# Patient Record
Sex: Female | Born: 1960 | Race: Black or African American | Hispanic: No | Marital: Single | Smoking: Current some day smoker
Health system: Southern US, Community
[De-identification: ages and names within clinical notes are randomized; demographics above are authoritative.]

## PROBLEM LIST (undated history)

## (undated) DIAGNOSIS — I10 Essential (primary) hypertension: Secondary | ICD-10-CM

## (undated) DIAGNOSIS — J40 Bronchitis, not specified as acute or chronic: Secondary | ICD-10-CM

## (undated) DIAGNOSIS — R51 Headache: Secondary | ICD-10-CM

## (undated) DIAGNOSIS — J449 Chronic obstructive pulmonary disease, unspecified: Secondary | ICD-10-CM

## (undated) DIAGNOSIS — K649 Unspecified hemorrhoids: Secondary | ICD-10-CM

## (undated) DIAGNOSIS — F329 Major depressive disorder, single episode, unspecified: Secondary | ICD-10-CM

## (undated) DIAGNOSIS — F319 Bipolar disorder, unspecified: Secondary | ICD-10-CM

## (undated) DIAGNOSIS — F1410 Cocaine abuse, uncomplicated: Secondary | ICD-10-CM

## (undated) DIAGNOSIS — B1920 Unspecified viral hepatitis C without hepatic coma: Secondary | ICD-10-CM

## (undated) DIAGNOSIS — F32A Depression, unspecified: Secondary | ICD-10-CM

## (undated) DIAGNOSIS — R519 Headache, unspecified: Secondary | ICD-10-CM

## (undated) HISTORY — PX: OTHER SURGICAL HISTORY: SHX169

## (undated) HISTORY — DX: Unspecified hemorrhoids: K64.9

---

## 2001-07-25 ENCOUNTER — Observation Stay (HOSPITAL_COMMUNITY): Admission: EM | Admit: 2001-07-25 | Discharge: 2001-07-26 | Payer: Self-pay | Admitting: Internal Medicine

## 2002-05-28 ENCOUNTER — Emergency Department (HOSPITAL_COMMUNITY): Admission: EM | Admit: 2002-05-28 | Discharge: 2002-05-28 | Payer: Self-pay | Admitting: *Deleted

## 2003-03-23 ENCOUNTER — Inpatient Hospital Stay (HOSPITAL_COMMUNITY): Admission: EM | Admit: 2003-03-23 | Discharge: 2003-03-25 | Payer: Self-pay | Admitting: Emergency Medicine

## 2003-03-24 ENCOUNTER — Encounter: Payer: Self-pay | Admitting: *Deleted

## 2003-08-17 ENCOUNTER — Emergency Department (HOSPITAL_COMMUNITY): Admission: EM | Admit: 2003-08-17 | Discharge: 2003-08-17 | Payer: Self-pay | Admitting: Emergency Medicine

## 2003-10-19 ENCOUNTER — Emergency Department (HOSPITAL_COMMUNITY): Admission: EM | Admit: 2003-10-19 | Discharge: 2003-10-19 | Payer: Self-pay | Admitting: Emergency Medicine

## 2004-03-03 ENCOUNTER — Ambulatory Visit (HOSPITAL_COMMUNITY): Admission: AD | Admit: 2004-03-03 | Discharge: 2004-03-03 | Payer: Self-pay | Admitting: Family Medicine

## 2004-03-04 ENCOUNTER — Ambulatory Visit (HOSPITAL_COMMUNITY): Admission: EM | Admit: 2004-03-04 | Discharge: 2004-03-04 | Payer: Self-pay | Admitting: Emergency Medicine

## 2004-04-04 ENCOUNTER — Ambulatory Visit (HOSPITAL_COMMUNITY): Admission: RE | Admit: 2004-04-04 | Discharge: 2004-04-04 | Payer: Self-pay | Admitting: Obstetrics and Gynecology

## 2004-07-18 ENCOUNTER — Emergency Department (HOSPITAL_COMMUNITY): Admission: EM | Admit: 2004-07-18 | Discharge: 2004-07-18 | Payer: Self-pay | Admitting: Emergency Medicine

## 2007-07-14 ENCOUNTER — Emergency Department (HOSPITAL_COMMUNITY): Admission: EM | Admit: 2007-07-14 | Discharge: 2007-07-14 | Payer: Self-pay | Admitting: Emergency Medicine

## 2008-02-05 ENCOUNTER — Emergency Department (HOSPITAL_COMMUNITY): Admission: EM | Admit: 2008-02-05 | Discharge: 2008-02-05 | Payer: Self-pay | Admitting: Emergency Medicine

## 2008-04-05 ENCOUNTER — Emergency Department (HOSPITAL_COMMUNITY): Admission: EM | Admit: 2008-04-05 | Discharge: 2008-04-05 | Payer: Self-pay | Admitting: Emergency Medicine

## 2008-04-08 ENCOUNTER — Emergency Department (HOSPITAL_COMMUNITY): Admission: EM | Admit: 2008-04-08 | Discharge: 2008-04-08 | Payer: Self-pay | Admitting: Emergency Medicine

## 2008-04-12 ENCOUNTER — Emergency Department (HOSPITAL_COMMUNITY): Admission: EM | Admit: 2008-04-12 | Discharge: 2008-04-12 | Payer: Self-pay | Admitting: Emergency Medicine

## 2008-04-15 ENCOUNTER — Emergency Department (HOSPITAL_COMMUNITY): Admission: EM | Admit: 2008-04-15 | Discharge: 2008-04-15 | Payer: Self-pay | Admitting: Emergency Medicine

## 2008-08-09 ENCOUNTER — Emergency Department (HOSPITAL_COMMUNITY): Admission: EM | Admit: 2008-08-09 | Discharge: 2008-08-09 | Payer: Self-pay | Admitting: Emergency Medicine

## 2009-03-28 ENCOUNTER — Emergency Department (HOSPITAL_COMMUNITY): Admission: EM | Admit: 2009-03-28 | Discharge: 2009-03-28 | Payer: Self-pay | Admitting: Emergency Medicine

## 2010-07-27 ENCOUNTER — Emergency Department (HOSPITAL_COMMUNITY): Admission: EM | Admit: 2010-07-27 | Discharge: 2010-07-27 | Payer: Self-pay | Admitting: Emergency Medicine

## 2011-02-03 LAB — URINE MICROSCOPIC-ADD ON

## 2011-02-03 LAB — URINE CULTURE

## 2011-02-03 LAB — URINALYSIS, ROUTINE W REFLEX MICROSCOPIC
Bilirubin Urine: NEGATIVE
Glucose, UA: NEGATIVE mg/dL
Ketones, ur: NEGATIVE mg/dL
Protein, ur: 30 mg/dL — AB

## 2011-03-01 LAB — URINALYSIS, ROUTINE W REFLEX MICROSCOPIC
Glucose, UA: NEGATIVE mg/dL
Protein, ur: NEGATIVE mg/dL
Specific Gravity, Urine: 1.025 (ref 1.005–1.030)
pH: 5.5 (ref 5.0–8.0)

## 2011-03-01 LAB — URINE MICROSCOPIC-ADD ON

## 2011-04-08 NOTE — H&P (Signed)
NAME:  Becky [name], Becky [name]                          ACCOUNT NO.:  [id number]   MEDICAL RECORD NO.:  [id number]                   PATIENT TYPE:  INP   LOCATION:  A339                                 FACILITY:  [facility]   PHYSICIAN:  Sarita Bottom, M.D.                  DATE OF BIRTH:  Jan 04, 1961   DATE OF ADMISSION:  03/22/2003  DATE OF DISCHARGE:                                HISTORY & PHYSICAL   PRIMARY CARE PHYSICIAN:  Mila Homer. Sudie Bailey, M.D.   CHIEF COMPLAINT:  I feel weak and I've been throwing up.   HISTORY OF PRESENT ILLNESS:  The patient is a 50 year old female with no  significant medical illness was well until about two days ago when she  developed an insidious onset of malaise, fevers, and chills.  She also  vomited about six times yesterday.  Vomitus was nonbloody, bilious in  nature, contained previously eaten food.  She therefore came to the  emergency room for evaluation and treatment.   REVIEW OF SYSTEMS:  She admits to a fever of 105.  Denies any weight loss.  General malaise.  RESPIRATORY:  She admits to on and off cough productive of  greenish sputum.  She has on and off shortness of breath.  CVS:  She has no  chest pain or palpitations.  GI:  Admits to nausea, vomiting.  Denies any  diarrhea.  GENITOURINARY:  She denies any dysuria.  PSYCHIATRIC:  Denies any  depression.   PAST MEDICAL HISTORY:  She has no chronic medical illness.  She has had an  HIV test that was negative four months ago when she was having antenatal  care.  PPD was also negative a few months ago.   SOCIAL HISTORY:  She is single.  She has two children.  She has a boyfriend.  She smokes about 1-2 cigarettes a day.  Denies alcohol abuse.   FAMILY HISTORY:  Significant for heart disease in her mother who died from a  heart attack at the age of 69.   PHYSICAL EXAMINATION:  GENERAL:  She is a young lady, ill looking but not in  any apparent distress.  VITAL SIGNS:  Temperature 104.  HEENT:  She  is not pale.  She is anicteric.  Oral mucosa is dry.  NECK:  Supple.  No jugular venous distention, no lymphadenopathy.  CHEST:  Air entry is good bilaterally.  Breath sounds are vesicular.  CARDIOVASCULAR:  Heart sounds 1 and 2 are normal.  Rhythm is regular.  No  murmurs were heard.  ABDOMEN:  Soft.  Bowel sounds are present.  The right renal angle is tender  to palpation.  CENTRAL NERVOUS SYSTEM:  She is alert and oriented x3.  She has no focal  deficits.  EXTREMITIES:  She has no edema.   LABORATORY DATA AND DIAGNOSTICS:  WBC is 14.3, neutrophil count is 70%,  lymphocytes are  4%, hemoglobin is 13.4, hematocrit is 38.9, MCV is 87.4,  platelet count is 165.  Her UA shows a specific gravity of 1010.  Urine is  yellow, pH is 6.5, nitrite and leukocyte esterase are both positive.  She  has bacteria and pyuria.  Sodium is 134, potassium is 3.2, chloride is 102,  CO2 is 25, BUN is 6, creatinine is 0.8, glucose is 128, calcium is 8.2.  Liver function tests show a total protein of 6.3, SGOT of 50, SGPT of 61,  total bilirubin of 1.1.   ASSESSMENT AND PLAN:  1. Urinary tract infection with possible pyelonephritis.  Blood cultures and     urine cultures will be done.  Schedule the patient for a renal     ultrasonography.  The patient will be given intravenous Levaquin 500     daily and acetaminophen 500 q.6h. p.r.n. for fever and pain.  2. Nausea and vomiting.  The patient will be given Phenergan 12.5 mg q.6h.     She will also be given intravenous hydration, normal saline at 100 mL per     hour.  3. Hypokalemia.  This will be replenished with intravenous KCl 10 mEq x4     doses.  Her BMET will be monitored after replenishing potassium.  4. Elevated transaminases--question significance.  The patient's viral     hepatitis profile will be sent at this time and transaminases will be     monitored.   The patient to be admitted under the care of primary M.D., Dr. Sudie Bailey.  Further workup and  management will depend on clinical course.                                               Sarita Bottom, M.D.    DW/MEDQ  D:  03/23/2003  T:  03/23/2003  Job:  [id number]

## 2011-04-08 NOTE — Group Therapy Note (Signed)
   NAME:  Becky [name], Becky [name]                          ACCOUNT NO.:  [id number]   MEDICAL RECORD NO.:  [id number]                   PATIENT TYPE:  INP   LOCATION:  A339                                 FACILITY:  [facility]   PHYSICIAN:  Mila Homer. Sudie Bailey, M.D.           DATE OF BIRTH:  [birth year]   DATE OF PROCEDURE:  DATE OF DISCHARGE:                                   PROGRESS NOTE   SUBJECTIVE:  This 50 year old was admitted last night with presumptive  pyelonephritis. She tells me this morning that she has been having fever and  vomiting for a week.  She is still having pain in the right back.   OBJECTIVE:  Temperature 104.7, pulse 89, respiratory rate 20, blood pressure  138/79.  She appears to be oriented and alert, in distress due to the back  pain and the nausea and vomiting. She is well-developed and well-nourished.  Lungs are clear throughout.  Heart is irregular with a rate of about 70, in  my examine.  Abdomen is soft without hepatosplenomegaly or masses. There is  some tenderness in the right upper quadrant with some right flank and CVA  pain; none in the left.  Skin turgor appears to be normal and there is no  edema of the ankles.   Review of her lab work showed last night on admission the white cell count  was 14,300 with 12 bands, and 70 neutrophils.  Today the MET-7 showed a  potassium of 3.8, up from 3.2 on admission last night, after IV potassium.  Glucose is 123.   ASSESSMENT:  1. Presumptive pyelonephritis involving the right kidney.  2. Hypokalemia, improved.  3. Mild dehydration, improved.   PLAN:  Continue the Levaquin 500 mg IV q.24h.  Add at least 1 dose of  Rocephin 1 gm IV while waiting for urine cultured (cover proteus, etc.).  Continue with Tylenol 650 q.4h. but add ibuprofen 800 mg q.8h. to get the  fever down.                                               Mila Homer. Sudie Bailey, M.D.    SDK/MEDQ  D:  03/23/2003  T:  03/23/2003  Job:  [id number]

## 2011-04-08 NOTE — Discharge Summary (Signed)
   NAME:  Becky [name], Becky [name]                          ACCOUNT NO.:  [id number]   MEDICAL RECORD NO.:  [id number]                   PATIENT TYPE:  INP   LOCATION:  A339                                 FACILITY:  [facility]   PHYSICIAN:  Mila Homer. Sudie Bailey, M.D.           DATE OF BIRTH:  31-Jul-1961   DATE OF ADMISSION:  03/23/2003  DATE OF DISCHARGE:  03/25/2003                                 DISCHARGE SUMMARY   HISTORY OF PRESENT ILLNESS:  This 50 year old woman was admitted to the  hospital with pyelonephritis.  She had a benign three day hospitalization  extending from May 2 through Mar 25, 2003.  Vital signs remained stable but  initially temperature got as high as 105 before defervescing by her second  day.   Her admission white cell count is 14,300, rechecked 8300 her third day.  Her  H&H was 13.2/38.9, rechecked 11.4/34.3 on IV fluids.  She had initial sodium  of 134, recheck 135.  Potassium 3.2, recheck 3.8, glucose 128, recheck 123  and 111.  Her AST was 50 and ALT 61 on admission and three days later SGPT  was down to 42.  Albumin was 2.1 on IV fluids down from 3.0 on admission.   Her UA showed 7-10 wbc's, 0-2 rbc's, and many bacteria.  The stick showed  urine to be positive for nitrites, trace leukocytes.  Blood cultures x2 were  negative.  Urine culture was apparently not done.   Treatment included Levaquin 500 mg IV daily, Phenergan 12.5 mg q.6h. p.r.n.  nausea, IV normal saline at 125 mL/hour with KCl 10 mEq four doses by IV.  Later her potassium switched to 40 mEq KCl.  When she stayed febrile she was  also put on Rocephin 1 g IV for a one time dose.  She is on Robitussin two  teaspoons q.4h. for cough.   She did well on this regimen her second day, much better, and by third day  she was ready for discharge home.   She is discharged home on Levaquin 500 mg daily (sample which she is going  to get from our office).  She is to have follow-up with [place] within the week.   FINAL  DISCHARGE DIAGNOSES:  Pyelonephritis.                                               Mila Homer. Sudie Bailey, M.D.    SDK/MEDQ  D:  03/25/2003  T:  03/25/2003  Job:  [id number]

## 2011-04-08 NOTE — Discharge Summary (Signed)
NAME:  Becky [name], Becky [name]                          ACCOUNT NO.:  [id number]   MEDICAL RECORD NO.:  [id number]                   PATIENT TYPE:  OIB   LOCATION:  A415                                 FACILITY:  [facility]   PHYSICIAN:  Langley Gauss, M.D.                DATE OF BIRTH:  1961-05-06   DATE OF ADMISSION:  04/04/2004  DATE OF DISCHARGE:  04/04/2004                                 DISCHARGE SUMMARY   HISTORY OF PRESENT ILLNESS:  The patient is a 50 year old, gravida 5, para 2-  0-0-2, who is a 33-3/[redacted] weeks gestation based on a 28-4/7 weeks ultrasound,  who is transferred from [facility] to [place] with findings of  preterm premature spontaneous rupture of membranes at 2000 hours on Apr 03, 2004.  The patient gives a history of getting up to use the bathroom last  night and then having fluid run down her legs.  She is absolutely certain  she did not pee on herself at that time as the fluid leakage continued  throughout the evening.  She is again at the time of evaluation here noted  to have fluid running down her leg.  The patient states that she did not  come to the hospital last night as she was afraid.  She presented today only  upon her family's insistence.  Her prenatal course is complicated by  findings of insufficient and late prenatal care, as well as cocaine use.  The patient has had prenatal care only through labor and delivery visits.  She initially presented to [facility] on March 03, 2004,  complaining of abdominal pain.  At that time, she was an OB unassigned  patient.  She became irritated in that she was not allowed to eat, so she  let against medical advice.  She apparently did test positive for cocaine  and THC on that visit.  The patient subsequently presented the very  following day on March 04, 2004, again complaining of abdominal pain.  Limited workup at that time revealed intrauterine pregnancy at 28-4/[redacted] weeks  gestation per the [facility]  Radiology Department.  In addition, laboratory  studies revealed rubella immune.  Hepatitis B and HIV are negative.  RPR was  reactive with a titer of 1:4.  O positive blood type.  The patient was  determined not to be in labor.  She had a reactive nonstress test.  She was  discharged from the hospital on March 04, 2004, and given explicit  instructions to follow up for outpatient care at my office within the next  week.  The patient failed to keep any additional appointments and presented  today only, Apr 04, 2004, to [facility].   PAST MEDICAL HISTORY:  The patient has two prior vaginal deliveries in 1997  at [facility].  She also delivered 15 months ago at [facility].  At  that time, she was in a cocaine rehabilitation program.  The  patient has a previous 18-week pregnancy loss with spontaneous rupture of  membranes on July 25, 2001.  She subsequently had evacuation of  uterine  contents vaginally of this 18-week abortus.  She was recently hospitalized  on Mar 23, 2003, for pyelonephritis.  The patient has had long-term struggle  with cocaine use, testing positive for cocaine during just about every  evaluation and admission to labor and delivery.  Frequently she will smoke a  combination of marijuana and cocaine.  She also frequently ingests crack  cocaine-containing products.   CURRENT MEDICATIONS:  None other than recreational drugs.   ALLERGIES:  She has no known drug allergies.   PAST SURGICAL HISTORY:  Negative.   PHYSICAL EXAMINATION:  VITAL SIGNS:  Temperature 98.8 degrees, pulse 78,  respiratory rate 18, blood pressure 116/73.  HEENT:  Negative with no adenopathy.  Extremely poor dentition.  NECK:  Supple.  GENERAL APPEARANCE:  She appears much greater than her stated age.  LUNGS:  Clear.  CARDIOVASCULAR:  Regular rate and rhythm.  ABDOMEN:  Soft.  Gravid uterus identified with a fundal height of 30 cm.  EXTREMITIES:  Normal.  PELVIC:  No  evidence of leaking fluid.  A sterile speculum examination is  performed, which does reveal some pooling within the posterior vaginal  fornix.  Cervix noted to be visually not dilated.  Digital examination not  performed.  Limited OB ultrasound at greater than [redacted] weeks gestation per  Langley Gauss, M.D., reveals markedly decreased amniotic fluid.  No pockets  of fluid are identified that do not contain umbilical cord.  Good fetal  movement is identified.  Fetal cardiac activity is seen.  The infant is  noted to be in a breech presentation with the buttocks apparently well  applied to the cervix.  The placenta is noted to be posterior fundal in  position.  Anatomic survey is not performed.   LABORATORY STUDIES:  Urine drug screen is currently pending.  External fetal  monitor reveals reassuring fetal heart rate with fetal heart rate baseline  at 150.  Accelerations are noted.  No fetal heart rate decelerations are  noted.  Long-term variability is noted to be normal.  There is no evidence  of any uterine activity identified.   ASSESSMENT:  1. 33-3/7 weeks intrauterine pregnancy.  2. Breech presentation.  3. Preterm premature spontaneous rupture of membranes prior to the onset of     labor.  4. Noted to be in a breech presentation.  5. History of inadequate prenatal care.  6. Has tested positive for cocaine previously on March 04, 2004.  Testing in     progress at this time, but it would not be surprising if the patient     again tested positive for cocaine.   Due to the prematurity, it would be adventitious for a level 3 nursery  facility to be available.  Thus, arrangement is made for transfer to  [facility].  The patient is to be treated with 2 g of IV ampicillin due to the  high risk of group B Streptococcus.  In addition, she is given 12 mg of IV  betamethasone in an effort to enhance fetal lung maturity.  Transfer  arrangements are made with Dr. Sibyl Parr at [facility].    ___________________________________________  Langley Gauss, M.D.   DC/MEDQ  D:  04/04/2004  T:  04/04/2004  Job:  [id number]   cc:   [facility] Labor and Delivery  Fax [phone]

## 2011-04-08 NOTE — Discharge Summary (Signed)
   NAME:  Becky [name], Becky [name]                          ACCOUNT NO.:  [id number]   MEDICAL RECORD NO.:  [id number]                   PATIENT TYPE:  INP   LOCATION:  A339                                 FACILITY:  [facility]   PHYSICIAN:  Mila Homer. Sudie Bailey, M.D.           DATE OF BIRTH:  04-23-1961   DATE OF ADMISSION:  03/22/2003  DATE OF DISCHARGE:  03/25/2003                                 DISCHARGE SUMMARY   ADDENDUM:   DIAGNOSES:  1. Pyelonephritis.  2. Electrolyte abnormalities, including hyponatremia and hypokalemia.  3. Abnormalities of liver function tests.  4. Anemia.   LABORATORY DATA:  Renal ultrasound was negative.                                               Mila Homer. Sudie Bailey, M.D.    SDK/MEDQ  D:  03/25/2003  T:  03/25/2003  Job:  [id number]

## 2011-04-08 NOTE — Group Therapy Note (Signed)
   NAME:  Becky [name], Becky [name]                          ACCOUNT NO.:  [id number]   MEDICAL RECORD NO.:  [id number]                   PATIENT TYPE:  INP   LOCATION:  A339                                 FACILITY:  [facility]   PHYSICIAN:  Mila Homer. Sudie Bailey, M.D.           DATE OF BIRTH:  Oct 01, 1961   DATE OF PROCEDURE:  DATE OF DISCHARGE:                                   PROGRESS NOTE   SUBJECTIVE:  She says she feels bad today.  She has been eating.   OBJECTIVE:  Temperature 97.5, pulse 68, respirations 20, blood pressure  133/82.  She is semirecumbent in bed.  No acute distress.  Well-developed,  well-nourished.  Mucous membranes are moist.  Heart has regular rhythm at  rate of 70.  Lungs are clear throughout.  She is moving air well.  The  abdomen is soft without hepatosplenomegaly or mass.  She does have bilateral  CV and flank tenderness and palpation, however.  There is no edema to the  ankles.  Renal ultrasound done this morning was negative.  Serum pregnancy  test was negative.  Blood cultures x2 at this time are negative.   ASSESSMENT:  1. Pyelonephritis.  2. Hypokalemia which is now resolved (Last potassium 3.8 up from 3.2.).  3. LFTs possibly secondary to ETOH.   PLAN:  Urine culture pending.  Hepatitis C by PCR pending.  Repeat CBC  tomorrow.  If afebrile, with normal white cell count and free of CV and  flank pain, may be discharged.                                               Mila Homer. Sudie Bailey, M.D.    SDK/MEDQ  D:  03/24/2003  T:  03/25/2003  Job:  [id number]

## 2011-04-08 NOTE — Discharge Summary (Signed)
NAME:  Becky [name], Becky [name]                          ACCOUNT NO.:  [id number]   MEDICAL RECORD NO.:  [id number]                   PATIENT TYPE:  OIB   LOCATION:  A428                                 FACILITY:  [facility]   PHYSICIAN:  Langley Gauss, M.D.                DATE OF BIRTH:  09/22/1961   DATE OF ADMISSION:  03/04/2004  DATE OF DISCHARGE:  03/04/2004                                 DISCHARGE SUMMARY   The patient is a 50 year old multiparous black female who presents to [facility] emergency room with no prenatal care to date complaining of bleeding  which she feels is secondary to a hemorrhoid. The patient states she has  prior history of hemorrhoid with intermittent bleeding as a result of this.  However, she denies any prior history of any thrombosed hemorrhoids. She has  never had any surgical procedure performed. The patient was aware that she  was about four or five months pregnant. She denies any vaginal bleeding, any  uterine contractions. She does experience good fetal movement. The patient  has had no prenatal care to date. She has been seen at the health department  times one. She did present to [facility] the day previously on  March 03, 2004, at which time she left against medical advice. She is known  to be gravida 4, para 2. She has two prior vaginal deliveries with living  children.   The patient is somewhat of an unreliable historian not always truthful in  providing answers. She denies any prior history of PID or sexually  transmitted diseases. She has had two vaginal deliveries. She has had two  prior spontaneous ABs. Her last menstrual period is unknown. She states she  has no known drug allergies. She states current medications are none.  Then  later she says she is taking ABC+ multivitamin.  Medical history is  otherwise negative.   SOCIAL HISTORY:  The patient is noted to be a known cocaine user, though she  did deny drug or alcohol use upon  presentation on today's visit. I did care  for patient during her last pregnancy at which time she was known to  initially test positive for cocaine, but after aggressive counseling, the  patient subsequently did remain negative for cocaine throughout the  remainder of the pregnancy. When notified the patient that she did test  positive for cocaine on this presentation, the patient again denies cocaine  usage, then provides the story that one day previously a visitor had come  with a blunt, typically a marijuana and nicotine containing cigarette, she  then shared this with the visitor's husband then supposedly was totally  unaware that it was cocaine containing. The patient does state at this point  in time that she is very embarrassed regarding the positive cocaine screen  and states she will not use cocaine during the remainder of the pregnancy.  She  does admit to frequent marijuana usage and did test positive for THC.   On physical examination she is noted to be greater than stated age of 50  years old, temperature 97.8, blood pressure 124/79, heart rate 71,  respiratory rate 20.  HEENT: Mucous membranes are moist.  NECK: Supple. Thyroid not palpable.  LUNGS: Diffuse rales which clear with coughing.  CARDIOVASCULAR: Regular rate and rhythm.  ABDOMEN: Soft and nontender. Gravid uterus identified with a fundal height  of 29 cm.  EXTREMITIES: Normal.  PELVIC EXAM: Normal external genitalia. No lesions or ulcerations  identified. Cervix is noted to be closed. No vaginal bleeding or discharge  is identified.  RECTAL EXAM: Evaluation of the rectal area reveals no active bleeding, but  there is evidence of nonthrombosed hemorrhoids.   LABORATORY STUDIES:  Ultrasound was ordered by the emergency room physician  and performed in the department of the radiology, and preliminary report was  29-week intrauterine pregnancy. Prenatal profile and HIV are also requested.  Results available at  this time reveal O positive blood type, negative  antibody screen. Urinalysis reveals only a small leukocyte esterase, a few  bacteria, hemoglobin 11.8, hematocrit 35.0, MCV 84.9, white count 8.0.  Urine drug screen is positive for cocaine and positive for THC. External  fetal monitor reveals fetal heart rate baseline in the 150s, normal long-  term variability for [redacted] weeks gestation. No fetal heart rate decelerations  are noted and a one and one-hour fetal monitor strip for uterine  contractions only are identified with no pattern of uterine activity.   ASSESSMENT:  1. 29 week intrauterine pregnancy.  2. Insufficiency prenatal care.  3. History of substance abuse, specifically cocaine and marijuana.  4. Abdominal pain secondary to preterm labor.   DISPOSITION:  The patient is advised to follow up in the office in one weeks  time for initiation of prenatal care. Laboratory studies to be forwarded to  the office as available. The patient will continue with ABC + vitamin. In  addition, she is counseled extensively regarding the risks of cocaine usage  during pregnancy.     ___________________________________________                                         Langley Gauss, M.D.   DC/MEDQ  D:  03/04/2004  T:  03/04/2004  Job:  [id number]

## 2011-05-24 ENCOUNTER — Emergency Department (HOSPITAL_COMMUNITY)
Admission: EM | Admit: 2011-05-24 | Discharge: 2011-05-24 | Disposition: A | Discharge status: Home / Self Care | Payer: Self-pay | Attending: Emergency Medicine | Admitting: Emergency Medicine

## 2011-05-24 DIAGNOSIS — R51 Headache: Secondary | ICD-10-CM | POA: Insufficient documentation

## 2011-05-24 DIAGNOSIS — F1210 Cannabis abuse, uncomplicated: Secondary | ICD-10-CM | POA: Insufficient documentation

## 2011-05-24 DIAGNOSIS — I10 Essential (primary) hypertension: Secondary | ICD-10-CM | POA: Insufficient documentation

## 2011-05-24 DIAGNOSIS — R110 Nausea: Secondary | ICD-10-CM | POA: Insufficient documentation

## 2011-05-24 DIAGNOSIS — R42 Dizziness and giddiness: Secondary | ICD-10-CM | POA: Insufficient documentation

## 2011-05-24 LAB — COMPREHENSIVE METABOLIC PANEL
ALT: 25 U/L (ref 0–35)
AST: 22 U/L (ref 0–37)
Albumin: 4.3 g/dL (ref 3.5–5.2)
CO2: 20 mEq/L (ref 19–32)
Chloride: 105 mEq/L (ref 96–112)
Creatinine, Ser: 0.86 mg/dL (ref 0.50–1.10)
Sodium: 137 mEq/L (ref 135–145)
Total Bilirubin: 0.7 mg/dL (ref 0.3–1.2)

## 2011-05-24 LAB — CBC
MCV: 85.3 fL (ref 78.0–100.0)
Platelets: 232 10*3/uL (ref 150–400)
RBC: 5.16 MIL/uL — ABNORMAL HIGH (ref 3.87–5.11)
RDW: 13.4 % (ref 11.5–15.5)
WBC: 7.4 10*3/uL (ref 4.0–10.5)

## 2011-05-24 LAB — DIFFERENTIAL
Basophils Absolute: 0.1 10*3/uL (ref 0.0–0.1)
Basophils Relative: 1 % (ref 0–1)
Eosinophils Absolute: 0.1 10*3/uL (ref 0.0–0.7)
Eosinophils Relative: 1 % (ref 0–5)
Lymphs Abs: 3.3 10*3/uL (ref 0.7–4.0)
Neutrophils Relative %: 45 % (ref 43–77)

## 2011-08-17 LAB — URINALYSIS, ROUTINE W REFLEX MICROSCOPIC
Glucose, UA: NEGATIVE
Hgb urine dipstick: NEGATIVE
Ketones, ur: NEGATIVE
pH: 5.5

## 2011-08-17 LAB — CBC
HCT: 40.5
Hemoglobin: 14
MCHC: 34.7
MCV: 84.3
RBC: 4.8

## 2011-08-17 LAB — URINE MICROSCOPIC-ADD ON

## 2011-08-17 LAB — BASIC METABOLIC PANEL
CO2: 22
Chloride: 111
GFR calc Af Amer: >60
Potassium: 4
Sodium: 137

## 2011-08-17 LAB — DIFFERENTIAL
Basophils Relative: 0
Eosinophils Absolute: 0.1
Eosinophils Relative: 1
Monocytes Absolute: 0.5
Monocytes Relative: 9
Neutro Abs: 3.5

## 2011-08-17 LAB — URINE CULTURE

## 2011-08-22 LAB — URINALYSIS, ROUTINE W REFLEX MICROSCOPIC
Glucose, UA: NEGATIVE
Protein, ur: NEGATIVE

## 2011-08-22 LAB — URINE MICROSCOPIC-ADD ON

## 2012-09-12 ENCOUNTER — Emergency Department (HOSPITAL_COMMUNITY)
Admission: EM | Admit: 2012-09-12 | Discharge: 2012-09-12 | Disposition: A | Discharge status: Home / Self Care | Payer: Self-pay | Attending: Emergency Medicine | Admitting: Emergency Medicine

## 2012-09-12 ENCOUNTER — Emergency Department (HOSPITAL_COMMUNITY): Payer: Self-pay

## 2012-09-12 ENCOUNTER — Encounter (HOSPITAL_COMMUNITY): Payer: Self-pay | Admitting: *Deleted

## 2012-09-12 DIAGNOSIS — I10 Essential (primary) hypertension: Secondary | ICD-10-CM | POA: Insufficient documentation

## 2012-09-12 DIAGNOSIS — K5900 Constipation, unspecified: Secondary | ICD-10-CM | POA: Insufficient documentation

## 2012-09-12 DIAGNOSIS — R110 Nausea: Secondary | ICD-10-CM | POA: Insufficient documentation

## 2012-09-12 DIAGNOSIS — N390 Urinary tract infection, site not specified: Secondary | ICD-10-CM | POA: Insufficient documentation

## 2012-09-12 DIAGNOSIS — R1013 Epigastric pain: Secondary | ICD-10-CM | POA: Insufficient documentation

## 2012-09-12 DIAGNOSIS — F17200 Nicotine dependence, unspecified, uncomplicated: Secondary | ICD-10-CM | POA: Insufficient documentation

## 2012-09-12 DIAGNOSIS — R109 Unspecified abdominal pain: Secondary | ICD-10-CM

## 2012-09-12 DIAGNOSIS — Z79899 Other long term (current) drug therapy: Secondary | ICD-10-CM | POA: Insufficient documentation

## 2012-09-12 HISTORY — DX: Essential (primary) hypertension: I10

## 2012-09-12 LAB — URINALYSIS, ROUTINE W REFLEX MICROSCOPIC
Ketones, ur: NEGATIVE mg/dL
Leukocytes, UA: NEGATIVE
Nitrite: POSITIVE — AB
Protein, ur: NEGATIVE mg/dL
pH: 6 (ref 5.0–8.0)

## 2012-09-12 LAB — COMPREHENSIVE METABOLIC PANEL
AST: 24 U/L (ref 0–37)
Albumin: 4 g/dL (ref 3.5–5.2)
Alkaline Phosphatase: 75 U/L (ref 39–117)
Chloride: 106 mEq/L (ref 96–112)
Potassium: 4.6 mEq/L (ref 3.5–5.1)
Sodium: 140 mEq/L (ref 135–145)
Total Bilirubin: 0.5 mg/dL (ref 0.3–1.2)

## 2012-09-12 LAB — CBC WITH DIFFERENTIAL/PLATELET
Basophils Absolute: 0 10*3/uL (ref 0.0–0.1)
Basophils Relative: 0 % (ref 0–1)
Hemoglobin: 15.1 g/dL — ABNORMAL HIGH (ref 12.0–15.0)
MCHC: 33.9 g/dL (ref 30.0–36.0)
Neutro Abs: 2.5 10*3/uL (ref 1.7–7.7)
Neutrophils Relative %: 43 % (ref 43–77)
Platelets: 213 10*3/uL (ref 150–400)
RDW: 13.5 % (ref 11.5–15.5)

## 2012-09-12 LAB — URINE MICROSCOPIC-ADD ON

## 2012-09-12 MED ORDER — HYDROCODONE-ACETAMINOPHEN 5-325 MG PO TABS: 1.0000 | ORAL_TABLET | ORAL | Status: AC | PRN

## 2012-09-12 MED ORDER — CEPHALEXIN 500 MG PO CAPS: 500.0000 mg | ORAL_CAPSULE | Freq: Four times a day (QID) | ORAL | Status: DC

## 2012-09-12 MED ORDER — ONDANSETRON HCL 4 MG/2ML IJ SOLN
4.0000 mg | Freq: Once | INTRAMUSCULAR | Status: AC
  Administered 2012-09-12: 4 mg via INTRAVENOUS
  Filled 2012-09-12: qty 2

## 2012-09-12 MED ORDER — CEPHALEXIN 500 MG PO CAPS
500.0000 mg | ORAL_CAPSULE | Freq: Once | ORAL | Status: AC
  Administered 2012-09-12: 500 mg via ORAL
  Filled 2012-09-12: qty 1

## 2012-09-12 MED ORDER — SODIUM CHLORIDE 0.9 % IV SOLN
INTRAVENOUS | Status: DC
  Administered 2012-09-12: 11:00:00 via INTRAVENOUS

## 2012-09-12 MED ORDER — PROMETHAZINE HCL 25 MG PO TABS: 25.0000 mg | ORAL_TABLET | Freq: Four times a day (QID) | ORAL | Status: DC | PRN

## 2012-09-12 MED ORDER — GI COCKTAIL ~~LOC~~
30.0000 mL | Freq: Once | ORAL | Status: AC
  Administered 2012-09-12: 30 mL via ORAL
  Filled 2012-09-12: qty 30

## 2012-09-12 MED ORDER — MORPHINE SULFATE 4 MG/ML IJ SOLN
4.0000 mg | Freq: Once | INTRAMUSCULAR | Status: AC
  Administered 2012-09-12: 4 mg via INTRAVENOUS
  Filled 2012-09-12: qty 1

## 2012-09-12 NOTE — ED Notes (Signed)
Pt c/o epigastric pain that radiates down abd area, associated with n/v, pt states that the pain comes and goes, started this am,

## 2012-09-12 NOTE — ED Provider Notes (Signed)
History     CSN: [id number]  Arrival date & time 09/12/12  1032   First MD Initiated Contact with Patient 09/12/12 1035      Chief Complaint  Patient presents with  . Abdominal Pain    (Consider location/radiation/quality/duration/timing/severity/associated sxs/prior treatment) HPI Comments: Becky [name] presents with epigastric pain which radiates into her umbilicus and started approximately 4 hours before arrival as she was sitting in the waiting area in another area of the hospital.  She has had waves of nausea in association with sharp intermittent pain which has progressed to several episodes of vomiting prior to arrival.  She denies fevers or chills and has had no diarrhea, does report occasional constipation had a normal bowel movement yesterday.  She has had no by mouth intake today, but denies anorexia, stating she generally skips breakfast.  She has found no alleviators for her pain or aggravators.  She denies chest pain and shortness of breath.  She has had no recent illnesses.  The history is provided by the patient.    Past Medical History  Diagnosis Date  . Hypertension     History reviewed. No pertinent past surgical history.  No family history on file.  History  Substance Use Topics  . Smoking status: Current Some Day Smoker  . Smokeless tobacco: Not on file  . Alcohol Use: Yes    OB History    Grav Para Term Preterm Abortions TAB SAB Ect Mult Living                  Review of Systems  Constitutional: Negative for fever and chills.  HENT: Negative for congestion, sore throat and neck pain.   Eyes: Negative.   Respiratory: Negative for chest tightness and shortness of breath.   Cardiovascular: Negative for chest pain.  Gastrointestinal: Positive for nausea, vomiting, abdominal pain and constipation. Negative for diarrhea and blood in stool.  Genitourinary: Negative.   Musculoskeletal: Negative for joint swelling and arthralgias.  Skin: Negative.   Negative for rash and wound.  Neurological: Negative for dizziness, weakness, light-headedness, numbness and headaches.  Hematological: Negative.   Psychiatric/Behavioral: Negative.     Allergies  Review of patient's allergies indicates no known allergies.  Home Medications   Current Outpatient Rx  Name Route Sig Dispense Refill  . CEPHALEXIN 500 MG PO CAPS Oral Take 1 capsule (500 mg total) by mouth 4 (four) times daily. 40 capsule 0  . HYDROCODONE-ACETAMINOPHEN 5-325 MG PO TABS Oral Take 1 tablet by mouth every 4 (four) hours as needed for pain. 10 tablet 0  . PROMETHAZINE HCL 25 MG PO TABS Oral Take 1 tablet (25 mg total) by mouth every 6 (six) hours as needed for nausea. 12 tablet 0    BP 185/109  Pulse 53  Temp 97.6 F (36.4 C) (Oral)  Resp 18  Ht 5\' 9"  (1.753 m)  Wt 175 lb (79.379 kg)  BMI 25.84 kg/m2  SpO2 100%  Physical Exam  Nursing note and vitals reviewed. Constitutional: She appears well-developed and well-nourished.  HENT:  Head: Normocephalic and atraumatic.  Eyes: Conjunctivae normal are normal.  Neck: Normal range of motion.  Cardiovascular: Normal rate, regular rhythm, normal heart sounds and intact distal pulses.   Pulmonary/Chest: Effort normal and breath sounds normal. She has no wheezes.  Abdominal: Soft. Bowel sounds are normal. She exhibits no distension. There is no hepatosplenomegaly. There is tenderness in the epigastric area. There is no rigidity, no rebound, no guarding, no CVA tenderness  and negative Murphy's sign.       Soft and easily reducible supraumbilical hernia which is not tender to palpation.  Musculoskeletal: Normal range of motion.  Neurological: She is alert.  Skin: Skin is warm and dry.  Psychiatric: She has a normal mood and affect.    ED Course  Procedures (including critical care time)  Labs Reviewed  URINALYSIS, ROUTINE W REFLEX MICROSCOPIC - Abnormal; Notable for the following:    Nitrite POSITIVE (*)     All other  components within normal limits  CBC WITH DIFFERENTIAL - Abnormal; Notable for the following:    RBC 5.18 (*)     Hemoglobin 15.1 (*)     All other components within normal limits  COMPREHENSIVE METABOLIC PANEL - Abnormal; Notable for the following:    GFR calc non Af Amer 81 (*)     All other components within normal limits  URINE MICROSCOPIC-ADD ON - Abnormal; Notable for the following:    Squamous Epithelial / LPF MANY (*)     Bacteria, UA MANY (*)     All other components within normal limits  LIPASE, BLOOD  URINE CULTURE   US Abdomen Limited Ruq  09/12/2012  *RADIOLOGY REPORT*  Clinical Data:  Epigastric pain  LIMITED ABDOMINAL ULTRASOUND - RIGHT UPPER QUADRANT  Comparison:  None.  Findings:  Gallbladder:  Normal gallbladder.  No gallstones or gallbladder wall thickening.  Common bile duct:  Normal common bile duct 2.2 mm  Liver:  Negative for mass lesion.  Echogenic liver with focal fatty sparing in the porta hepatis.  Hypoechoic structure in the midline above the umbilicus may represent a ventral hernia.  This increases with Valsalva.  IMPRESSION: Negative for gallstones.  Probable ventral hernia in the midline above the umbilicus.                    Original Report Authenticated By: Camelia Phenes, M.D.      1. Abdominal pain   2. Urinary tract infection       MDM  Labs an ultrasound reviewed prior to discharge home.  Patient denies any dysuria symptoms and urine culture has been requested.  Will start patient on Keflex 4 times a day.  She was also prescribed hydrocodone and Phenergan in small quantities, advised to use sparingly and return for any worsening or changing symptoms, including fevers or uncontrolled vomiting or worse pain.  Patient still had some mild epigastric discomfort prior arch home.  She was given a GI cocktail with plans to reassess her, however patient left immediately after receiving the GI cocktail without notifying ED staff.   Date: 09/12/2012  Rate:  52  Rhythm: sinus bradycardia  QRS Axis: normal  Intervals: QT prolonged  ST/T Wave abnormalities: normal  Conduction Disutrbances:none  Narrative Interpretation: unchanged from ekg dated 04/12/08 except t wave inversion V3 resolved.  Old EKG Reviewed: changes noted         Burgess Amor, [place] 09/12/12 1627

## 2012-09-12 NOTE — ED Notes (Signed)
Pt left w/out signing.

## 2012-09-12 NOTE — ED Notes (Signed)
MD at bedside. 

## 2012-09-14 LAB — URINE CULTURE: Colony Count: >=100000

## 2012-09-16 NOTE — ED Provider Notes (Signed)
Medical screening examination/treatment/procedure(s) were performed by non-physician practitioner and as supervising physician I was immediately available for consultation/collaboration.   Laray Anger, DO 09/16/12 [phone]

## 2012-09-25 ENCOUNTER — Other Ambulatory Visit (HOSPITAL_COMMUNITY): Payer: Self-pay | Admitting: Physician Assistant

## 2012-09-25 DIAGNOSIS — Z139 Encounter for screening, unspecified: Secondary | ICD-10-CM

## 2012-10-04 ENCOUNTER — Ambulatory Visit (HOSPITAL_COMMUNITY): Payer: Self-pay

## 2013-02-18 ENCOUNTER — Emergency Department (HOSPITAL_COMMUNITY)
Admission: EM | Admit: 2013-02-18 | Discharge: 2013-02-19 | Disposition: A | Discharge status: Home / Self Care | Payer: Self-pay | Attending: Emergency Medicine | Admitting: Emergency Medicine

## 2013-02-18 ENCOUNTER — Encounter (HOSPITAL_COMMUNITY): Payer: Self-pay | Admitting: *Deleted

## 2013-02-18 DIAGNOSIS — F17200 Nicotine dependence, unspecified, uncomplicated: Secondary | ICD-10-CM | POA: Insufficient documentation

## 2013-02-18 DIAGNOSIS — H53149 Visual discomfort, unspecified: Secondary | ICD-10-CM | POA: Insufficient documentation

## 2013-02-18 DIAGNOSIS — K089 Disorder of teeth and supporting structures, unspecified: Secondary | ICD-10-CM | POA: Insufficient documentation

## 2013-02-18 DIAGNOSIS — F411 Generalized anxiety disorder: Secondary | ICD-10-CM | POA: Insufficient documentation

## 2013-02-18 DIAGNOSIS — I10 Essential (primary) hypertension: Secondary | ICD-10-CM | POA: Insufficient documentation

## 2013-02-18 DIAGNOSIS — K0889 Other specified disorders of teeth and supporting structures: Secondary | ICD-10-CM

## 2013-02-18 DIAGNOSIS — R51 Headache: Secondary | ICD-10-CM | POA: Insufficient documentation

## 2013-02-18 MED ORDER — METOCLOPRAMIDE HCL 5 MG/ML IJ SOLN
10.0000 mg | Freq: Once | INTRAMUSCULAR | Status: AC
  Administered 2013-02-18: 10 mg via INTRAMUSCULAR
  Filled 2013-02-18: qty 2

## 2013-02-18 MED ORDER — DIPHENHYDRAMINE HCL 50 MG/ML IJ SOLN
50.0000 mg | Freq: Once | INTRAMUSCULAR | Status: AC
  Administered 2013-02-18: 50 mg via INTRAMUSCULAR
  Filled 2013-02-18: qty 1

## 2013-02-18 NOTE — ED Provider Notes (Signed)
History     CSN: [id number]  Arrival date & time 02/18/13  2148   First MD Initiated Contact with Patient 02/18/13 2300      Chief Complaint  Patient presents with  . Headache     Patient is a 52 y.o. female presenting with headaches. The history is provided by the patient.  Headache Pain location:  Generalized Quality:  Dull Radiates to:  Does not radiate Severity currently:  10/10 Onset quality:  Gradual Duration:  4 days Timing:  Constant Progression:  Worsening Chronicity:  New Context: bright light   Relieved by:  Resting in a darkened room Worsened by:  Light Associated symptoms: photophobia   Associated symptoms: no abdominal pain, no back pain, no blurred vision, no dizziness, no fever, no focal weakness, no hearing loss, no loss of balance, no near-syncope, no neck stiffness, no numbness, no syncope, no visual change, no vomiting and no weakness   pt reports she developed dental pain about 4 days ago, then she started to developed a HA that started after the dental pain.  No head injury.  No falls.  No fever.  No vomiting.  No focal weakness. HA was gradual in onset.    Past Medical History  Diagnosis Date  . Hypertension     History reviewed. No pertinent past surgical history.  History reviewed. No pertinent family history.  History  Substance Use Topics  . Smoking status: Current Some Day Smoker  . Smokeless tobacco: Not on file  . Alcohol Use: Yes    OB History   Grav Para Term Preterm Abortions TAB SAB Ect Mult Living                  Review of Systems  Constitutional: Negative for fever.  HENT: Negative for hearing loss and neck stiffness.   Eyes: Positive for photophobia. Negative for blurred vision.  Respiratory: Negative for shortness of breath.   Cardiovascular: Negative for chest pain, syncope and near-syncope.  Gastrointestinal: Negative for vomiting and abdominal pain.  Musculoskeletal: Negative for back pain.  Skin: Negative for  color change.  Neurological: Positive for headaches. Negative for dizziness, focal weakness, facial asymmetry, weakness, numbness and loss of balance.  Psychiatric/Behavioral: Negative for agitation. The patient is nervous/anxious.   All other systems reviewed and are negative.    Allergies  Review of patient's allergies indicates no known allergies.  Home Medications   Current Outpatient Rx  Name  Route  Sig  Dispense  Refill  . acetaminophen (TYLENOL) 500 MG tablet   Oral   Take 500 mg by mouth daily as needed for pain.         Marland Kitchen ibuprofen (ADVIL,MOTRIN) 200 MG tablet   Oral   Take 200 mg by mouth daily as needed for pain.           BP 142/109  Pulse 75  Temp(Src) 97.7 F (36.5 C) (Oral)  Resp 20  Ht 5\' 9"  (1.753 m)  Wt 171 lb 6.4 oz (77.747 kg)  BMI 25.3 kg/m2  SpO2 100%  Physical Exam CONSTITUTIONAL: Well developed/well nourished HEAD: Normocephalic/atraumatic EYES: EOMI/PERRL, no nystagmus ENMT: Mucous membranes moist, poor dentition, tenderness to right lower molars.  No abscess noted.  No trismus.  Left TM/right TM intact without erythema NECK: supple no meningeal signs, no bruits SPINE:entire spine nontender CV: S1/S2 noted, no murmurs/rubs/gallops noted LUNGS: Lungs are clear to auscultation bilaterally, no apparent distress ABDOMEN: soft, nontender, no rebound or guarding GU:no cva tenderness NEURO:Awake/alert,  facies symmetric, no arm or leg drift is noted Cranial nerves 3/4/5/6/05/29/09/11/12 tested and intact Gait normal without ataxia No past pointing EXTREMITIES: pulses normal, full ROM SKIN: warm, color normal PSYCH: mildly anxious   ED Course  Procedures   12:26 AM Pt walking around the ED in no distress requesting d/c home.  She reports she feels better and that her ride is leaving.  For her dental pain I will start an antibiotic.  For her HA I advised her to see her PCP this week for re-evaluation.  At this point, given that she states  the HA started after dental pain, gradual onset, I doubt SAH.  I doubt acute hypertensive emergency or acute CVA at this time.  Pt is stable for d/c home  MDM  Nursing notes including past medical history and social history reviewed and considered in documentation         Joya Gaskins, MD 02/19/13 0028

## 2013-02-18 NOTE — ED Notes (Signed)
"  Pain behind my eyes",  Nausea,  No HI,

## 2013-02-18 NOTE — ED Notes (Signed)
Patient complain of headache notified nurse

## 2013-02-19 MED ORDER — AMOXICILLIN 500 MG PO CAPS: 500.0000 mg | ORAL_CAPSULE | Freq: Three times a day (TID) | ORAL | Status: DC

## 2013-02-19 NOTE — ED Notes (Signed)
Patient came to nursing station and requesting to go home. Advised MD.

## 2013-03-21 ENCOUNTER — Encounter (HOSPITAL_COMMUNITY): Payer: Self-pay | Admitting: *Deleted

## 2013-03-21 ENCOUNTER — Emergency Department (HOSPITAL_COMMUNITY)
Admission: EM | Admit: 2013-03-21 | Discharge: 2013-03-21 | Disposition: A | Discharge status: Home / Self Care | Payer: Self-pay | Attending: Emergency Medicine | Admitting: Emergency Medicine

## 2013-03-21 DIAGNOSIS — F411 Generalized anxiety disorder: Secondary | ICD-10-CM | POA: Insufficient documentation

## 2013-03-21 DIAGNOSIS — R6883 Chills (without fever): Secondary | ICD-10-CM | POA: Insufficient documentation

## 2013-03-21 DIAGNOSIS — R51 Headache: Secondary | ICD-10-CM | POA: Insufficient documentation

## 2013-03-21 DIAGNOSIS — M79621 Pain in right upper arm: Secondary | ICD-10-CM

## 2013-03-21 DIAGNOSIS — F17200 Nicotine dependence, unspecified, uncomplicated: Secondary | ICD-10-CM | POA: Insufficient documentation

## 2013-03-21 DIAGNOSIS — M79609 Pain in unspecified limb: Secondary | ICD-10-CM | POA: Insufficient documentation

## 2013-03-21 DIAGNOSIS — M7989 Other specified soft tissue disorders: Secondary | ICD-10-CM | POA: Insufficient documentation

## 2013-03-21 DIAGNOSIS — I10 Essential (primary) hypertension: Secondary | ICD-10-CM | POA: Insufficient documentation

## 2013-03-21 MED ORDER — ONDANSETRON HCL 4 MG PO TABS
4.0000 mg | ORAL_TABLET | Freq: Once | ORAL | Status: AC
  Administered 2013-03-21: 4 mg via ORAL
  Filled 2013-03-21: qty 1

## 2013-03-21 MED ORDER — IBUPROFEN 800 MG PO TABS
800.0000 mg | ORAL_TABLET | Freq: Once | ORAL | Status: AC
  Administered 2013-03-21: 800 mg via ORAL
  Filled 2013-03-21: qty 1

## 2013-03-21 MED ORDER — IBUPROFEN 800 MG PO TABS: 800.0000 mg | ORAL_TABLET | Freq: Three times a day (TID) | ORAL | Status: DC

## 2013-03-21 MED ORDER — PENICILLIN G BENZATHINE 1200000 UNIT/2ML IM SUSP
1.2000 10*6.[IU] | Freq: Once | INTRAMUSCULAR | Status: AC
  Administered 2013-03-21: 1.2 10*6.[IU] via INTRAMUSCULAR
  Filled 2013-03-21: qty 2

## 2013-03-21 MED ORDER — HYDROCODONE-ACETAMINOPHEN 5-325 MG PO TABS
2.0000 | ORAL_TABLET | Freq: Once | ORAL | Status: AC
  Administered 2013-03-21: 2 via ORAL
  Filled 2013-03-21: qty 2

## 2013-03-21 MED ORDER — HYDROCODONE-ACETAMINOPHEN 5-325 MG PO TABS: 1.0000 | ORAL_TABLET | ORAL | Status: DC | PRN

## 2013-03-21 NOTE — ED Notes (Signed)
Pain , swelling rt axilla, and says she removed several ticks from trunk area.

## 2013-03-21 NOTE — ED Provider Notes (Signed)
History     CSN: [id number]  Arrival date & time 03/21/13  1108   None     Chief Complaint  Patient presents with  . Arm Pain    (Consider location/radiation/quality/duration/timing/severity/associated sxs/prior treatment) HPI Comments: Patient is a 52 year old [place] American female who was seen by Dr. Sudie Bailey. The patient presents to the emergency department today because of pain and swelling involving the right axilla. The patient states that several years ago she had several cyst an glans removed from the right and left axilla area. She states that from time to time she gets pain and swelling in this area. She presents at this time for assistance with her pain. She has not had fever that has been recorded, patient does state that she has had some chills. There's been no drainage noted from the axilla areas. The patient continues to shave under these areas. She has taken Tylenol and ibuprofen but states these are not helping.  The history is provided by the patient.    Past Medical History  Diagnosis Date  . Hypertension     History reviewed. No pertinent past surgical history.  History reviewed. No pertinent family history.  History  Substance Use Topics  . Smoking status: Current Some Day Smoker  . Smokeless tobacco: Not on file  . Alcohol Use: Yes    OB History   Grav Para Term Preterm Abortions TAB SAB Ect Mult Living                  Review of Systems  Constitutional: Negative for activity change.       All ROS Neg except as noted in HPI  HENT: Negative for nosebleeds and neck pain.   Eyes: Negative for photophobia and discharge.  Respiratory: Negative for cough, shortness of breath and wheezing.   Cardiovascular: Negative for chest pain and palpitations.  Gastrointestinal: Negative for abdominal pain and blood in stool.  Genitourinary: Negative for dysuria, frequency and hematuria.  Musculoskeletal: Negative for back pain and arthralgias.  Skin: Negative.    Neurological: Positive for headaches. Negative for dizziness, seizures and speech difficulty.  Psychiatric/Behavioral: Negative for hallucinations and confusion. The patient is nervous/anxious.     Allergies  Review of patient's allergies indicates no known allergies.  Home Medications   Current Outpatient Rx  Name  Route  Sig  Dispense  Refill  . acetaminophen (TYLENOL) 500 MG tablet   Oral   Take 500 mg by mouth daily as needed for pain.         Marland Kitchen ibuprofen (ADVIL,MOTRIN) 200 MG tablet   Oral   Take 200 mg by mouth daily as needed for pain.           BP 127/101  Pulse 60  Temp(Src) 98.4 F (36.9 C) (Oral)  Resp 20  Ht 5\' 9"  (1.753 m)  Wt 164 lb (74.39 kg)  BMI 24.21 kg/m2  SpO2 98%  Physical Exam  Nursing note and vitals reviewed. Constitutional: [name], place, and time. She appears well-developed and well-nourished.  Non-toxic appearance.  HENT:  Head: Normocephalic.  Right Ear: Tympanic membrane and external ear normal.  Left Ear: Tympanic membrane and external ear normal.  Eyes: EOM and lids are normal. Pupils are equal, round, and reactive to light.  Neck: Normal range of motion. Neck supple. Carotid bruit is not present.  Cardiovascular: Normal rate, regular rhythm, normal heart sounds, intact distal pulses and normal pulses.   Pulmonary/Chest: Breath sounds normal. No  respiratory distress.  Abdominal: Soft. Bowel sounds are normal. There is no tenderness. There is no guarding.  Musculoskeletal: Normal range of motion.  There are well-healed surgical scars of the right and left axilla. There is some increased puffiness noted of the right axilla. The patient would not cooperate for thoroughly evaluation including palpation and range of motion. The right upper extremity is not hot. There is no temperature change between the right and left extremity. The radial pulses are 2+ and symmetrical. Capillary refill is about bilaterally less than 3  seconds.  Lymphadenopathy:       Head (right side): No submandibular adenopathy present.       Head (left side): No submandibular adenopathy present.    She has no cervical adenopathy.  Neurological: She is alert and oriented to person, place, and time. She has normal strength. No cranial nerve deficit or sensory deficit.  Skin: Skin is warm and dry.  Psychiatric: She has a normal mood and affect. Her speech is normal.    ED Course  Procedures (including critical care time)  Labs Reviewed - No data to display No results found.  Pulse oximetry is 98% on room. Within normal limits by my interpretation. No diagnosis found.    MDM  I have reviewed nursing notes, vital signs, and all appropriate lab and imaging results for this patient. Patient presents to the emergency department with pain and swelling of the right axilla. She has had glanular surgery of the right and left axilla. Vital signs are stable with exception of the blood pressure being elevated at 127/101. The patient would not cooperate for adequate palpation and examination of the axilla because of her pain. Patient states that in the past she has gotten a" shot of penicillin" which helped a lot". The patient is given an injection of Bicillin L-A (patient request). Prescription for ibuprofen 800 mg and Norco one every 4 hours as needed. Patient is to see her primary physician for additional evaluation and management.      Kathie Dike, PA-C 03/21/13 1259

## 2013-03-21 NOTE — ED Provider Notes (Signed)
Medical screening examination/treatment/procedure(s) were performed by non-physician practitioner and as supervising physician I was immediately available for consultation/collaboration.  Juliet Rude. Rubin Payor, MD 03/21/13 [phone]

## 2013-03-24 ENCOUNTER — Emergency Department (HOSPITAL_COMMUNITY)
Admission: EM | Admit: 2013-03-24 | Discharge: 2013-03-24 | Disposition: A | Discharge status: Home / Self Care | Payer: Self-pay | Attending: Emergency Medicine | Admitting: Emergency Medicine

## 2013-03-24 ENCOUNTER — Encounter (HOSPITAL_COMMUNITY): Payer: Self-pay

## 2013-03-24 DIAGNOSIS — IMO0002 Reserved for concepts with insufficient information to code with codable children: Secondary | ICD-10-CM | POA: Insufficient documentation

## 2013-03-24 DIAGNOSIS — L0291 Cutaneous abscess, unspecified: Secondary | ICD-10-CM

## 2013-03-24 DIAGNOSIS — F17200 Nicotine dependence, unspecified, uncomplicated: Secondary | ICD-10-CM | POA: Insufficient documentation

## 2013-03-24 DIAGNOSIS — I10 Essential (primary) hypertension: Secondary | ICD-10-CM | POA: Insufficient documentation

## 2013-03-24 MED ORDER — LIDOCAINE HCL (PF) 1 % IJ SOLN
5.0000 mL | Freq: Once | INTRAMUSCULAR | Status: AC
Start: 2013-03-24 — End: 2013-03-24
  Administered 2013-03-24: 5 mL via INTRADERMAL
  Filled 2013-03-24: qty 5

## 2013-03-24 MED ORDER — OXYCODONE-ACETAMINOPHEN 5-325 MG PO TABS
2.0000 | ORAL_TABLET | Freq: Once | ORAL | Status: AC
  Administered 2013-03-24: 2 via ORAL
  Filled 2013-03-24: qty 2

## 2013-03-24 MED ORDER — OXYCODONE-ACETAMINOPHEN 5-325 MG PO TABS: 1.0000 | ORAL_TABLET | ORAL | Status: DC | PRN

## 2013-03-24 MED ORDER — SULFAMETHOXAZOLE-TMP DS 800-160 MG PO TABS
1.0000 | ORAL_TABLET | Freq: Once | ORAL | Status: AC
  Administered 2013-03-24: 1 via ORAL
  Filled 2013-03-24: qty 1

## 2013-03-24 MED ORDER — SULFAMETHOXAZOLE-TRIMETHOPRIM 800-160 MG PO TABS: 1.0000 | ORAL_TABLET | Freq: Two times a day (BID) | ORAL | Status: DC

## 2013-03-24 MED ORDER — LIDOCAINE-EPINEPHRINE-TETRACAINE (LET) SOLUTION
3.0000 mL | Freq: Once | NASAL | Status: AC
  Administered 2013-03-24: 3 mL via TOPICAL
  Filled 2013-03-24: qty 3

## 2013-03-24 NOTE — ED Provider Notes (Signed)
History     CSN: [id number]  Arrival date & time 03/24/13  1723   First MD Initiated Contact with Patient 03/24/13 1841      Chief Complaint  Patient presents with  . Wound Check    (Consider location/radiation/quality/duration/timing/severity/associated sxs/prior treatment) HPI Comments: Patient seen here several days ago for pain to the right axilla.  [name].  States that she noticed the area began draining pus just PTA.  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  She denies fever, chills, vomiting or red streaks  Patient is a 52 y.o. female presenting with abscess. The history is provided by the patient.  Abscess Location:  Shoulder/arm Shoulder/arm abscess location:  R axilla Abscess quality: draining, fluctuance, induration, painful, redness and warmth   Red streaking: no   Duration:  1 week Progression:  Worsening Pain details:    Quality:  Throbbing and sharp   Severity:  Moderate   Timing:  Constant   Progression:  Worsening Chronicity:  Recurrent Context: not diabetes and not injected drug use   Relieved by:  Nothing Worsened by:  Warm compresses Ineffective treatments:  Warm compresses Associated symptoms: no anorexia, no fatigue, no fever, no headaches, no nausea and no vomiting   Risk factors: prior abscess     Past Medical History  Diagnosis Date  . Hypertension     History reviewed. No pertinent past surgical history.  No family history on file.  History  Substance Use Topics  . Smoking status: Current Some Day Smoker  . Smokeless tobacco: Not on file  . Alcohol Use: Yes    OB History   Grav Para Term Preterm Abortions TAB SAB Ect Mult Living                  Review of Systems  Constitutional: Negative for fever, chills and fatigue.  Gastrointestinal: Negative for nausea, vomiting and anorexia.  Musculoskeletal: Negative for joint swelling and arthralgias.  Skin:  Positive for color change.       Abscess   Neurological: Negative for headaches.  Hematological: Negative for adenopathy.  All other systems reviewed and are negative.    Allergies  Review of patient's allergies indicates no known allergies.  Home Medications   Current Outpatient Rx  Name  Route  Sig  Dispense  Refill  . acetaminophen (TYLENOL) 500 MG tablet   Oral   Take 500 mg by mouth daily as needed for pain.         Marland Kitchen HYDROcodone-acetaminophen (NORCO/VICODIN) 5-325 MG per tablet   Oral   Take 1 tablet by mouth every 4 (four) hours as needed for pain.   15 tablet   0   . ibuprofen (ADVIL,MOTRIN) 200 MG tablet   Oral   Take 200 mg by mouth daily as needed for pain.         Marland Kitchen ibuprofen (ADVIL,MOTRIN) 800 MG tablet   Oral   Take 1 tablet (800 mg total) by mouth 3 (three) times daily.   21 tablet   0     BP 152/107  Pulse 80  Temp(Src) 98.2 F (36.8 C) (Oral)  Resp 20  Ht 5\' 9"  (1.753 m)  Wt 164 lb (74.39 kg)  BMI 24.21 kg/m2  SpO2 100%  Physical Exam  Nursing note and vitals reviewed. Constitutional: [name], place, and time. She appears well-developed and well-nourished. No distress.  HENT:  Head: Normocephalic and atraumatic.  Cardiovascular: Normal rate, regular rhythm, normal heart sounds and intact distal pulses.   No murmur heard. Pulmonary/Chest: Effort normal and breath sounds normal. No respiratory distress.  Musculoskeletal: Normal range of motion. She exhibits no edema.  Neurological: She is alert and oriented to person, place, and time. She exhibits normal muscle tone. Coordination normal.  Skin: Skin is warm. There is erythema.  Abscess to the  Right axilla with moderate induration and fluctuance present.  Exam is limited due to patient's level of pain and lack of cooperation.  Mild purulent drainage is present    ED Course  Procedures (including critical care time)  Labs Reviewed - No data to display No results  found.   LET applied to the right axilla     MDM   Previous ed chart reviewed.  BP slighlty elevated, so present on previous ed visit.  Dneies headache, CP, shortness of breath or sx's other than pain to the axilla.    Patient with a draining abscess to the right axilla.  Copious amts of purulent drainage after removal of the LET.  Pain improved.  Patient requested NOT to have I&D performed after presence of drainage.  I have explained to her that the infection may continue to spread despite the drainage, she verbalized understanding and states that she will return here for I&D in 1-2 days if the symptoms are not continuing to improve.    I will prescribe Bactrim and percocet, she agrees to frequent warm compresses.  The patient appears reasonably screened and/or stabilized for discharge and I doubt any other medical condition or other [facility] requiring further screening, evaluation, or treatment in the ED at this time prior to discharge.   Becky Hanahan L. Trisha Mangle, PA-C 03/27/13 1617

## 2013-03-24 NOTE — ED Notes (Signed)
Discharge instructions given and reviewed with patient.  Prescriptions given for Bactrim and Percocet; effects and use explained.  Patient verbalized understanding to complete all Bactrim and sedating effects of Percocet.  Percocet pre-pack give per order.  Patient ambulatory; discharged home in good condition.  Significant other accompanied discharge to drive home.

## 2013-03-24 NOTE — ED Notes (Signed)
Pt reports ?abcess to right axilla x 1 week, was seen in the ed and given antibiotic and pain meds, area more swollen and painful. Denies any drainage or fever.

## 2013-03-31 NOTE — ED Provider Notes (Signed)
Medical screening examination/treatment/procedure(s) were performed by non-physician practitioner and as supervising physician I was immediately available for consultation/collaboration.  Donnetta Hutching, MD 03/31/13 1550

## 2014-02-28 ENCOUNTER — Encounter (HOSPITAL_COMMUNITY): Payer: Self-pay | Admitting: Emergency Medicine

## 2014-02-28 ENCOUNTER — Emergency Department (HOSPITAL_COMMUNITY): Payer: Self-pay

## 2014-02-28 ENCOUNTER — Emergency Department (HOSPITAL_COMMUNITY)
Admission: EM | Admit: 2014-02-28 | Discharge: 2014-02-28 | Disposition: A | Discharge status: Home / Self Care | Payer: Self-pay | Attending: Emergency Medicine | Admitting: Emergency Medicine

## 2014-02-28 DIAGNOSIS — I10 Essential (primary) hypertension: Secondary | ICD-10-CM | POA: Insufficient documentation

## 2014-02-28 DIAGNOSIS — F17200 Nicotine dependence, unspecified, uncomplicated: Secondary | ICD-10-CM | POA: Insufficient documentation

## 2014-02-28 DIAGNOSIS — J40 Bronchitis, not specified as acute or chronic: Secondary | ICD-10-CM | POA: Insufficient documentation

## 2014-02-28 HISTORY — DX: Bronchitis, not specified as acute or chronic: J40

## 2014-02-28 MED ORDER — ALBUTEROL SULFATE HFA 108 (90 BASE) MCG/ACT IN AERS
2.0000 | INHALATION_SPRAY | RESPIRATORY_TRACT | Status: DC | PRN
  Administered 2014-02-28: 2 via RESPIRATORY_TRACT
  Filled 2014-02-28: qty 6.7

## 2014-02-28 MED ORDER — HYDROCOD POLST-CHLORPHEN POLST 10-8 MG/5ML PO LQCR: 5.0000 mL | Freq: Two times a day (BID) | ORAL | Status: DC | PRN

## 2014-02-28 NOTE — Care Management Note (Signed)
ED/CM noted patient did not have health insurance and/or PCP listed in the computer.  Patient was given the [facility] with information on the clinics, food pantries, and the handout for new health insurance sign-up.  Patient expressed appreciation for information received. Pt also was given a Rx discount card. She states she had no money to buy her Rx today, and would appreciate any assistance CM could give. Explained the [facility] program and that the Rx would each cost $3. She states that she can probably get that much, since it is only 2-3 Rx. Assisted with the [facility] program for her medications.Pt very appreciative.

## 2014-02-28 NOTE — ED Notes (Signed)
Pt alert & oriented x4, stable gait. Patient given discharge instructions, paperwork & prescription(s). Patient  instructed to stop at the registration desk to finish any additional paperwork. Patient verbalized understanding. Pt left department w/ no further questions. 

## 2014-02-28 NOTE — ED Notes (Signed)
Pt c/o cough x 3 days

## 2014-02-28 NOTE — ED Provider Notes (Signed)
CSN: [id number]     Arrival date & time 02/28/14  8921 History  This chart was scribed for Becky Diego, MD by Ludger Nutting, ED Scribe. This patient was seen in room APA05/APA05 and the patient's care was started 7:24 AM.    Chief Complaint  Patient presents with  . Cough      Patient is a 53 y.o. female presenting with cough. The history is provided by the patient. No language interpreter was used.  Cough Cough characteristics:  Productive Sputum characteristics:  Green Severity:  Moderate Onset quality:  Gradual Duration:  3 days Timing:  Intermittent Progression:  Worsening Smoker: no   Relieved by:  Nothing Worsened by:  Deep breathing Ineffective treatments:  None tried Associated symptoms: no chest pain, no eye discharge, no headaches and no rash     HPI Comments: Becky [name] is a 53 y.o. female who presents to the Emergency Department complaining of 3 days of gradual onset, gradually worsening, intermittent cough that is productive of green sputum. She reports having a small amount of blood streaks in the sputum as well. She states the coughing is worse with deep breathing. She denies rhinorrhea, sore throat. She is a non-smoker.   Past Medical History  Diagnosis Date  . Hypertension   . Bronchitis    History reviewed. No pertinent past surgical history. History reviewed. No pertinent family history. History  Substance Use Topics  . Smoking status: Current Some Day Smoker  . Smokeless tobacco: Not on file  . Alcohol Use: Yes   OB History   Grav Para Term Preterm Abortions TAB SAB Ect Mult Living                 Review of Systems  Constitutional: Negative for appetite change and fatigue.  HENT: Negative for congestion, ear discharge and sinus pressure.   Eyes: Negative for discharge.  Respiratory: Positive for cough.   Cardiovascular: Negative for chest pain.  Gastrointestinal: Negative for abdominal pain and diarrhea.  Genitourinary: Negative for  frequency and hematuria.  Musculoskeletal: Negative for back pain.  Skin: Negative for rash.  Neurological: Negative for seizures and headaches.  Psychiatric/Behavioral: Negative for hallucinations.      Allergies  Review of patient's allergies indicates no known allergies.  Home Medications   Current Outpatient Rx  Name  Route  Sig  Dispense  Refill  . acetaminophen (TYLENOL) 500 MG tablet   Oral   Take 500 mg by mouth daily as needed for pain.         Marland Kitchen HYDROcodone-acetaminophen (NORCO/VICODIN) 5-325 MG per tablet   Oral   Take 1 tablet by mouth every 4 (four) hours as needed for pain.   15 tablet   0   . ibuprofen (ADVIL,MOTRIN) 200 MG tablet   Oral   Take 200 mg by mouth daily as needed for pain.         Marland Kitchen ibuprofen (ADVIL,MOTRIN) 800 MG tablet   Oral   Take 1 tablet (800 mg total) by mouth 3 (three) times daily.   21 tablet   0   . oxyCODONE-acetaminophen (PERCOCET/ROXICET) 5-325 MG per tablet   Oral   Take 1 tablet by mouth every 4 (four) hours as needed for pain.   6 tablet   0   . oxyCODONE-acetaminophen (PERCOCET/ROXICET) 5-325 MG per tablet   Oral   Take 1 tablet by mouth every 4 (four) hours as needed for pain.   10 tablet   0   .  sulfamethoxazole-trimethoprim (SEPTRA DS) 800-160 MG per tablet   Oral   Take 1 tablet by mouth 2 (two) times daily. For 14 days   28 tablet   0    BP 117/68  Pulse 92  Temp(Src) 99 F (37.2 C) (Oral)  Resp 20  Ht 5\' 9"  (1.753 m)  Wt 168 lb (76.204 kg)  BMI 24.80 kg/m2  SpO2 98% Physical Exam  Nursing note and vitals reviewed. Constitutional: [name], place, and time. She appears well-developed.  HENT:  Head: Normocephalic.  Eyes: Conjunctivae and EOM are normal. No scleral icterus.  Neck: Neck supple. No thyromegaly present.  Cardiovascular: Normal rate, regular rhythm and normal heart sounds.  Exam reveals no gallop and no friction rub.   No murmur heard. Pulmonary/Chest: Effort  normal and breath sounds normal. No stridor. No respiratory distress. She has no wheezes. She has no rales. She exhibits no tenderness.  Abdominal: She exhibits no distension. There is no tenderness. There is no rebound.  Musculoskeletal: Normal range of motion. She exhibits no edema.  Lymphadenopathy:    She has no cervical adenopathy.  Neurological: [name], place, and time. She exhibits normal muscle tone. Coordination normal.  Skin: No rash noted. No erythema.  Psychiatric: She has a normal mood and affect. Her behavior is normal.    ED Course  Procedures (including critical care time) DIAGNOSTIC STUDIES: Oxygen Saturation is 98% on RA, normal by my interpretation.    COORDINATION OF CARE: 7:24 AM Discussed treatment plan with pt at bedside and pt agreed to plan.   Labs Review Labs Reviewed - No data to display Imaging Review Dg Chest 2 View  02/28/2014   CLINICAL DATA:  Cough and chest pain.  Hypertension.  EXAM: CHEST  2 VIEW  COMPARISON:  CT chest 07/18/2004  FINDINGS: The heart size is normal. Mild emphysematous changes are present. No focal airspace disease is evident. The visualized soft tissues and bony thorax are unremarkable.  IMPRESSION: 1. Mild emphysema. 2. No acute cardiopulmonary disease.   Electronically Signed   By: Lawrence Santiago M.D.   On: 02/28/2014 07:47     EKG Interpretation None      MDM   Final diagnoses:  None   The chart was scribed for me under my direct supervision.  I personally performed the history, physical, and medical decision making and all procedures in the evaluation of this patient.Becky Diego, MD 02/28/14 0900

## 2014-02-28 NOTE — Discharge Instructions (Signed)
Follow up next week if not improving. °

## 2014-06-11 ENCOUNTER — Emergency Department (HOSPITAL_COMMUNITY)
Admission: EM | Admit: 2014-06-11 | Discharge: 2014-06-11 | Disposition: A | Discharge status: Home / Self Care | Payer: Self-pay

## 2014-09-06 ENCOUNTER — Emergency Department (HOSPITAL_COMMUNITY): Payer: Self-pay

## 2014-09-06 ENCOUNTER — Emergency Department (HOSPITAL_COMMUNITY)
Admission: EM | Admit: 2014-09-06 | Discharge: 2014-09-06 | Disposition: A | Discharge status: Home / Self Care | Payer: Self-pay | Attending: Emergency Medicine | Admitting: Emergency Medicine

## 2014-09-06 ENCOUNTER — Encounter (HOSPITAL_COMMUNITY): Payer: Self-pay | Admitting: Emergency Medicine

## 2014-09-06 DIAGNOSIS — J40 Bronchitis, not specified as acute or chronic: Secondary | ICD-10-CM | POA: Insufficient documentation

## 2014-09-06 DIAGNOSIS — Z720 Tobacco use: Secondary | ICD-10-CM | POA: Insufficient documentation

## 2014-09-06 DIAGNOSIS — F419 Anxiety disorder, unspecified: Secondary | ICD-10-CM | POA: Insufficient documentation

## 2014-09-06 DIAGNOSIS — I10 Essential (primary) hypertension: Secondary | ICD-10-CM | POA: Insufficient documentation

## 2014-09-06 DIAGNOSIS — Z791 Long term (current) use of non-steroidal anti-inflammatories (NSAID): Secondary | ICD-10-CM | POA: Insufficient documentation

## 2014-09-06 MED ORDER — PREDNISONE 50 MG PO TABS
60.0000 mg | ORAL_TABLET | Freq: Once | ORAL | Status: AC
  Administered 2014-09-06: 60 mg via ORAL
  Filled 2014-09-06 (×2): qty 1

## 2014-09-06 MED ORDER — IPRATROPIUM-ALBUTEROL 0.5-2.5 (3) MG/3ML IN SOLN
3.0000 mL | Freq: Once | RESPIRATORY_TRACT | Status: AC
  Administered 2014-09-06: 3 mL via RESPIRATORY_TRACT
  Filled 2014-09-06: qty 3

## 2014-09-06 MED ORDER — IPRATROPIUM BROMIDE 0.02 % IN SOLN: 0.5000 mg | Freq: Once | RESPIRATORY_TRACT | Status: DC

## 2014-09-06 MED ORDER — ALBUTEROL SULFATE (2.5 MG/3ML) 0.083% IN NEBU: 2.5000 mg | INHALATION_SOLUTION | Freq: Once | RESPIRATORY_TRACT | Status: DC

## 2014-09-06 MED ORDER — ALBUTEROL SULFATE HFA 108 (90 BASE) MCG/ACT IN AERS
2.0000 | INHALATION_SPRAY | Freq: Once | RESPIRATORY_TRACT | Status: AC
  Administered 2014-09-06: 2 via RESPIRATORY_TRACT
  Filled 2014-09-06: qty 6.7

## 2014-09-06 MED ORDER — DEXAMETHASONE 4 MG PO TABS: ORAL_TABLET | ORAL | Status: DC

## 2014-09-06 NOTE — ED Notes (Signed)
RT paged for albuterol/spacer teaching.   Charge nurse notified that pt needs assistance with medication; case management will be paged.

## 2014-09-06 NOTE — ED Notes (Signed)
Case management at bedside.

## 2014-09-06 NOTE — Discharge Instructions (Signed)
Managing Your High Blood Pressure Blood pressure is a measurement of how forceful your blood is pressing against the walls of the arteries. Arteries are muscular tubes within the circulatory system. Blood pressure does not stay the same. Blood pressure rises when you are active, excited, or nervous; and it lowers during sleep and relaxation. If the numbers measuring your blood pressure stay above normal most of the time, you are at risk for health problems. High blood pressure (hypertension) is a long-term (chronic) condition in which blood pressure is elevated. A blood pressure reading is recorded as two numbers, such as 120 over 80 (or 120/80). The first, higher number is called the systolic pressure. It is a measure of the pressure in your arteries as the heart beats. The second, lower number is called the diastolic pressure. It is a measure of the pressure in your arteries as the heart relaxes between beats.  Keeping your blood pressure in a normal range is important to your overall health and prevention of health problems, such as heart disease and stroke. When your blood pressure is uncontrolled, your heart has to work harder than normal. High blood pressure is a very common condition in adults because blood pressure tends to rise with age. Men and women are equally likely to have hypertension but at different times in life. Before age 75, men are more likely to have hypertension. After 52 years of age, women are more likely to have it. Hypertension is especially common in African Americans. This condition often has no signs or symptoms. The cause of the condition is usually not known. Your caregiver can help you come up with a plan to keep your blood pressure in a normal, healthy range. BLOOD PRESSURE STAGES Blood pressure is classified into four stages: normal, prehypertension, stage 1, and stage 2. Your blood pressure reading will be used to determine what type of treatment, if any, is necessary.  Appropriate treatment options are tied to these four stages:  Normal  Systolic pressure (mm Hg): below 120.  Diastolic pressure (mm Hg): below 80. Prehypertension  Systolic pressure (mm Hg): 120 to 139.  Diastolic pressure (mm Hg): 80 to 89. Stage1  Systolic pressure (mm Hg): 140 to 159.  Diastolic pressure (mm Hg): 90 to 99. Stage2  Systolic pressure (mm Hg): 160 or above.  Diastolic pressure (mm Hg): 100 or above. RISKS RELATED TO HIGH BLOOD PRESSURE Managing your blood pressure is an important responsibility. Uncontrolled high blood pressure can lead to:  A heart attack.  A stroke.  A weakened blood vessel (aneurysm).  Heart failure.  Kidney damage.  Eye damage.  Metabolic syndrome.  Memory and concentration problems. HOW TO MANAGE YOUR BLOOD PRESSURE Blood pressure can be managed effectively with lifestyle changes and medicines (if needed). Your caregiver will help you come up with a plan to bring your blood pressure within a normal range. Your plan should include the following: Education  Read all information provided by your caregivers about how to control blood pressure.  Educate yourself on the latest guidelines and treatment recommendations. New research is always being done to further define the risks and treatments for high blood pressure. Lifestylechanges  Control your weight.  Avoid smoking.  Stay physically active.  Reduce the amount of salt in your diet.  Reduce stress.  Control any chronic conditions, such as high cholesterol or diabetes.  Reduce your alcohol intake. Medicines  Several medicines (antihypertensive medicines) are available, if needed, to bring blood pressure within a normal range.  Communication  Review all the medicines you take with your caregiver because there may be side effects or interactions.  Talk with your caregiver about your diet, exercise habits, and other lifestyle factors that may be contributing to  high blood pressure.  See your caregiver regularly. Your caregiver can help you create and adjust your plan for managing high blood pressure. RECOMMENDATIONS FOR TREATMENT AND FOLLOW-UP  The following recommendations are based on current guidelines for managing high blood pressure in nonpregnant adults. Use these recommendations to identify the proper follow-up period or treatment option based on your blood pressure reading. You can discuss these options with your caregiver.  Systolic pressure of 222 to 979 or diastolic pressure of 80 to 89: Follow up with your caregiver as directed.  Systolic pressure of 892 to 119 or diastolic pressure of 90 to 100: Follow up with your caregiver within 2 months.  Systolic pressure above 417 or diastolic pressure above 408: Follow up with your caregiver within 1 month.  Systolic pressure above 144 or diastolic pressure above 818: Consider antihypertensive therapy; follow up with your caregiver within 1 week.  Systolic pressure above 563 or diastolic pressure above 149: Begin antihypertensive therapy; follow up with your caregiver within 1 week. Document Released: 08/01/2012 Document Reviewed: 08/01/2012 [facility] Patient Information 2015 [place]. This information is not intended to replace advice given to you by your health care provider. Make sure you discuss any questions you have with your health care provider.  Metered Dose Inhaler with Spacer Inhaled medicines are the basis of treatment of asthma and other breathing problems. Inhaled medicine can only be effective if used properly. Good technique assures that the medicine reaches the lungs. Your health care provider has asked you to use a spacer with your inhaler to help you take the medicine more effectively. A spacer is a plastic tube with a mouthpiece on one end and an opening that connects to the inhaler on the other end. Metered dose inhalers (MDIs) are used to deliver a variety of inhaled  medicines. These include quick relief or rescue medicines (such as bronchodilators) and controller medicines (such as corticosteroids). The medicine is delivered by pushing down on a metal canister to release a set amount of spray. If you are using different kinds of inhalers, use your quick relief medicine to open the airways 10-15 minutes before using a steroid if instructed to do so by your health care provider. If you are unsure which inhalers to use and the order of using them, ask your health care provider, nurse, or respiratory therapist. HOW TO USE THE INHALER WITH A SPACER 1. Remove cap from inhaler. 2. If you are using the inhaler for the first time, you will need to prime it. Shake the inhaler for 5 seconds and release four puffs into the air, away from your face. Ask your health care provider or pharmacist if you have questions about priming your inhaler. 3. Shake inhaler for 5 seconds before each breath in (inhalation). 4. Place the open end of the spacer onto the mouthpiece of the inhaler. 5. Position the inhaler so that the top of the canister faces up and the spacer mouthpiece faces you. 6. Put your index finger on the top of the medicine canister. Your thumb supports the bottom of the inhaler and the spacer. 7. Breathe out (exhale) normally and as completely as possible. 8. Immediately after exhaling, place the spacer between your teeth and into your mouth. Close your mouth tightly around the  spacer. 9. Press the canister down with the index finger to release the medicine. 10. At the same time as the canister is pressed, inhale deeply and slowly until the lungs are completely filled. This should take 4-6 seconds. Keep your tongue down and out of the way. 11. Hold the medicine in your lungs for 5-10 seconds (10 seconds is best). This helps the medicine get into the small airways of your lungs. Exhale. 12. Repeat inhaling deeply through the spacer mouthpiece. Again hold that breath for  up to 10 seconds (10 seconds is best). Exhale slowly. If it is difficult to take this second deep breath through the spacer, breathe normally several times through the spacer. Remove the spacer from your mouth. 13. Wait at least 15-30 seconds between puffs. Continue with the above steps until you have taken the number of puffs your health care provider has ordered. Do not use the inhaler more than your health care provider directs you to. 14. Remove spacer from the inhaler and place cap on inhaler. 15. Follow the directions from your health care provider or the inhaler insert for cleaning the inhaler and spacer. If you are using a steroid inhaler, rinse your mouth with water after your last puff, gargle, and spit out the water. Do not swallow the water. AVOID:  Inhaling before or after starting the spray of medicine. It takes practice to coordinate your breathing with triggering the spray.  Inhaling through the nose (rather than the mouth) when triggering the spray. HOW TO DETERMINE IF YOUR INHALER IS FULL OR NEARLY EMPTY You cannot know when an inhaler is empty by shaking it. A few inhalers are now being made with dose counters. Ask your health care provider for a prescription that has a dose counter if you feel you need that extra help. If your inhaler does not have a counter, ask your health care provider to help you determine the date you need to refill your inhaler. Write the refill date on a calendar or your inhaler canister. Refill your inhaler 7-10 days before it runs out. Be sure to keep an adequate supply of medicine. This includes making sure it is not expired, and you have a spare inhaler.  SEEK MEDICAL CARE IF:   Symptoms are only partially relieved with your inhaler.  You are having trouble using your inhaler.  You experience some increase in phlegm. SEEK IMMEDIATE MEDICAL CARE IF:   You feel little or no relief with your inhalers. You are still wheezing and are feeling shortness  of breath or tightness in your chest or both.  You have dizziness, headaches, or fast heart rate.  You have chills, fever, or night sweats.  There is a noticeable increase in phlegm production, or there is blood in the phlegm. Document Released: 11/07/2005 Document Revised: 03/24/2014 Document Reviewed: 04/25/2013 [facility] Patient Information 2015 [place], [place]. This information is not intended to replace advice given to you by your health care provider. Make sure you discuss any questions you have with your health care provider.

## 2014-09-06 NOTE — ED Provider Notes (Signed)
  Medical screening examination/treatment/procedure(s) were performed by non-physician practitioner and as supervising physician I was immediately available for consultation/collaboration.   EKG Interpretation None         Carmin Muskrat, MD 09/06/14 1424

## 2014-09-06 NOTE — Progress Notes (Addendum)
ED/CM noted patient did not have health insurance and/or PCP listed in the computer.  Patient was given the [facility] resources handout with information on the clinics, food pantries, and the handout for new health insurance sign-up. Also provided drug discount card.  Patient needs assistant with Decadron medication. Patient received assistance with the [facility] program April 2015. Patient stated she did not get the prescription filled once she received the form for assistance.  Explained once a year assistance for medication with program. Patient was able to understand.  Informed to follow up with the [facility] department as well as the [facility].  Patient expressed appreciation for information received also stated may not get the prescription filled.

## 2014-09-06 NOTE — ED Notes (Signed)
Pt states she has had a cough with congestion x 1 week, states she gets bronchitis this time every year.

## 2014-09-06 NOTE — ED Provider Notes (Signed)
CSN: [id number]     Arrival date & time 09/06/14  9150 History   First MD Initiated Contact with Patient 09/06/14 [phone]     Chief Complaint  Patient presents with  . Cough     (Consider location/radiation/quality/duration/timing/severity/associated sxs/prior Treatment) Patient is a 53 y.o. female presenting with cough. The history is provided by the patient.  Cough Cough characteristics:  Non-productive and hacking Severity:  Moderate Onset quality:  Gradual Duration:  1 week Timing:  Intermittent Progression:  Worsening Chronicity:  Recurrent Smoker: yes   Context: sick contacts and weather changes   Relieved by:  Nothing Ineffective treatments:  None tried Associated symptoms: shortness of breath and wheezing   Associated symptoms: no chest pain, no chills, no eye discharge and no fever   Risk factors: no chemical exposure, no recent infection and no recent travel     Past Medical History  Diagnosis Date  . Hypertension   . Bronchitis    History reviewed. No pertinent past surgical history. History reviewed. No pertinent family history. History  Substance Use Topics  . Smoking status: Current Some Day Smoker  . Smokeless tobacco: Not on file  . Alcohol Use: Yes   OB History   Grav Para Term Preterm Abortions TAB SAB Ect Mult Living                 Review of Systems  Constitutional: Negative for fever, chills, activity change and fatigue.       All ROS Neg except as noted in HPI  Eyes: Negative for photophobia and discharge.  Respiratory: Positive for cough, shortness of breath and wheezing.   Cardiovascular: Negative for chest pain and palpitations.  Gastrointestinal: Negative for abdominal pain and blood in stool.  Genitourinary: Negative for dysuria, frequency and hematuria.  Musculoskeletal: Negative for arthralgias, back pain and neck pain.  Skin: Negative.   Neurological: Negative for dizziness, seizures and speech difficulty.  Psychiatric/Behavioral:  Negative for hallucinations and confusion.      Allergies  Review of patient's allergies indicates no known allergies.  Home Medications   Prior to Admission medications   Medication Sig Start Date End Date Taking? Authorizing Provider  chlorpheniramine-HYDROcodone (TUSSIONEX PENNKINETIC ER) 10-8 MG/5ML LQCR Take 5 mLs by mouth every 12 (twelve) hours as needed for cough. 02/28/14   Maudry Diego, MD  naproxen sodium (ANAPROX) 220 MG tablet Take 220 mg by mouth 2 (two) times daily as needed (headache).    Historical Provider, MD   BP 165/116  Pulse 54  Temp(Src) 97.8 F (36.6 C) (Oral)  Resp 17  Ht 5\' 9"  (1.753 m)  Wt 165 lb (74.844 kg)  BMI 24.36 kg/m2  SpO2 99% Physical Exam  Nursing note and vitals reviewed. Constitutional: [name], place, and time. She appears well-developed and well-nourished.  Non-toxic appearance.  HENT:  Head: Normocephalic.  Right Ear: Tympanic membrane and external ear normal.  Left Ear: Tympanic membrane and external ear normal.  Eyes: EOM and lids are normal. Pupils are equal, round, and reactive to light.  Neck: Normal range of motion. Neck supple. Carotid bruit is not present.  Cardiovascular: Normal rate, regular rhythm, normal heart sounds, intact distal pulses and normal pulses.   Pulmonary/Chest: Effort normal. No respiratory distress. She has wheezes.  Abdominal: Soft. Bowel sounds are normal. There is no tenderness. There is no guarding.  Musculoskeletal: Normal range of motion.  Lymphadenopathy:       Head (right side): No submandibular adenopathy present.  Head (left side): No submandibular adenopathy present.    She has no cervical adenopathy.  Neurological: She is alert and oriented to person, place, and time. She has normal strength. No cranial nerve deficit or sensory deficit.  Skin: Skin is warm and dry.  Psychiatric: Her speech is normal. Her mood appears anxious.    ED Course Pt ask to see a Research officer, political party for assistance with her decadron Rx.  Social worker in the room with patient.  Procedures (including critical care time) Labs Review Labs Reviewed - No data to display  Imaging Review No results found.   EKG Interpretation None      MDM Pt breathing easier after neb tx . Pt speaking in complete sentences.  Xray negative for pneumonia or acute findings. Plan -  Albuterol inhaler given. Rx for decadron given to the patient. [facility]   Final diagnoses:  None    *I have reviewed nursing notes, vital signs, and all appropriate lab and imaging results for this patient.Lenox Ahr, PA-C 09/06/14 1059

## 2014-09-22 ENCOUNTER — Encounter (HOSPITAL_COMMUNITY): Payer: Self-pay | Admitting: Emergency Medicine

## 2014-09-22 ENCOUNTER — Emergency Department (HOSPITAL_COMMUNITY)
Admission: EM | Admit: 2014-09-22 | Discharge: 2014-09-22 | Disposition: A | Discharge status: Home / Self Care | Payer: Medicaid Other | Attending: Emergency Medicine | Admitting: Emergency Medicine

## 2014-09-22 DIAGNOSIS — J029 Acute pharyngitis, unspecified: Secondary | ICD-10-CM | POA: Insufficient documentation

## 2014-09-22 DIAGNOSIS — Z720 Tobacco use: Secondary | ICD-10-CM | POA: Insufficient documentation

## 2014-09-22 DIAGNOSIS — Z8709 Personal history of other diseases of the respiratory system: Secondary | ICD-10-CM | POA: Insufficient documentation

## 2014-09-22 DIAGNOSIS — I10 Essential (primary) hypertension: Secondary | ICD-10-CM | POA: Insufficient documentation

## 2014-09-22 DIAGNOSIS — Z7952 Long term (current) use of systemic steroids: Secondary | ICD-10-CM | POA: Insufficient documentation

## 2014-09-22 MED ORDER — ONDANSETRON HCL 4 MG/2ML IJ SOLN
4.0000 mg | Freq: Once | INTRAMUSCULAR | Status: AC
  Administered 2014-09-22: 4 mg via INTRAMUSCULAR
  Filled 2014-09-22: qty 2

## 2014-09-22 MED ORDER — MORPHINE SULFATE 4 MG/ML IJ SOLN
4.0000 mg | Freq: Once | INTRAMUSCULAR | Status: AC
Start: 2014-09-22 — End: 2014-09-22
  Administered 2014-09-22: 4 mg via INTRAVENOUS
  Filled 2014-09-22: qty 1

## 2014-09-22 MED ORDER — SODIUM CHLORIDE 0.9 % IV BOLUS (SEPSIS)
1000.0000 mL | Freq: Once | INTRAVENOUS | Status: AC
  Administered 2014-09-22: 1000 mL via INTRAVENOUS

## 2014-09-22 MED ORDER — PREDNISONE 50 MG PO TABS: ORAL_TABLET | ORAL | Status: DC

## 2014-09-22 MED ORDER — METHYLPREDNISOLONE SODIUM SUCC 125 MG IJ SOLR
125.0000 mg | Freq: Once | INTRAMUSCULAR | Status: AC
  Administered 2014-09-22: 125 mg via INTRAVENOUS
  Filled 2014-09-22: qty 2

## 2014-09-22 MED ORDER — KETOROLAC TROMETHAMINE 30 MG/ML IJ SOLN
30.0000 mg | Freq: Once | INTRAMUSCULAR | Status: AC
  Administered 2014-09-22: 30 mg via INTRAVENOUS
  Filled 2014-09-22: qty 1

## 2014-09-22 MED ORDER — OXYCODONE-ACETAMINOPHEN 5-325 MG PO TABS: 2.0000 | ORAL_TABLET | ORAL | Status: DC | PRN

## 2014-09-22 NOTE — ED Notes (Signed)
Pt c/o sore throat x 1 week

## 2014-09-22 NOTE — ED Provider Notes (Signed)
CSN: [id number]     Arrival date & time 09/22/14  1028 History  This chart was scribed for Becky Christen, MD by Becky [name], ED Scribe. This patient was seen in room APA09/APA09 and the patient's care was started at 10:51 AM.    Chief Complaint  Patient presents with  . Sore Throat   The history is provided by the patient. No language interpreter was used.   HPI Comments: Becky [name] is a 53 y.o. female who presents to the Emergency Department complaining of sore throat with onset 1 week ago. She states she has not been able to eat in 3 days and reports difficulty drinking due to throat pain. She reports history of bronchitis but denies other significant chronic health problems. She notes drinking alcohol 2 nights ago with subsequent vomiting and reports pain from her emesis.no stiff neck.  Past Medical History  Diagnosis Date  . Hypertension   . Bronchitis    History reviewed. No pertinent past surgical history. No family history on file. History  Substance Use Topics  . Smoking status: Current Some Day Smoker  . Smokeless tobacco: Not on file  . Alcohol Use: Yes   OB History    No data available     Review of Systems A complete 10 system review of systems was obtained and all systems are negative except as noted in the HPI and PMH.   Allergies  Review of patient's allergies indicates no known allergies.  Home Medications   Prior to Admission medications   Medication Sig Start Date End Date Taking? Authorizing Provider  dexamethasone (DECADRON) 4 MG tablet 1 po bid with food 09/06/14  Yes Lenox Ahr, PA-C  oxyCODONE-acetaminophen (PERCOCET) 5-325 MG per tablet Take 2 tablets by mouth every 4 (four) hours as needed. 09/22/14   Becky Christen, MD  oxyCODONE-acetaminophen (PERCOCET) 5-325 MG per tablet Take 2 tablets by mouth every 4 (four) hours as needed. 09/22/14   Becky Christen, MD  predniSONE (DELTASONE) 50 MG tablet One half tablets for 3 days, one tablet for 3 days, half a  tablet for 3 days. 09/22/14   Becky Christen, MD   BP 154/114 mmHg  Pulse 65  Temp(Src) 98.7 F (37.1 C)  Resp 18  Ht 5\' 9"  (1.753 m)  Wt 165 lb (74.844 kg)  BMI 24.36 kg/m2  SpO2 94% Physical Exam  Constitutional: [name], place, and time. She appears well-developed and well-nourished.  HENT:  Head: Normocephalic and atraumatic.  Upper palate bilateral ulcerations. Throat appears erythematous but no swelling, blockage, or edema.  Eyes: Conjunctivae and EOM are normal. Pupils are equal, round, and reactive to light.  Neck: Normal range of motion. Neck supple.  Cardiovascular: Normal rate, regular rhythm and normal heart sounds.   Pulmonary/Chest: Effort normal and breath sounds normal.  Abdominal: Soft. Bowel sounds are normal.  Musculoskeletal: Normal range of motion.  Neurological: She is alert and oriented to person, place, and time.  Skin: Skin is warm and dry.  Psychiatric: She has a normal mood and affect. Her behavior is normal.  Nursing note and vitals reviewed.   ED Course  Procedures (including critical care time)  DIAGNOSTIC STUDIES: Oxygen Saturation is 97% on room air, normal by my interpretation.    COORDINATION OF CARE: 10:56 AM Discussed treatment plan with patient at beside, including IV fluids. The patient agrees with the plan and has no further questions at this time.   Labs Review Labs Reviewed - No  data to display  Imaging Review No results found.   EKG Interpretation None      MDM   Final diagnoses:  Pharyngitis    Physical exam shows ulcerations on her hard palate most consistent with a viral infection. She feels better after IV fluids and pain management. Vital signs are stable.Discharge medications prednisone and Percocet.Encouraged to hydrate.   Becky Christen, MD 09/22/14 [phone]

## 2014-09-22 NOTE — ED Notes (Signed)
Percocet pre-pack not given during normal pharmacy operating hours. Prescription for 6 percocet 5/325mg  po placed in shred box. Pt informed. Prednisone and percocet 5/325mg  15 count prescription given.

## 2014-09-22 NOTE — Discharge Instructions (Signed)
Gargle with salt water, increase fluids. Medication for pain and prednisone.

## 2014-11-07 ENCOUNTER — Emergency Department (HOSPITAL_COMMUNITY): Payer: Medicaid Other

## 2014-11-07 ENCOUNTER — Emergency Department (HOSPITAL_COMMUNITY)
Admission: EM | Admit: 2014-11-07 | Discharge: 2014-11-07 | Disposition: A | Discharge status: Home / Self Care | Payer: Medicaid Other | Attending: Emergency Medicine | Admitting: Emergency Medicine

## 2014-11-07 ENCOUNTER — Encounter (HOSPITAL_COMMUNITY): Payer: Self-pay | Admitting: Cardiology

## 2014-11-07 DIAGNOSIS — J40 Bronchitis, not specified as acute or chronic: Secondary | ICD-10-CM | POA: Insufficient documentation

## 2014-11-07 DIAGNOSIS — M549 Dorsalgia, unspecified: Secondary | ICD-10-CM

## 2014-11-07 DIAGNOSIS — N390 Urinary tract infection, site not specified: Secondary | ICD-10-CM | POA: Insufficient documentation

## 2014-11-07 DIAGNOSIS — R079 Chest pain, unspecified: Secondary | ICD-10-CM | POA: Insufficient documentation

## 2014-11-07 DIAGNOSIS — I10 Essential (primary) hypertension: Secondary | ICD-10-CM | POA: Diagnosis not present

## 2014-11-07 DIAGNOSIS — M545 Low back pain: Secondary | ICD-10-CM | POA: Insufficient documentation

## 2014-11-07 DIAGNOSIS — Z792 Long term (current) use of antibiotics: Secondary | ICD-10-CM | POA: Diagnosis not present

## 2014-11-07 DIAGNOSIS — Z7952 Long term (current) use of systemic steroids: Secondary | ICD-10-CM | POA: Diagnosis not present

## 2014-11-07 DIAGNOSIS — R319 Hematuria, unspecified: Secondary | ICD-10-CM

## 2014-11-07 DIAGNOSIS — M459 Ankylosing spondylitis of unspecified sites in spine: Secondary | ICD-10-CM | POA: Diagnosis present

## 2014-11-07 DIAGNOSIS — R0789 Other chest pain: Secondary | ICD-10-CM

## 2014-11-07 DIAGNOSIS — R05 Cough: Secondary | ICD-10-CM

## 2014-11-07 DIAGNOSIS — R059 Cough, unspecified: Secondary | ICD-10-CM

## 2014-11-07 LAB — CBC WITH DIFFERENTIAL/PLATELET
BASOS ABS: 0 10*3/uL (ref 0.0–0.1)
Basophils Relative: 1 % (ref 0–1)
EOS PCT: 1 % (ref 0–5)
Eosinophils Absolute: 0.1 10*3/uL (ref 0.0–0.7)
HEMATOCRIT: 45.8 % (ref 36.0–46.0)
Hemoglobin: 15 g/dL (ref 12.0–15.0)
Lymphocytes Relative: 48 % — ABNORMAL HIGH (ref 12–46)
Lymphs Abs: 2.4 10*3/uL (ref 0.7–4.0)
MCH: 28.7 pg (ref 26.0–34.0)
MCHC: 32.8 g/dL (ref 30.0–36.0)
MCV: 87.6 fL (ref 78.0–100.0)
Monocytes Absolute: 0.5 10*3/uL (ref 0.1–1.0)
Monocytes Relative: 10 % (ref 3–12)
Neutro Abs: 2 10*3/uL (ref 1.7–7.7)
Neutrophils Relative %: 40 % — ABNORMAL LOW (ref 43–77)
PLATELETS: 164 10*3/uL (ref 150–400)
RBC: 5.23 MIL/uL — ABNORMAL HIGH (ref 3.87–5.11)
RDW: 13.7 % (ref 11.5–15.5)
WBC: 5 10*3/uL (ref 4.0–10.5)

## 2014-11-07 LAB — URINALYSIS, ROUTINE W REFLEX MICROSCOPIC
Bilirubin Urine: NEGATIVE
GLUCOSE, UA: NEGATIVE mg/dL
KETONES UR: NEGATIVE mg/dL
Leukocytes, UA: NEGATIVE
Nitrite: POSITIVE — AB
PROTEIN: NEGATIVE mg/dL
Specific Gravity, Urine: >1.03 — ABNORMAL HIGH (ref 1.005–1.030)
UROBILINOGEN UA: 0.2 mg/dL (ref 0.0–1.0)
pH: 5.5 (ref 5.0–8.0)

## 2014-11-07 LAB — TROPONIN I
Troponin I: <0.3 ng/mL (ref <0.30)
Troponin I: <0.3 ng/mL (ref <0.30)

## 2014-11-07 LAB — D-DIMER, QUANTITATIVE: D-Dimer, Quant: 0.72 ug/mL-FEU — ABNORMAL HIGH (ref 0.00–0.48)

## 2014-11-07 LAB — BASIC METABOLIC PANEL
ANION GAP: 14 (ref 5–15)
BUN: 10 mg/dL (ref 6–23)
CO2: 20 meq/L (ref 19–32)
CREATININE: 0.96 mg/dL (ref 0.50–1.10)
Calcium: 9.4 mg/dL (ref 8.4–10.5)
Chloride: 105 mEq/L (ref 96–112)
GFR calc non Af Amer: 67 mL/min — ABNORMAL LOW (ref >90)
GFR, EST AFRICAN AMERICAN: 77 mL/min — AB (ref >90)
Glucose, Bld: 114 mg/dL — ABNORMAL HIGH (ref 70–99)
Potassium: 4.1 mEq/L (ref 3.7–5.3)
Sodium: 139 mEq/L (ref 137–147)

## 2014-11-07 LAB — URINE MICROSCOPIC-ADD ON

## 2014-11-07 LAB — PRO B NATRIURETIC PEPTIDE: Pro B Natriuretic peptide (BNP): 26.6 pg/mL (ref 0–125)

## 2014-11-07 MED ORDER — IPRATROPIUM-ALBUTEROL 0.5-2.5 (3) MG/3ML IN SOLN
3.0000 mL | Freq: Once | RESPIRATORY_TRACT | Status: AC
  Administered 2014-11-07: 3 mL via RESPIRATORY_TRACT

## 2014-11-07 MED ORDER — DEXTROSE 5 % IV SOLN
1.0000 g | Freq: Once | INTRAVENOUS | Status: AC
  Administered 2014-11-07: 1 g via INTRAVENOUS
  Filled 2014-11-07: qty 10

## 2014-11-07 MED ORDER — ALBUTEROL SULFATE (2.5 MG/3ML) 0.083% IN NEBU
2.5000 mg | INHALATION_SOLUTION | Freq: Once | RESPIRATORY_TRACT | Status: AC
  Administered 2014-11-07: 2.5 mg via RESPIRATORY_TRACT
  Filled 2014-11-07: qty 3

## 2014-11-07 MED ORDER — LEVOFLOXACIN 500 MG PO TABS: 500.0000 mg | ORAL_TABLET | Freq: Every day | ORAL | Status: DC

## 2014-11-07 MED ORDER — IPRATROPIUM-ALBUTEROL 0.5-2.5 (3) MG/3ML IN SOLN
3.0000 mL | Freq: Once | RESPIRATORY_TRACT | Status: AC
  Filled 2014-11-07: qty 3

## 2014-11-07 MED ORDER — IBUPROFEN 800 MG PO TABS
800.0000 mg | ORAL_TABLET | Freq: Once | ORAL | Status: AC
  Administered 2014-11-07: 800 mg via ORAL
  Filled 2014-11-07: qty 1

## 2014-11-07 MED ORDER — IOHEXOL 350 MG/ML SOLN
100.0000 mL | Freq: Once | INTRAVENOUS | Status: AC | PRN
  Administered 2014-11-07: 100 mL via INTRAVENOUS

## 2014-11-07 MED ORDER — HYDROCODONE-ACETAMINOPHEN 5-325 MG PO TABS
1.0000 | ORAL_TABLET | Freq: Once | ORAL | Status: AC
  Administered 2014-11-07: 1 via ORAL
  Filled 2014-11-07: qty 1

## 2014-11-07 MED ORDER — HYDROCODONE-ACETAMINOPHEN 5-325 MG PO TABS: 2.0000 | ORAL_TABLET | ORAL | Status: DC | PRN

## 2014-11-07 NOTE — ED Notes (Signed)
C/o sob since last night.  Lower back pain times 2 days.  Denies any injury.

## 2014-11-07 NOTE — ED Notes (Signed)
Patient with no complaints at this time. Respirations even and unlabored. Skin warm/dry. Discharge instructions reviewed with patient at this time. Patient given opportunity to voice concerns/ask questions. Patient discharged at this time and left Emergency Department with steady gait.   

## 2014-11-07 NOTE — Discharge Instructions (Signed)
Acute Bronchitis Take the antibiotics as prescribed.  You need a repeat CT of your lungs in 3 months. Establish care with a primary doctor. Return to the ED if you develop new or worsening symptoms. Bronchitis is inflammation of the airways that extend from the windpipe into the lungs (bronchi). The inflammation often causes mucus to develop. This leads to a cough, which is the most common symptom of bronchitis.  In acute bronchitis, the condition usually develops suddenly and goes away over time, usually in a couple weeks. Smoking, allergies, and asthma can make bronchitis worse. Repeated episodes of bronchitis may cause further lung problems.  CAUSES Acute bronchitis is most often caused by the same virus that causes a cold. The virus can spread from person to person (contagious) through coughing, sneezing, and touching contaminated objects. SIGNS AND SYMPTOMS   Cough.   Fever.   Coughing up mucus.   Body aches.   Chest congestion.   Chills.   Shortness of breath.   Sore throat.  DIAGNOSIS  Acute bronchitis is usually diagnosed through a physical exam. Your health care provider will also ask you questions about your medical history. Tests, such as chest X-rays, are sometimes done to rule out other conditions.  TREATMENT  Acute bronchitis usually goes away in a couple weeks. Oftentimes, no medical treatment is necessary. Medicines are sometimes given for relief of fever or cough. Antibiotic medicines are usually not needed but may be prescribed in certain situations. In some cases, an inhaler may be recommended to help reduce shortness of breath and control the cough. A cool mist vaporizer may also be used to help thin bronchial secretions and make it easier to clear the chest.  HOME CARE INSTRUCTIONS  Get plenty of rest.   Drink enough fluids to keep your urine clear or pale yellow (unless you have a medical condition that requires fluid restriction). Increasing fluids may  help thin your respiratory secretions (sputum) and reduce chest congestion, and it will prevent dehydration.   Take medicines only as directed by your health care provider.  If you were prescribed an antibiotic medicine, finish it all even if you start to feel better.  Avoid smoking and secondhand smoke. Exposure to cigarette smoke or irritating chemicals will make bronchitis worse. If you are a smoker, consider using nicotine gum or skin patches to help control withdrawal symptoms. Quitting smoking will help your lungs heal faster.   Reduce the chances of another bout of acute bronchitis by washing your hands frequently, avoiding people with cold symptoms, and trying not to touch your hands to your mouth, nose, or eyes.   Keep all follow-up visits as directed by your health care provider.  SEEK MEDICAL CARE IF: Your symptoms do not improve after 1 week of treatment.  SEEK IMMEDIATE MEDICAL CARE IF:  You develop an increased fever or chills.   You have chest pain.   You have severe shortness of breath.  You have bloody sputum.   You develop dehydration.  You faint or repeatedly feel like you are going to pass out.  You develop repeated vomiting.  You develop a severe headache. MAKE SURE YOU:   Understand these instructions.  Will watch your condition.  Will get help right away if you are not doing well or get worse. Document Released: 12/15/2004 Document Revised: 03/24/2014 Document Reviewed: 04/30/2013 [facility] Patient Information 2015 [place], [place]. This information is not intended to replace advice given to you by your health care provider. Make sure you  discuss any questions you have with your health care provider.   Emergency Department Resource Guide 1) Find a Doctor and Pay Out of Pocket Although you won't have to find out who is covered by your insurance plan, it is a good idea to ask around and get recommendations. You will then need to call the office and  see if the doctor you have chosen will accept you as a new patient and what types of options they offer for patients who are self-pay. Some doctors offer discounts or will set up payment plans for their patients who do not have insurance, but you will need to ask so you aren't surprised when you get to your appointment.  2) Contact Your Local Health Department Not all health departments have doctors that can see patients for sick visits, but many do, so it is worth a call to see if yours does. If you don't know where your local health department is, you can check in your phone book. The CDC also has a tool to help you locate your state's health department, and many state websites also have listings of all of their local health departments.  3) Find a [facility] If your illness is not likely to be very severe or complicated, you may want to try a walk in clinic. These are popping up all over the country in pharmacies, drugstores, and shopping centers. They're usually staffed by nurse practitioners or physician assistants that have been trained to treat common illnesses and complaints. They're usually fairly quick and inexpensive. However, if you have serious medical issues or chronic medical problems, these are probably not your best option.  No Primary Care Doctor: - Call Health Connect at  [phone] - they can help you locate a primary care doctor that  accepts your insurance, provides certain services, etc. - Physician Referral Service- [phone]  Chronic Pain Problems: Organization         Address  Phone   Notes  [facility]  [phone] Patients need to be referred by their primary care doctor.   Medication Assistance: Organization         Address  Phone   Notes  [facility] Medication [facility] [address] [place], [place] [address] [phone] --Must be a resident of [facility] -- Must have NO insurance coverage whatsoever  (no Medicaid/ Medicare, etc.) -- The pt. MUST have a primary care doctor that directs their care regularly and follows them in the community   MedAssist  [phone]   [employer]  [phone]    Agencies that provide inexpensive medical care: Organization         Address  Phone   Notes  [place]  [phone]   [facility]    [phone]   [facility] [place], [place] [address] [phone]   [place] [address] [place] [phone]   Planned Parenthood    [phone]   [facility]    [phone]   [place] and [place] [address], [place] Phone:  [phone], Fax:  [phone] Hours of Operation:  9 am - 6 pm, M-F.  Also accepts Medicaid/Medicare and self-pay.  [facility] for [place], [address], [place] Phone: [phone], Fax: [phone]. Hours of Operation:  8:30 am - 5:30  pm, M-F.  Also accepts Medicaid and self-pay.  [facility] [place] [address] [place] Phone: [phone]   [place], [place], [place] [phone], Ext. [phone] Mondays & Thursdays: 7-9 AM.  First 15 patients are seen on a first come, first serve basis.    [place] Providers:  Organization         Address  Phone   Notes  [facility] [address], [place] [phone] Also accepts self-pay patients.  [facility] [address], [place]  [phone]   [place], [address], [place] [phone]   [facility] [address], [place] [phone]   Lucianne Lei [address], [place]   [phone] Only accepts [place] Access [place] patients after they have their name applied to their  card.   Self-Pay (no insurance) in [facility]:  Organization         Address  Phone   Notes  Sickle Cell Patients, [facility] [place] [phone]   [facility] [place] [phone]   [facility] [place]  [place], [place], [place] [phone]   [facility]/Dr. Osei-Bonsu  [address], [place] or [address], [place] [phone] Phone number for both [place] and [place] locations is the same.  [facility] [address] [place] [phone]   [facility] [address] [place] or [address] [phone]   [facility] [address] [place] [phone], phone; [phone], fax Sees patients 1st and 3rd Saturday of every month.  Must not qualify for public or private insurance (i.e. Medicaid, Medicare, [place] Health Choice, Veterans' Benefits)  Household income should be no more than 200% of the poverty level The clinic cannot treat you if you are pregnant or think you are pregnant  Sexually transmitted diseases are not treated at the clinic.    Dental Care: Organization         Address  Phone  Notes  [facility] Department of [facility] [place] [phone] Accepts children up to age 46 who are enrolled in [place] or [place]; pregnant women with a Medicaid card; and children who have applied for Medicaid or [place] Health Choice, but were declined, whose parents can pay a reduced fee at time of service.  [facility] Department of [facility]  [address], [place] [phone] Accepts children up to age 74 who are enrolled in [place] or [place]; pregnant women with a Medicaid card; and children who have applied for Medicaid or [place] Health  Choice, but were declined, whose parents can pay a reduced fee at time of service.  [facility] Adult Dental Access PROGRAM  [place] [phone] Patients are seen by appointment only. Walk-ins are not accepted. [place] will see patients 79 years of age and older. Monday - Tuesday (8am-5pm) Most Wednesdays (8:30-5pm) $30 per visit, cash only  [facility] Adult Dental Access PROGRAM  [address], [facility] [phone] Patients are seen by appointment only. Walk-ins are not  accepted. [place] will see patients 38 years of age and older. One Wednesday Evening (Monthly: Volunteer Based).  $30 per visit, cash only  [place]  [phone] for adults; Children under age 39, call Graduate Pediatric Dentistry at [phone]. Children aged 23-14, please call [phone] to request a pediatric application.  Dental services are provided in all areas of dental care including fillings, crowns and bridges, complete and partial dentures, implants, gum treatment, root canals, and extractions. Preventive care is also provided. Treatment is provided to both adults and children. Patients are selected via a lottery and there is often a waiting list.   [facility] [place] [address] [place]  [phone] [email or web address]   [facility] [address] [place], [place] [phone], Ext. [phone] Second and Fourth Thursday of each month, opens at 6:30 AM; Clinic ends at 9 AM.  Patients are seen on a first-come first-served basis, and a limited number are seen during each clinic.   [facility]  [address] Hillard Danker [place], [place] [phone]   Eligibility Requirements You must have lived in [place], [place], or [place] counties for at least the last three months.   You cannot be eligible for state or federal sponsored [employer], including [employer], [place], or [employer].   You  generally cannot be eligible for healthcare insurance through your employer.    How to apply: Eligibility screenings are held every Tuesday and Wednesday afternoon from 1:00 pm until 4:00 pm. You do not need an appointment for the interview!  [facility] [address] [place], [place]   [place]  [place] Department  [place]  [phone]    Behavioral Health Resources in the Community: Intensive Outpatient Programs Organization         Address  Phone  Notes  [place]. [address] [place], [place] [phone]   [facility] Outpatient [address] [place], [place]   ADS: Alcohol & Drug Svcs [address], [place], [place]   [place] [address]  [place], [place] or [phone]   Substance Abuse Resources Organization         Address  Phone  Notes  Alcohol and Drug Services  [phone]   [place]  [phone]   [facility] [place]   Chinita Pester  [phone]   Residential & Outpatient Substance Abuse Program  [phone]   Psychological Services Organization         Address  Phone  Notes  [facility] [place]  [place]  [phone]   [place] [address] [place] or [phone]    Mobile Crisis Teams Organization         Address  Phone  Notes  [facility], Mobile Crisis Care Unit  [phone]   [facility] Psychotherapeutic [facility]  [address] [place], [place]   [place] [address] [place] [phone]    Self-Help/Support Groups Organization         Address  Phone             Notes  [place] of [place] - variety of support groups  [place] Call for  more information  Narcotics Anonymous (NA), Caring  Services [address] [employer] [place]  2 meetings at this location   Residential Facilities manager         Address  Phone  Notes  [facility] [place],    [place]  [phone]   [facility]  [address] [place] [phone], [place], [place]   [place] [address], [place] [phone] Admissions: 8am-3pm M-F  Incentives Substance [place] [address],    [place], [place] [phone]   [facility] [address] [place], [place], [place]   [facility] [address],  [place], [place]   Insight Programs - Intensive Outpatient [address] Kristeen [address], [place], [place]   [facility] ([place] [address]  [place], [place] [phone] or [phone]   [facility]idential Treatment Services (RTS) [address], [place], [place] Accepts Medicaid  [facility] [address],  [place] [phone] Substance Abuse/Addiction Treatment   [facility] Organization         Address  Phone  Notes  [facility]  [phone]   Domenic Schwab, PhD [address] [place], [place]   [phone] or [phone]   [place], [place] [phone]   [facility] [address] [place], [place] [phone] Insurance/Medicaid/sponsorship through [facility] and Families [address], [address] [place]                                    [place], [place] [phone] [place] [address], [place] [phone]    Dr. Adele Schilder  [phone]   [facility] Dept. 1) [address], [place] 2) [place] 3)  [place] [address], [place] [phone]  [phone]   [place] [phone] or [phone] (After Hours)

## 2014-11-07 NOTE — ED Provider Notes (Signed)
CSN: [id number]     Arrival date & time 11/07/14  0174 History  This chart was scribed for Becky Essex, MD by Einar Pheasant, ED Scribe. This patient was seen in room APA18/[facility]18 and the patient's care was started at 7:52 AM.    Chief Complaint  Patient presents with  . Shortness of Breath  . Back Pain    The history is provided by the patient. No language interpreter was used.   HPI Comments: Becky [name] is a 53 y.o. female with a PMhx of HTN and bronchitis presents to the Emergency Department complaining of sudden onset SOB that started last night. Pt also endorses associated productive cough and a burning sensation below her left breast when deep breathing. She states that she has been seen here at the ED for bronchitis several times over the course of this year. She is also complaining of lower back pain with onset 2 days ago. Pt states that she walks a lot but she does not recall injuring it or having any falls. She states that the back pain occasionally wraps around to her abdomen. Pt endorses leak of urine after an episode of forceful coughing, but no incontinence. Denies any fecal incontinence, bladder incontinence, medicinal allergies, taking birth control, numbness, paraesthesia, using tobacco, hx of heart problems, abdominal pain, hx of back problems, getting a flu vaccine this season, dysuria, or hematuria. Pt does not have a PCP.   Past Medical History  Diagnosis Date  . Hypertension   . Bronchitis    History reviewed. No pertinent past surgical history. History reviewed. No pertinent family history. History  Substance Use Topics  . Smoking status: Never Smoker   . Smokeless tobacco: Not on file  . Alcohol Use: Yes   OB History    No data available     Review of Systems A complete 10 system review of systems was obtained and all systems are negative except as noted in the HPI and PMH.     Allergies  Review of patient's allergies indicates no known  allergies.  Home Medications   Prior to Admission medications   Medication Sig Start Date End Date Taking? Authorizing Provider  dexamethasone (DECADRON) 4 MG tablet 1 po bid with food Patient not taking: Reported on 11/07/2014 09/06/14   Lenox Ahr, PA-C  HYDROcodone-acetaminophen (NORCO/VICODIN) 5-325 MG per tablet Take 2 tablets by mouth every 4 (four) hours as needed. 11/07/14   Becky Essex, MD  levofloxacin (LEVAQUIN) 500 MG tablet Take 1 tablet (500 mg total) by mouth daily. 11/07/14   Becky Essex, MD  oxyCODONE-acetaminophen (PERCOCET) 5-325 MG per tablet Take 2 tablets by mouth every 4 (four) hours as needed. Patient not taking: Reported on 11/07/2014 09/22/14   [name], MD  oxyCODONE-acetaminophen (PERCOCET) 5-325 MG per tablet Take 2 tablets by mouth every 4 (four) hours as needed. Patient not taking: Reported on 11/07/2014 09/22/14   [name], MD  predniSONE (DELTASONE) 50 MG tablet One half tablets for 3 days, one tablet for 3 days, half a tablet for 3 days. Patient not taking: Reported on 11/07/2014 09/22/14   [name], MD   BP 132/100 mmHg  Pulse 73  Temp(Src) 97.7 F (36.5 C) (Oral)  Resp 20  Ht 5\' 9"  (1.753 m)  Wt 165 lb (74.844 kg)  BMI 24.36 kg/m2  SpO2 94%   Physical Exam  Constitutional: [name], place, and time. She appears well-developed and well-nourished. No distress.  HENT:  Head:  Normocephalic and atraumatic.  Mouth/Throat: Oropharynx is clear and moist. No oropharyngeal exudate.  Eyes: Conjunctivae and EOM are normal. Pupils are equal, round, and reactive to light.  Neck: Normal range of motion. Neck supple.  No meningismus.  Cardiovascular: Normal rate, regular rhythm, normal heart sounds and intact distal pulses.   No murmur heard. Pulmonary/Chest: Effort normal and breath sounds normal. No respiratory distress.  Frequent dry cough. Scattered expiratory wheezes.   Abdominal: Soft. There is no tenderness. There is  no rebound and no guarding.  Musculoskeletal: Normal range of motion. She exhibits no edema or tenderness.  Lower lumbar tenderness. 5/5 strength in bilateral lower extremities. Ankle plantar and dorsiflexion intact. Great toe extension intact bilaterally. +2 DP and PT pulses. +2 patellar reflexes bilaterally. Normal gait.   Neurological: She is alert and oriented to person, place, and time. No cranial nerve deficit. She exhibits normal muscle tone. Coordination normal.  No ataxia on finger to nose bilaterally. No pronator drift. 5/5 strength throughout. CN 2-12 intact. Negative Romberg. Equal grip strength. Sensation intact. Gait is normal.   Skin: Skin is warm.  Psychiatric: She has a normal mood and affect. Her behavior is normal.  Nursing note and vitals reviewed.   ED Course  Procedures (including critical care time)  DIAGNOSTIC STUDIES: Oxygen Saturation is 94% on RA, low by my interpretation.    COORDINATION OF CARE: 8:03 AM- Will order a CXR and give pt a breathing treatment. Pt advised of plan for treatment and pt agrees.  Medications  ipratropium-albuterol (DUONEB) 0.5-2.5 (3) MG/3ML nebulizer solution 3 mL (0 mLs Nebulization Duplicate 07/05/47 1856)  albuterol (PROVENTIL) (2.5 MG/3ML) 0.083% nebulizer solution 2.5 mg (2.5 mg Nebulization Given 11/07/14 0804)  ipratropium-albuterol (DUONEB) 0.5-2.5 (3) MG/3ML nebulizer solution 3 mL (3 mLs Nebulization Given 11/07/14 0805)  ibuprofen (ADVIL,MOTRIN) tablet 800 mg (800 mg Oral Given 11/07/14 0806)  HYDROcodone-acetaminophen (NORCO/VICODIN) 5-325 MG per tablet 1 tablet (1 tablet Oral Given 11/07/14 0806)  cefTRIAXone (ROCEPHIN) 1 g in dextrose 5 % 50 mL IVPB (0 g Intravenous Stopped 11/07/14 0957)  iohexol (OMNIPAQUE) 350 MG/ML injection 100 mL (100 mLs Intravenous Contrast Given 11/07/14 1142)    Labs Review Labs Reviewed  URINALYSIS, ROUTINE W REFLEX MICROSCOPIC - Abnormal; Notable for the following:    Specific Gravity,  Urine >1.030 (*)    Hgb urine dipstick TRACE (*)    Nitrite POSITIVE (*)    All other components within normal limits  CBC WITH DIFFERENTIAL - Abnormal; Notable for the following:    RBC 5.23 (*)    Neutrophils Relative % 40 (*)    Lymphocytes Relative 48 (*)    All other components within normal limits  BASIC METABOLIC PANEL - Abnormal; Notable for the following:    Glucose, Bld 114 (*)    GFR calc non Af Amer 67 (*)    GFR calc Af Amer 77 (*)    All other components within normal limits  URINE MICROSCOPIC-ADD ON - Abnormal; Notable for the following:    Bacteria, UA MANY (*)    All other components within normal limits  D-DIMER, QUANTITATIVE - Abnormal; Notable for the following:    D-Dimer, Quant 0.72 (*)    All other components within normal limits  TROPONIN I  PRO B NATRIURETIC PEPTIDE  TROPONIN I    Imaging Review Dg Chest 2 View  11/07/2014   CLINICAL DATA:  Productive cough  EXAM: CHEST  2 VIEW  COMPARISON:  09/06/2014  FINDINGS: Cardiomediastinal silhouette is  stable. Mild hyperinflation. No acute infiltrate or pulmonary edema. Mild perihilar bronchitic changes. Mild degenerative changes mid thoracic spine.  IMPRESSION: No acute infiltrate or pulmonary edema. Mild perihilar bronchitic changes.   Electronically Signed   By: Lahoma Crocker M.D.   On: 11/07/2014 09:00   Dg Lumbar Spine Complete  11/07/2014   CLINICAL DATA:  Back pain for 2 days  EXAM: LUMBAR SPINE - COMPLETE 4+ VIEW  COMPARISON:  None.  FINDINGS: Five views of lumbar spine submitted. No acute fracture or subluxation. Mild anterior spurring lower endplate of L2 and L3 vertebral body. Alignment and vertebral body heights are preserved. There is mild disc space flattening at L5-S1 level.  IMPRESSION: No acute fracture or subluxation. Mild disc space flattening at L5-S1 level.   Electronically Signed   By: Lahoma Crocker M.D.   On: 11/07/2014 09:42   Ct Angio Chest Pe W/cm &/or Wo Cm  11/07/2014   CLINICAL DATA:   History of hypertension and bronchitis. Sudden onset of shortness of breath.  EXAM: CT ANGIOGRAPHY CHEST WITH CONTRAST  TECHNIQUE: Multidetector CT imaging of the chest was performed using the standard protocol during bolus administration of intravenous contrast. Multiplanar CT image reconstructions and MIPs were obtained to evaluate the vascular anatomy.  CONTRAST:  169mL OMNIPAQUE IOHEXOL 350 MG/ML SOLN  COMPARISON:  07/18/2004  FINDINGS: Negative for pulmonary embolism. No evidence for chest lymphadenopathy. No significant pericardial or pleural fluid. Images of the upper abdomen are unremarkable.  The trachea and mainstem bronchi are patent. There is a 1 cm rounded opacity at the posterior right lung base on sequence 5, image 82, this is new from the previous examination. Linear density near the right minor fissure on sequence 5, image 50. This linear density appears unchanged from 2005 and likely related to scar. Mild thickening along the left major fissure on sequence 5, image 49. No acute bone abnormality.  Review of the MIP images confirms the above findings.  IMPRESSION: Negative for pulmonary embolism.  There is a 1 cm opacity in the posterior right lower lobe. Findings could be related to focal atelectasis or even a small area of infection. This is new from the previous examination. Due to the rounded nature of this area, recommend a 3 month follow up CT to ensure resolution.   Electronically Signed   By: Markus Daft M.D.   On: 11/07/2014 12:05     EKG Interpretation   Date/Time:  Friday November 07 2014 12:24:48 EST Ventricular Rate:  63 PR Interval:  128 QRS Duration: 94 QT Interval:  424 QTC Calculation: 434 R Axis:   94 Text Interpretation:  Sinus rhythm Borderline right axis deviation No  significant change was found Confirmed by Wyvonnia Dusky  MD, Yusra Ravert [phone]) on  11/07/2014 12:43:17 PM      MDM   Final diagnoses:  Back pain  Chest pain  Bronchitis  Urinary tract infection with  hematuria, site unspecified   Patient complains of low back pain 2 days without injury. Also has shortness of breath and cough since last night and feels that she has bronchitis. Endorses burning pain in chest with coughing.   EKG with nonspecific T-wave changes inferior laterally, similar to 2009. Chest x-ray negative for infiltrate. Troponin negative.  Neurologically intact. No evidence of cord compression or cauda equina. Patient admits to some bladder leakage with coughing only. No incontinence.  Treat UTI. No evidence of cord compression or cauda equina. Antibiotic should cover bronchitis as well.  Troponin negative 2.  CT angiogram negative for PE. Does show area of increased opacity atelectasis versus infection. Patient aware of recommendation for repeat scan in 3 months.  Her chest pain is atypical for ACS. Her EKG is similar to previous. We'll discharge with treatment for bronchitis and UTI. Follow-up with PCP. Return precautions discussed.   I personally performed the services described in this documentation, which was scribed in my presence. The recorded information has been reviewed and is accurate.    Becky Essex, MD 11/07/14 [phone]

## 2014-11-09 ENCOUNTER — Emergency Department (HOSPITAL_COMMUNITY): Payer: Medicaid Other

## 2014-11-09 ENCOUNTER — Encounter (HOSPITAL_COMMUNITY): Payer: Self-pay | Admitting: Emergency Medicine

## 2014-11-09 ENCOUNTER — Emergency Department (HOSPITAL_COMMUNITY)
Admission: EM | Admit: 2014-11-09 | Discharge: 2014-11-09 | Disposition: A | Discharge status: Home / Self Care | Payer: Medicaid Other | Attending: Emergency Medicine | Admitting: Emergency Medicine

## 2014-11-09 DIAGNOSIS — R05 Cough: Secondary | ICD-10-CM

## 2014-11-09 DIAGNOSIS — R42 Dizziness and giddiness: Secondary | ICD-10-CM | POA: Insufficient documentation

## 2014-11-09 DIAGNOSIS — R062 Wheezing: Secondary | ICD-10-CM

## 2014-11-09 DIAGNOSIS — Z87891 Personal history of nicotine dependence: Secondary | ICD-10-CM | POA: Insufficient documentation

## 2014-11-09 DIAGNOSIS — I10 Essential (primary) hypertension: Secondary | ICD-10-CM | POA: Diagnosis not present

## 2014-11-09 DIAGNOSIS — J40 Bronchitis, not specified as acute or chronic: Secondary | ICD-10-CM | POA: Diagnosis not present

## 2014-11-09 DIAGNOSIS — J209 Acute bronchitis, unspecified: Secondary | ICD-10-CM

## 2014-11-09 DIAGNOSIS — R059 Cough, unspecified: Secondary | ICD-10-CM

## 2014-11-09 DIAGNOSIS — J42 Unspecified chronic bronchitis: Secondary | ICD-10-CM

## 2014-11-09 MED ORDER — IPRATROPIUM-ALBUTEROL 0.5-2.5 (3) MG/3ML IN SOLN
3.0000 mL | Freq: Once | RESPIRATORY_TRACT | Status: AC
  Administered 2014-11-09: 3 mL via RESPIRATORY_TRACT
  Filled 2014-11-09: qty 3

## 2014-11-09 MED ORDER — ALBUTEROL SULFATE HFA 108 (90 BASE) MCG/ACT IN AERS
2.0000 | INHALATION_SPRAY | Freq: Once | RESPIRATORY_TRACT | Status: AC
  Administered 2014-11-09: 2 via RESPIRATORY_TRACT
  Filled 2014-11-09: qty 6.7

## 2014-11-09 MED ORDER — PREDNISONE 10 MG PO TABS
ORAL_TABLET | ORAL | Status: DC
Start: 2014-11-09 — End: 2015-02-15

## 2014-11-09 MED ORDER — KETOROLAC TROMETHAMINE 30 MG/ML IJ SOLN
30.0000 mg | Freq: Once | INTRAMUSCULAR | Status: AC
  Administered 2014-11-09: 30 mg via INTRAVENOUS
  Filled 2014-11-09: qty 1

## 2014-11-09 MED ORDER — OXYMETAZOLINE HCL 0.05 % NA SOLN
1.0000 | Freq: Once | NASAL | Status: AC
  Administered 2014-11-09: 1 via NASAL
  Filled 2014-11-09: qty 15

## 2014-11-09 MED ORDER — PROCHLORPERAZINE EDISYLATE 5 MG/ML IJ SOLN
10.0000 mg | Freq: Once | INTRAMUSCULAR | Status: AC
  Administered 2014-11-09: 10 mg via INTRAVENOUS
  Filled 2014-11-09: qty 2

## 2014-11-09 MED ORDER — METHYLPREDNISOLONE SODIUM SUCC 125 MG IJ SOLR
125.0000 mg | Freq: Once | INTRAMUSCULAR | Status: AC
  Administered 2014-11-09: 125 mg via INTRAVENOUS
  Filled 2014-11-09: qty 2

## 2014-11-09 NOTE — Discharge Instructions (Signed)
Your chest xray is negative for acute changes or problem. Your exam suggest bronchitis. Please continue your levoquin and norco as ordered. Use tylenol or ibuprofen for less severe pain.Use AFRIN 2 sprays  Every 8 hours for 5 days ONLY.  Use prednisone daily with food. Acute Bronchitis Bronchitis is when the airways that extend from the windpipe into the lungs get red, puffy, and painful (inflamed). Bronchitis often causes thick spit (mucus) to develop. This leads to a cough. A cough is the most common symptom of bronchitis. In acute bronchitis, the condition usually begins suddenly and goes away over time (usually in 2 weeks). Smoking, allergies, and asthma can make bronchitis worse. Repeated episodes of bronchitis may cause more lung problems. HOME CARE  Rest.  Drink enough fluids to keep your pee (urine) clear or pale yellow (unless you need to limit fluids as told by your doctor).  Only take over-the-counter or prescription medicines as told by your doctor.  Avoid smoking and secondhand smoke. These can make bronchitis worse. If you are a smoker, think about using nicotine gum or skin patches. Quitting smoking will help your lungs heal faster.  Reduce the chance of getting bronchitis again by:  Washing your hands often.  Avoiding people with cold symptoms.  Trying not to touch your hands to your mouth, nose, or eyes.  Follow up with your doctor as told. GET HELP IF: Your symptoms do not improve after 1 week of treatment. Symptoms include:  Cough.  Fever.  Coughing up thick spit.  Body aches.  Chest congestion.  Chills.  Shortness of breath.  Sore throat. GET HELP RIGHT AWAY IF:   You have an increased fever.  You have chills.  You have severe shortness of breath.  You have bloody thick spit (sputum).  You throw up (vomit) often.  You lose too much body fluid (dehydration).  You have a severe headache.  You faint. MAKE SURE YOU:   Understand these  instructions.  Will watch your condition.  Will get help right away if you are not doing well or get worse. Document Released: 04/25/2008 Document Revised: 07/10/2013 Document Reviewed: 04/30/2013 [facility] Patient Information 2015 [place], [place]. This information is not intended to replace advice given to you by your health care provider. Make sure you discuss any questions you have with your health care provider.

## 2014-11-09 NOTE — ED Notes (Signed)
Patient reports being seen here in ER on 11/07/14. Patient diagnosed with UTI and bronchitis. Patient given Levaquin and Hydrocodone. Per patient cough worse, now vomiting, and has headache. Per patient vomited x2. Patient also reports feeling dizzy since starting medication.

## 2014-11-09 NOTE — ED Provider Notes (Signed)
CSN: [id number]     Arrival date & time 11/09/14  0745 History   First MD Initiated Contact with Patient 11/09/14 0802     Chief Complaint  Patient presents with  . Cough     (Consider location/radiation/quality/duration/timing/severity/associated sxs/prior Treatment) Patient is a 53 y.o. female presenting with cough. The history is provided by the patient.  Cough Cough characteristics:  Non-productive Severity:  Moderate Onset quality:  Gradual Duration:  4 days Timing:  Intermittent Progression:  Worsening Chronicity:  Recurrent Smoker: no   Context: sick contacts, upper respiratory infection and weather changes   Relieved by:  Nothing Worsened by:  Nothing tried Ineffective treatments:  Rest (antibiotics) Associated symptoms: chest pain, fever, headaches, shortness of breath, sinus congestion and wheezing   Associated symptoms: no eye discharge   Risk factors: no recent travel     Past Medical History  Diagnosis Date  . Hypertension   . Bronchitis    History reviewed. No pertinent past surgical history. History reviewed. No pertinent family history. History  Substance Use Topics  . Smoking status: Former Smoker -- 0.50 packs/day for 30 years    Types: Cigarettes  . Smokeless tobacco: Never Used  . Alcohol Use: Yes     Comment: occasional   OB History    No data available     Review of Systems  Constitutional: Positive for fever. Negative for activity change.       All ROS Neg except as noted in HPI  HENT: Positive for congestion and postnasal drip. Negative for nosebleeds.   Eyes: Negative for photophobia and discharge.  Respiratory: Positive for cough, shortness of breath and wheezing.   Cardiovascular: Positive for chest pain. Negative for palpitations.  Gastrointestinal: Negative for abdominal pain and blood in stool.  Genitourinary: Negative for dysuria, frequency and hematuria.  Musculoskeletal: Negative for back pain, arthralgias and neck pain.   Skin: Negative.   Neurological: Positive for light-headedness and headaches. Negative for dizziness, seizures and speech difficulty.  Psychiatric/Behavioral: Negative for hallucinations and confusion.      Allergies  Review of patient's allergies indicates no known allergies.  Home Medications   Prior to Admission medications   Medication Sig Start Date End Date Taking? Authorizing Provider  dexamethasone (DECADRON) 4 MG tablet 1 po bid with food Patient not taking: Reported on 11/07/2014 09/06/14   Lenox Ahr, PA-C  HYDROcodone-acetaminophen (NORCO/VICODIN) 5-325 MG per tablet Take 2 tablets by mouth every 4 (four) hours as needed. 11/07/14   Ezequiel Essex, MD  levofloxacin (LEVAQUIN) 500 MG tablet Take 1 tablet (500 mg total) by mouth daily. 11/07/14   Ezequiel Essex, MD  oxyCODONE-acetaminophen (PERCOCET) 5-325 MG per tablet Take 2 tablets by mouth every 4 (four) hours as needed. Patient not taking: Reported on 11/07/2014 09/22/14   [name], MD  oxyCODONE-acetaminophen (PERCOCET) 5-325 MG per tablet Take 2 tablets by mouth every 4 (four) hours as needed. Patient not taking: Reported on 11/07/2014 09/22/14   [name], MD  predniSONE (DELTASONE) 50 MG tablet One half tablets for 3 days, one tablet for 3 days, half a tablet for 3 days. Patient not taking: Reported on 11/07/2014 09/22/14   [name], MD   BP 137/98 mmHg  Pulse 87  Temp(Src) 99.3 F (37.4 C) (Oral)  Resp 20  Ht 5\' 9"  (1.753 m)  Wt 165 lb (74.844 kg)  BMI 24.36 kg/m2  SpO2 96% Physical Exam  Constitutional: [name], place, and time. She appears well-developed and  well-nourished.  Non-toxic appearance.  HENT:  Head: Normocephalic.  Right Ear: Tympanic membrane and external ear normal.  Left Ear: Tympanic membrane and external ear normal.  Nasal congestion present  Increase redness of the right posterior pharynx. Uvula midline. Airway patent.    Eyes: EOM and lids are normal.  Pupils are equal, round, and reactive to light.  Neck: Normal range of motion. Neck supple. Carotid bruit is not present.  Cardiovascular: Normal rate, regular rhythm, normal heart sounds, intact distal pulses and normal pulses.   Pulmonary/Chest: No accessory muscle usage. No respiratory distress. She has wheezes. She has rhonchi.  Symmetrical rise and fall of the chest.  Abdominal: Soft. Bowel sounds are normal. There is no tenderness. There is no guarding.  Musculoskeletal: Normal range of motion.  No edema present.  Lymphadenopathy:       Head (right side): No submandibular adenopathy present.       Head (left side): No submandibular adenopathy present.    She has no cervical adenopathy.  Neurological: She is alert and oriented to person, place, and time. She has normal strength. No cranial nerve deficit or sensory deficit.  Skin: Skin is warm and dry.  Psychiatric: She has a normal mood and affect. Her speech is normal.  Nursing note and vitals reviewed.   ED Course  Procedures (including critical care time) Labs Review Labs Reviewed - No data to display  Imaging Review Dg Chest 2 View  11/07/2014   CLINICAL DATA:  Productive cough  EXAM: CHEST  2 VIEW  COMPARISON:  09/06/2014  FINDINGS: Cardiomediastinal silhouette is stable. Mild hyperinflation. No acute infiltrate or pulmonary edema. Mild perihilar bronchitic changes. Mild degenerative changes mid thoracic spine.  IMPRESSION: No acute infiltrate or pulmonary edema. Mild perihilar bronchitic changes.   Electronically Signed   By: Lahoma Crocker M.D.   On: 11/07/2014 09:00   Dg Lumbar Spine Complete  11/07/2014   CLINICAL DATA:  Back pain for 2 days  EXAM: LUMBAR SPINE - COMPLETE 4+ VIEW  COMPARISON:  None.  FINDINGS: Five views of lumbar spine submitted. No acute fracture or subluxation. Mild anterior spurring lower endplate of L2 and L3 vertebral body. Alignment and vertebral body heights are preserved. There is mild disc space  flattening at L5-S1 level.  IMPRESSION: No acute fracture or subluxation. Mild disc space flattening at L5-S1 level.   Electronically Signed   By: Lahoma Crocker M.D.   On: 11/07/2014 09:42   Ct Angio Chest Pe W/cm &/or Wo Cm  11/07/2014   CLINICAL DATA:  History of hypertension and bronchitis. Sudden onset of shortness of breath.  EXAM: CT ANGIOGRAPHY CHEST WITH CONTRAST  TECHNIQUE: Multidetector CT imaging of the chest was performed using the standard protocol during bolus administration of intravenous contrast. Multiplanar CT image reconstructions and MIPs were obtained to evaluate the vascular anatomy.  CONTRAST:  125mL OMNIPAQUE IOHEXOL 350 MG/ML SOLN  COMPARISON:  07/18/2004  FINDINGS: Negative for pulmonary embolism. No evidence for chest lymphadenopathy. No significant pericardial or pleural fluid. Images of the upper abdomen are unremarkable.  The trachea and mainstem bronchi are patent. There is a 1 cm rounded opacity at the posterior right lung base on sequence 5, image 82, this is new from the previous examination. Linear density near the right minor fissure on sequence 5, image 50. This linear density appears unchanged from 2005 and likely related to scar. Mild thickening along the left major fissure on sequence 5, image 49. No acute bone abnormality.  Review of the MIP images confirms the above findings.  IMPRESSION: Negative for pulmonary embolism.  There is a 1 cm opacity in the posterior right lower lobe. Findings could be related to focal atelectasis or even a small area of infection. This is new from the previous examination. Due to the rounded nature of this area, recommend a 3 month follow up CT to ensure resolution.   Electronically Signed   By: Markus Daft M.D.   On: 11/07/2014 12:05     EKG Interpretation None      MDM  Previous ED visit reviewed. Previous chest xray and CT angio reviewed by me.  Exam suggest bronchitis. Repeat chest xray is negative for acute problem.  Pt much  improved after DuoNeb and steroids. No n/v after compazine. Suspect bronchitis exacerbation  Rx for albuterol, and prednisone given to the patient. Pt to finish the Levaquin and norco. Pt to return if any changes or problem.   Final diagnoses:  Cough  Wheezes    **I have reviewed nursing notes, vital signs, and all appropriate lab and imaging results for this patient.Lenox Ahr, PA-C 11/09/14 [place], MD 11/12/14 [phone]

## 2014-12-01 ENCOUNTER — Encounter (HOSPITAL_COMMUNITY): Payer: Self-pay | Admitting: Emergency Medicine

## 2014-12-01 ENCOUNTER — Emergency Department (HOSPITAL_COMMUNITY)
Admission: EM | Admit: 2014-12-01 | Discharge: 2014-12-01 | Disposition: A | Discharge status: Home / Self Care | Payer: Medicaid Other | Attending: Emergency Medicine | Admitting: Emergency Medicine

## 2014-12-01 DIAGNOSIS — Z8709 Personal history of other diseases of the respiratory system: Secondary | ICD-10-CM | POA: Diagnosis not present

## 2014-12-01 DIAGNOSIS — Z87891 Personal history of nicotine dependence: Secondary | ICD-10-CM | POA: Diagnosis not present

## 2014-12-01 DIAGNOSIS — I10 Essential (primary) hypertension: Secondary | ICD-10-CM | POA: Diagnosis present

## 2014-12-01 DIAGNOSIS — K047 Periapical abscess without sinus: Secondary | ICD-10-CM | POA: Diagnosis not present

## 2014-12-01 MED ORDER — HYDROCODONE-ACETAMINOPHEN 5-325 MG PO TABS
1.0000 | ORAL_TABLET | Freq: Once | ORAL | Status: AC
  Administered 2014-12-01: 1 via ORAL
  Filled 2014-12-01: qty 1

## 2014-12-01 MED ORDER — HYDROCODONE-ACETAMINOPHEN 5-325 MG PO TABS: 1.0000 | ORAL_TABLET | Freq: Four times a day (QID) | ORAL | Status: DC | PRN

## 2014-12-01 MED ORDER — PENICILLIN V POTASSIUM 500 MG PO TABS: 500.0000 mg | ORAL_TABLET | Freq: Four times a day (QID) | ORAL | Status: DC

## 2014-12-01 NOTE — ED Provider Notes (Signed)
CSN: [id number]     Arrival date & time 12/01/14  1004 History  This chart was scribed for [name], MD by Zola Button, ED Scribe. This patient was seen in room APA07/APA07 and the patient's care was started at 12:39 PM.     Chief Complaint  Patient presents with  . Hypertension   Patient is a 54 y.o. female presenting with hypertension. The history is provided by the patient. No language interpreter was used.  Hypertension This is a chronic problem. The current episode started more than 1 week ago. The problem occurs constantly. The problem has not changed since onset.Associated symptoms include headaches. Pertinent negatives include no abdominal pain and no shortness of breath. Nothing aggravates the symptoms. Nothing relieves the symptoms. She has tried nothing for the symptoms.   HPI Comments: Becky [name] is a 54 y.o. female with a hx of HTN who presents to the Emergency Department complaining of blood pressure elevation. Patient reports have severe right upper dental pain. She has a dental appointment tomorrow, but her dentist will not pull teeth unless her BP is controlled. The pain is described as a 10/10 in severity. She also reports having mild HA and lightheadedness. Patient has not been on taking HTN medications recently and has not been prescribed any recently. She denies allergies to penicillin.  No PCP per patient  Past Medical History  Diagnosis Date  . Hypertension   . Bronchitis    History reviewed. No pertinent past surgical history. No family history on file. History  Substance Use Topics  . Smoking status: Former Smoker -- 0.00 packs/day for 30 years    Types: Cigarettes  . Smokeless tobacco: Never Used  . Alcohol Use: Yes     Comment: occasional   OB History    No data available     Review of Systems  Constitutional: Negative for fever and chills.  HENT: Positive for dental problem.   Eyes: Negative for visual disturbance.  Respiratory: Positive  for cough. Negative for shortness of breath.   Cardiovascular: Negative for leg swelling.  Gastrointestinal: Negative for nausea, vomiting, abdominal pain and diarrhea.  Genitourinary: Negative for dysuria.  Musculoskeletal: Negative for back pain.  Skin: Negative for rash.  Neurological: Positive for light-headedness and headaches.  Hematological: Does not bruise/bleed easily.  Psychiatric/Behavioral: Negative for confusion.      Allergies  Review of patient's allergies indicates no known allergies.  Home Medications   Prior to Admission medications   Medication Sig Start Date End Date Taking? Authorizing Provider  ibuprofen (ADVIL,MOTRIN) 200 MG tablet Take 400 mg by mouth every 6 (six) hours as needed for moderate pain.   Yes Historical Provider, MD  dexamethasone (DECADRON) 4 MG tablet 1 po bid with food Patient not taking: Reported on 11/07/2014 09/06/14   Lenox Ahr, PA-C  HYDROcodone-acetaminophen (NORCO/VICODIN) 5-325 MG per tablet Take 2 tablets by mouth every 4 (four) hours as needed. Patient not taking: Reported on 12/01/2014 11/07/14   Ezequiel Essex, MD  HYDROcodone-acetaminophen (NORCO/VICODIN) 5-325 MG per tablet Take 1-2 tablets by mouth every 6 (six) hours as needed. 12/01/14   [name], MD  levofloxacin (LEVAQUIN) 500 MG tablet Take 1 tablet (500 mg total) by mouth daily. Patient not taking: Reported on 12/01/2014 11/07/14   Ezequiel Essex, MD  oxyCODONE-acetaminophen (PERCOCET) 5-325 MG per tablet Take 2 tablets by mouth every 4 (four) hours as needed. Patient not taking: Reported on 11/07/2014 09/22/14   [name], MD  oxyCODONE-acetaminophen [facility]) [id number]  MG per tablet Take 2 tablets by mouth every 4 (four) hours as needed. Patient not taking: Reported on 11/07/2014 09/22/14   [name], MD  penicillin v potassium (VEETID) 500 MG tablet Take 1 tablet (500 mg total) by mouth 4 (four) times daily. 12/01/14   [name], MD  predniSONE (DELTASONE)  10 MG tablet 5,4,3,2,1 - take with food Patient not taking: Reported on 12/01/2014 11/09/14   Lenox Ahr, PA-C   BP 168/96 mmHg  Pulse 50  Temp(Src) 98.6 F (37 C) (Oral)  Resp 14  Ht 5\' 9"  (1.753 m)  Wt 171 lb (77.565 kg)  BMI 25.24 kg/m2  SpO2 100% Physical Exam  Constitutional: [name], place, and time. She appears well-developed and well-nourished. No distress.  HENT:  Head: Normocephalic and atraumatic.  Mouth/Throat: Oropharynx is clear and moist. No oropharyngeal exudate.  Mucal swelling on right side. Redness of gums.  Eyes: Pupils are equal, round, and reactive to light.  Neck: Neck supple.  Cardiovascular: Normal rate, regular rhythm and normal heart sounds.   No murmur heard. Pulmonary/Chest: Effort normal and breath sounds normal. No respiratory distress. She has no wheezes. She has no rales.  Abdominal: Soft. Bowel sounds are normal. There is no tenderness.  Musculoskeletal: She exhibits no edema.  No ankle swelling.  Neurological: She is alert and oriented to person, place, and time. No cranial nerve deficit.  Skin: Skin is warm and dry. No rash noted.  Psychiatric: She has a normal mood and affect. Her behavior is normal.  Nursing note and vitals reviewed.   ED Course  Procedures  DIAGNOSTIC STUDIES: Oxygen Saturation is 100% on room air, normal by my interpretation.    COORDINATION OF CARE: 12:48 PM-Discussed treatment plan which includes medications with pt at bedside and pt agreed to plan.    Labs Review Labs Reviewed - No data to display  Imaging Review No results found.   EKG Interpretation None      MDM   Final diagnoses:  Tooth abscess  Essential hypertension    Patient with right lower and upper tooth pain. Predominantly upper.. The abscess molar in that area. That will be treated with penicillin and pain medication. In addition patient was told to take Motrin. Patient may have hypertensin requiring treatment.  However that is difficult diagnosis to make on one blood pressure reading from today. Recommend patient get serial blood pressure meds and follow-up with regular doctor resource guide provided. Determine whether she needs to be a long arm antihypertensive meds. Patient stated many many many years ago she was on blood pressure medicines but has not been on any any since.  Patient without any significant oral abscess no swelling to the floor the mouth. Patient is nontoxic no acute distress.  I personally performed the services described in this documentation, which was scribed in my presence. The recorded information has been reviewed and is accurate.     [name], MD 12/01/14 1343

## 2014-12-01 NOTE — ED Notes (Signed)
EDP at bedside  

## 2014-12-01 NOTE — Discharge Instructions (Signed)
Abscessed Tooth An abscessed tooth is an infection around your tooth. It may be caused by holes or damage to the tooth (cavity) or a dental disease. An abscessed tooth causes mild to very bad pain in and around the tooth. See your dentist right away if you have tooth or gum pain. HOME CARE  Take your medicine as told. Finish it even if you start to feel better.  Do not drive after taking pain medicine.  Rinse your mouth (gargle) often with salt water ( teaspoon salt in 8 ounces of warm water).  Do not apply heat to the outside of your face. GET HELP RIGHT AWAY IF:   You have a temperature by mouth above 102 F (38.9 C), not controlled by medicine.  You have chills and a very bad headache.  You have problems breathing or swallowing.  Your mouth will not open.  You develop puffiness (swelling) on the neck or around the eye.  Your pain is not helped by medicine.  Your pain is getting worse instead of better. MAKE SURE YOU:   Understand these instructions.  Will watch your condition.  Will get help right away if you are not doing well or get worse. Document Released: 04/25/2008 Document Revised: 01/30/2012 Document Reviewed: 02/15/2011 [facility] Patient Information 2015 [place], [place]. This information is not intended to replace advice given to you by your health care provider. Make sure you discuss any questions you have with your health care provider.  Take the pain medicine as needed. Take the antibiotic as directed. Follow [facility] up to the [facility] for treatment of the teeth. He will require follow-up blood pressures determine whether it is appropriate to start you on hypertensive meds. Today's blood pressures are elevated. Resource guide provided below to help you find a follow-up doctor.    Emergency Department Resource Guide 1) Find a Doctor and Pay Out of Pocket Although you won't have to find out who is covered by your insurance plan, it is a good idea to ask around and  get recommendations. You will then need to call the office and see if the doctor you have chosen will accept you as a new patient and what types of options they offer for patients who are self-pay. Some doctors offer discounts or will set up payment plans for their patients who do not have insurance, but you will need to ask so you aren't surprised when you get to your appointment.  2) Contact Your Local Health Department Not all health departments have doctors that can see patients for sick visits, but many do, so it is worth a call to see if yours does. If you don't know where your local health department is, you can check in your phone book. The CDC also has a tool to help you locate your state's health department, and many state websites also have listings of all of their local health departments.  3) Find a [facility] If your illness is not likely to be very severe or complicated, you may want to try a walk in clinic. These are popping up all over the country in pharmacies, drugstores, and shopping centers. They're usually staffed by nurse practitioners or physician assistants that have been trained to treat common illnesses and complaints. They're usually fairly quick and inexpensive. However, if you have serious medical issues or chronic medical problems, these are probably not your best option.  No Primary Care Doctor: - Call Health Connect at  [phone] - they can help you locate  a primary care doctor that  accepts your insurance, provides certain services, etc. - Physician Referral Service- [phone]  Chronic Pain Problems: Organization         Address  Phone   Notes  [facility]  [phone] Patients need to be referred by their primary care doctor.   Medication Assistance: Organization         Address  Phone   Notes  [facility] Medication [facility] [place], [place] [address] [phone] --Must be a resident of  [facility] -- Must have NO insurance coverage whatsoever (no Medicaid/ Medicare, etc.) -- The pt. MUST have a primary care doctor that directs their care regularly and follows them in the community   Med[facility]ist  [phone]   [employer]  [phone]    Agencies that provide inexpensive medical care: Organization         Address  Phone   Notes  [place]  [phone]   [facility]    [phone]   [facility] [place], [place] [address] [phone]   [place] [address] [place] [phone]   Planned Parenthood    [phone]   [facility]    [phone]   [place] and [place] [address], [place] Phone:  [phone], Fax:  [phone] Hours of Operation:  9 am - 6 pm, M-F.  Also accepts Medicaid/Medicare and self-pay.  [facility] for [place], [address], [place] Phone: [phone], Fax: [phone]. Hours of Operation:  8:30 am - 5:30 pm, M-F.  Also accepts Medicaid and self-pay.  [facility] [place] [address] [place] Phone: [phone]   [place], [place], [place] [phone], Ext. [phone] Mondays & Thursdays: 7-9 AM.  First 15 patients are seen on a first come, first serve basis.    [place] Providers:  Organization         Address  Phone   Notes  [facility] [address],  [phone] Also accepts self-pay patients.  [facility] [address], [place]  [phone]   [place], [address], [place] [phone]   [facility] [address] [place] [phone]   Lucianne Lei [address], [place]   [phone] Only accepts [place] Access  [place] patients after they have their name applied to their card.   Self-Pay (no insurance) in [facility]:  Organization         Address  Phone   Notes  Sickle Cell Patients, [facility] Internal Medicine [place] [phone]   [facility] [place] [phone]   [facility] [place]  [place], [place], [place] [phone]   [facility]/Dr. Osei-Bonsu  [address], [place] or [address], [place] [phone] Phone number for both [place] and [place] locations is the same.  [facility] [address] [place] [phone]   [facility] [address], [place] or [address] [phone] (  [phone]   [place] [phone], phone; [phone], fax Sees patients 1st and 3rd Saturday of every month.  Must not qualify for public or private insurance (i.e. Medicaid, Medicare, [place] Health Choice, Veterans' Benefits)  Household income should be no more than 200% of the poverty level The clinic cannot treat you if you are pregnant or think you are pregnant  Sexually transmitted diseases are not treated at the clinic.    Dental Care: Organization         Address  Phone  Notes  [facility] Department of [facility] [place] [phone] Accepts children up to age 61 who are enrolled in [place] or [place]; pregnant women with a Medicaid card; and children who have applied for Medicaid or [place] Health Choice, but were declined, whose parents can pay a reduced fee at time of service.  [facility] Department of [facility]  [address], [place] [phone] Accepts children up to age 69 who are enrolled in [place] or [place]; pregnant women with a Medicaid  card; and children who have applied for Medicaid or [place] Health Choice, but were declined, whose parents can pay a reduced fee at time of service.  [facility] Access PROGRAM  [place] [phone] Patients are seen by appointment only. Walk-ins are not accepted. [place] will see patients 56 years of age and older. Monday - Tuesday (8am-5pm) Most Wednesdays (8:30-5pm) $30 per visit, cash only  [facility] Adult Dental Access PROGRAM  [address], [facility] [phone] Patients are seen by appointment only. Walk-ins are not accepted. [place] will see patients 46 years of age and older. One Wednesday Evening (Monthly: Volunteer Based).  $30 per visit, cash only  [place]  [phone] for adults; Children under age 59, call Graduate Pediatric Dentistry at [phone]. Children aged 59-14, please call [phone] to request a pediatric application.  Dental services are provided in all areas of dental care including fillings, crowns and bridges, complete and partial dentures, implants, gum treatment, root canals, and extractions. Preventive care is also provided. Treatment is provided to both adults and children. Patients are selected via a lottery and there is often a waiting list.   [facility] [address], [place]  [phone] [email or web address]   [facility] [address] [place], [place] [phone], Ext. [phone] Second and Fourth Thursday of each month, opens at 6:30 AM; Clinic ends at 9 AM.  Patients are seen on a first-come first-served basis, and a limited number are seen during each clinic.   [facility]  [address] Hillard Danker [place], [place] [phone]   Eligibility Requirements You must have lived in [place], [place], or [place] counties for at least the last three months.   You cannot be eligible for state or federal sponsored [employer],  including [employer], [place], or [employer].   You generally cannot be eligible for healthcare insurance through your employer.    How to apply: Eligibility screenings are held every Tuesday and Wednesday afternoon from 1:00 pm until 4:00 pm. You do not need an appointment for the interview!  [facility] [address], [place], [place]   [place]  [phone]   [place] Department  [phone]  [place] in the Community: Intensive Outpatient Programs Organization         Address  Phone  Notes  [place]. [address] [place], [place] [phone]   [facility] Outpatient [address] [place], [place]   ADS: Alcohol & Drug Svcs [address], [place], [place]   [place] [address],  [place], [place] or [phone]   Substance Abuse Resources Organization         Address  Phone  Notes  Alcohol and Drug Services  [phone]   [place]  [phone]   [facility] [place]   Chinita Pester  [phone]   Residential & Outpatient Substance Abuse Program  [phone]   Psychological Services Organization         Address  Phone  Notes  [facility] [place]  [place]  [phone]   [place] [address], [place] or [phone]    Mobile Crisis Teams Organization         Address  Phone  Notes  [facility], Mobile Crisis Care Unit  [phone]   [facility] Psych[facility] Services  [address] [place], [place]   [place] Levels [address] [place] [phone]    Self-Help/Support Groups Organization         Address  Phone             Notes  [place].  of [place] - variety of support groups  [place] Call for more information  Narcotics Anonymous (NA), [facility] [address], [employer] [place]  2 meetings at this location   Special educational needs teacher         Address  Phone  Notes  ASAP Residential Treatment [place],    [place]  [phone]   [facility]  [address] [place] [phone], [place], [place]   Carlos [place], [place] [phone] Admissions: 8am-3pm M-F  Incentives Substance [place] [address],    [place], [place] [phone]   [facility] [address] [place], [place], [place]   [facility] [address],  [place], [place]   [facility]ight Programs - Intensive Outpatient [address], [place], [place]   [facility] ([place].,  [place], [place] [phone] or [phone]   [facility] Services ([facility]) [address], [place], [place] Accepts Medicaid  [facility] [place] [address]  [place] [phone] Substance Abuse/Addiction Treatment   [facility] Organization         Address  Phone  Notes  [facility]  [phone]   Domenic Schwab, PhD [address] [place], [place]   [phone] or [phone]   [place], [place] [phone]   [place] [address] [place], [place] [phone] Insurance/Medicaid/sponsorship through [employer] and Families [address], [place]                                    [place], [place] [phone] Therapy/tele-psych/case  [facility] [place], [place] [phone]    Dr. Adele Schilder  [phone]   [facility] Dept. 1) [address] [place] 2)  [place] 3)  [place] [address], [place] [phone]  [phone]   [place] [phone] or [phone] (After Hours)

## 2014-12-01 NOTE — ED Notes (Signed)
Pt have dental pain, head spinning and lightheaded,  has Dental appointment in the morning, pt state her BP is elevated and dentist will not pull teeth until BP is controlled.

## 2015-02-12 ENCOUNTER — Emergency Department (HOSPITAL_COMMUNITY): Payer: MEDICAID

## 2015-02-12 ENCOUNTER — Encounter (HOSPITAL_COMMUNITY): Payer: Self-pay | Admitting: Registered Nurse

## 2015-02-12 ENCOUNTER — Inpatient Hospital Stay (HOSPITAL_COMMUNITY)
Admission: AD | Admit: 2015-02-12 | Discharge: 2015-02-15 | DRG: 885 | Disposition: A | Discharge status: Home / Self Care | Payer: MEDICAID | Source: Intra-hospital | Attending: Psychiatry | Admitting: Psychiatry

## 2015-02-12 ENCOUNTER — Encounter (HOSPITAL_COMMUNITY): Payer: Self-pay

## 2015-02-12 ENCOUNTER — Emergency Department (HOSPITAL_COMMUNITY)
Admission: EM | Admit: 2015-02-12 | Discharge: 2015-02-12 | Disposition: A | Discharge status: Other Acute Inpatient Hospital | Payer: MEDICAID | Source: Home / Self Care | Attending: Emergency Medicine | Admitting: Emergency Medicine

## 2015-02-12 DIAGNOSIS — Z8249 Family history of ischemic heart disease and other diseases of the circulatory system: Secondary | ICD-10-CM | POA: Diagnosis not present

## 2015-02-12 DIAGNOSIS — F332 Major depressive disorder, recurrent severe without psychotic features: Secondary | ICD-10-CM | POA: Diagnosis not present

## 2015-02-12 DIAGNOSIS — I10 Essential (primary) hypertension: Secondary | ICD-10-CM | POA: Diagnosis present

## 2015-02-12 DIAGNOSIS — F32A Depression, unspecified: Secondary | ICD-10-CM | POA: Diagnosis present

## 2015-02-12 DIAGNOSIS — Z87891 Personal history of nicotine dependence: Secondary | ICD-10-CM | POA: Diagnosis not present

## 2015-02-12 DIAGNOSIS — R45851 Suicidal ideations: Secondary | ICD-10-CM | POA: Diagnosis present

## 2015-02-12 DIAGNOSIS — F329 Major depressive disorder, single episode, unspecified: Secondary | ICD-10-CM | POA: Diagnosis present

## 2015-02-12 DIAGNOSIS — F1410 Cocaine abuse, uncomplicated: Secondary | ICD-10-CM | POA: Diagnosis present

## 2015-02-12 LAB — CBC WITH DIFFERENTIAL/PLATELET
BASOS PCT: 0 % (ref 0–1)
Basophils Absolute: 0 10*3/uL (ref 0.0–0.1)
EOS ABS: 0.1 10*3/uL (ref 0.0–0.7)
Eosinophils Relative: 2 % (ref 0–5)
HCT: 45.3 % (ref 36.0–46.0)
Hemoglobin: 15 g/dL (ref 12.0–15.0)
Lymphocytes Relative: 44 % (ref 12–46)
Lymphs Abs: 2 10*3/uL (ref 0.7–4.0)
MCH: 28.7 pg (ref 26.0–34.0)
MCHC: 33.1 g/dL (ref 30.0–36.0)
MCV: 86.6 fL (ref 78.0–100.0)
MONO ABS: 0.3 10*3/uL (ref 0.1–1.0)
Monocytes Relative: 7 % (ref 3–12)
NEUTROS PCT: 47 % (ref 43–77)
Neutro Abs: 2.1 10*3/uL (ref 1.7–7.7)
Platelets: 185 10*3/uL (ref 150–400)
RBC: 5.23 MIL/uL — AB (ref 3.87–5.11)
RDW: 13.8 % (ref 11.5–15.5)
WBC: 4.5 10*3/uL (ref 4.0–10.5)

## 2015-02-12 LAB — URINALYSIS, ROUTINE W REFLEX MICROSCOPIC
Bilirubin Urine: NEGATIVE
Glucose, UA: NEGATIVE mg/dL
Hgb urine dipstick: NEGATIVE
Ketones, ur: NEGATIVE mg/dL
Leukocytes, UA: NEGATIVE
NITRITE: NEGATIVE
Protein, ur: NEGATIVE mg/dL
Specific Gravity, Urine: 1.015 (ref 1.005–1.030)
UROBILINOGEN UA: 0.2 mg/dL (ref 0.0–1.0)
pH: 6.5 (ref 5.0–8.0)

## 2015-02-12 LAB — ETHANOL

## 2015-02-12 LAB — COMPREHENSIVE METABOLIC PANEL
ALK PHOS: 58 U/L (ref 39–117)
ALT: 27 U/L (ref 0–35)
AST: 27 U/L (ref 0–37)
Albumin: 4 g/dL (ref 3.5–5.2)
Anion gap: 9 (ref 5–15)
BUN: 16 mg/dL (ref 6–23)
CALCIUM: 9.3 mg/dL (ref 8.4–10.5)
CO2: 22 mmol/L (ref 19–32)
Chloride: 108 mmol/L (ref 96–112)
Creatinine, Ser: 0.88 mg/dL (ref 0.50–1.10)
GFR, EST AFRICAN AMERICAN: 86 mL/min — AB (ref >90)
GFR, EST NON AFRICAN AMERICAN: 74 mL/min — AB (ref >90)
Glucose, Bld: 101 mg/dL — ABNORMAL HIGH (ref 70–99)
POTASSIUM: 3.9 mmol/L (ref 3.5–5.1)
SODIUM: 139 mmol/L (ref 135–145)
TOTAL PROTEIN: 7.1 g/dL (ref 6.0–8.3)
Total Bilirubin: 0.7 mg/dL (ref 0.3–1.2)

## 2015-02-12 LAB — RAPID URINE DRUG SCREEN, HOSP PERFORMED
Amphetamines: NOT DETECTED
BENZODIAZEPINES: NOT DETECTED
Barbiturates: NOT DETECTED
COCAINE: POSITIVE — AB
Opiates: NOT DETECTED
Tetrahydrocannabinol: NOT DETECTED

## 2015-02-12 LAB — SALICYLATE LEVEL: Salicylate Lvl: <4 mg/dL (ref 2.8–20.0)

## 2015-02-12 LAB — ACETAMINOPHEN LEVEL: Acetaminophen (Tylenol), Serum: <10 ug/mL — ABNORMAL LOW (ref 10–30)

## 2015-02-12 LAB — TROPONIN I

## 2015-02-12 MED ORDER — ACETAMINOPHEN 325 MG PO TABS: 650.0000 mg | ORAL_TABLET | Freq: Four times a day (QID) | ORAL | Status: DC | PRN

## 2015-02-12 MED ORDER — HYDROXYZINE HCL 50 MG PO TABS
50.0000 mg | ORAL_TABLET | Freq: Four times a day (QID) | ORAL | Status: DC | PRN
  Administered 2015-02-12: 50 mg via ORAL
  Filled 2015-02-12: qty 1

## 2015-02-12 MED ORDER — ALUM & MAG HYDROXIDE-SIMETH 200-200-20 MG/5ML PO SUSP: 30.0000 mL | ORAL | Status: DC | PRN

## 2015-02-12 MED ORDER — NICOTINE 21 MG/24HR TD PT24
21.0000 mg | MEDICATED_PATCH | Freq: Every day | TRANSDERMAL | Status: DC
  Filled 2015-02-12 (×5): qty 1

## 2015-02-12 MED ORDER — LORAZEPAM 2 MG/ML IJ SOLN
1.0000 mg | Freq: Once | INTRAMUSCULAR | Status: DC
  Filled 2015-02-12: qty 1

## 2015-02-12 MED ORDER — MAGNESIUM HYDROXIDE 400 MG/5ML PO SUSP: 30.0000 mL | Freq: Every day | ORAL | Status: DC | PRN

## 2015-02-12 MED ORDER — LORAZEPAM 2 MG/ML IJ SOLN
1.0000 mg | Freq: Once | INTRAMUSCULAR | Status: AC
  Administered 2015-02-12: 1 mg via INTRAVENOUS

## 2015-02-12 MED ORDER — ACETAMINOPHEN 325 MG PO TABS
ORAL_TABLET | ORAL | Status: AC
  Administered 2015-02-12: 650 mg
  Filled 2015-02-12: qty 2

## 2015-02-12 MED ORDER — TRAZODONE HCL 50 MG PO TABS
50.0000 mg | ORAL_TABLET | Freq: Every evening | ORAL | Status: DC | PRN
  Administered 2015-02-12: 50 mg via ORAL
  Filled 2015-02-12: qty 14
  Filled 2015-02-12: qty 1

## 2015-02-12 NOTE — ED Notes (Signed)
Patient given lunch tray.   Behavioral Health Notified of urine results.

## 2015-02-12 NOTE — ED Notes (Signed)
Patient left with [facility] [employer] at this time. No distress.

## 2015-02-12 NOTE — ED Notes (Signed)
Sprite and crackers given per request.

## 2015-02-12 NOTE — ED Notes (Signed)
Resting with eyes closed. Respirations even and unlabored. VSS. Sitter at bedside.

## 2015-02-12 NOTE — ED Notes (Signed)
Patient arrives via EMS from [employer] with c/o SI. Patient went to [employer], found by Police to be hyperventilating, crying and stating she was going to kill herself. Upon arrival to ED, patient hyperventilating, crying, semi-conscious. After several minutes, pt's breathing slowed and patient became more responsive. Patient stated to nursing staff to "Kill me, or I am going to kill myself." Nursing staff unable to get any other information from patient as to why she went to [employer] station or what prompted her to have SI or surrounding circumstances of today's events. Patient calmer at present, respirations even and unlabored. Remains tearful and will become upset during assessments.

## 2015-02-12 NOTE — ED Notes (Signed)
Urine specimen obtained and sent to lab

## 2015-02-12 NOTE — Progress Notes (Signed)
Patient ID: Becky [name], female   DOB: [birth year], 54 y.o.   MRN: [id number] D: Client reports reason for admission "I was having problems with my BF" "I had a bad attack of depression, went further than it should have and I said some things." "I was overwhelmed, but I'm all right now, I'm ready go home" "my cousin came today, she kind of fussy" Client denies suicidal ideations. A: Writer introduced self to client provided emotional support, encouraged client to verbal concerns, reviewed medications and administered as ordered.Staff will monitor q53min for safety. R: client is safe on the unit, attended karaoke.

## 2015-02-12 NOTE — ED Notes (Signed)
Telepsych machine at bedside per request.

## 2015-02-12 NOTE — ED Notes (Signed)
Patient is resting comfortably. 

## 2015-02-12 NOTE — Progress Notes (Signed)
Admission Note  D: Patient admitted to [facility] from [facility]. She presents with sad, depressed affect and mood, as well as very tearful. Patient reported that she got into a verbal altercation with her boyfriend of 11 years and became suicidal after he voiced that he was taking a 50-B restraining order out on her. She stated "I rather die than live in this world, the only thing that keeps me here are my children". Patient voices that she's been depressed for the past two weeks and have been drinking daily anywhere from four to five 40 oz. beers. Her UDS was positive for cocaine.  A: Oriented patient to the unit and informed her of the hospital's rules/policies. Initiated Q15 minute checks for safety.  R: Patient remains safe on the unit.

## 2015-02-12 NOTE — ED Notes (Signed)
[employer] reports pt walked into the police dept and was SOB.  Reports she verbalized that she was suicidal.  Pt hyperventilating upon arrival to ER.  Pt unable to answer questions.  Pt had a cell phone with her and staff was able to contact her daughter to get information.  Daughter on the way here.  Pt tearful.

## 2015-02-12 NOTE — ED Notes (Signed)
Report given to Vira Browns, RN at [facility] at this time.
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
## 2015-02-12 NOTE — ED Notes (Signed)
Daughter at bedside. She states patient has a long term relationship with a man that is ending and that patient has been dealing with depression over this for "a while".

## 2015-02-12 NOTE — Tx Team (Signed)
Initial Interdisciplinary Treatment Plan   PATIENT STRESSORS: Financial difficulties Marital or family conflict Occupational concerns Substance abuse   PATIENT STRENGTHS: Ability for insight Capable of independent living General fund of knowledge Motivation for treatment/growth   PROBLEM LIST: Problem List/Patient Goals Date to be addressed Date deferred Reason deferred Estimated date of resolution  "Depression" 02/12/15     "Alcohol Abuse" 02/12/15     "Suicidal Ideation" 02/12/15     "Homicidal Ideation" 02/12/15                                    DISCHARGE CRITERIA:  Ability to meet basic life and health needs Improved stabilization in mood, thinking, and/or behavior Motivation to continue treatment in a less acute level of care  PRELIMINARY DISCHARGE PLAN: Attend PHP/IOP Outpatient therapy Return to previous living arrangement  PATIENT/FAMIILY INVOLVEMENT: This treatment plan has been presented to and reviewed with the patient, Becky [name].  The patient have been given the opportunity to ask questions and make suggestions.  Eduard Roux E 02/12/2015, 4:55 PM

## 2015-02-12 NOTE — ED Notes (Signed)
Telepsych complete

## 2015-02-12 NOTE — BH Assessment (Signed)
Spoke with Lily Kocher, PA-C, about pt. Per Marshell Levan, Pt indorsing SI due to pending brake up with boyfriend of 11 years.   Ruben Reason, MA, LPCA, [facility]

## 2015-02-12 NOTE — ED Provider Notes (Signed)
CSN: [id number]     Arrival date & time 02/12/15  0755 History   First MD Initiated Contact with Patient 02/12/15 [phone]     Chief Complaint  Patient presents with  . V70.1     (Consider location/radiation/quality/duration/timing/severity/associated sxs/prior Treatment) HPI Comments: Patient is a 54 year old female who presents to the emergency department with suicidal on thoughts.  The patient arrived to the emergency room by EMS. The patient walked into the [employer] and said that she was suicidal. The patient states that she has had a relationship with a man for 11 years, and now they are fighting and considering a breakup. The patient states there are other things that are "just not going right" in her life. She says she does not want to live anymore, she has not thought up a plan is to how she would harm herself. She states she has had some suicidal ideas in the past, but not to this level that she would actually carry them out. Patient is tearful, particularly when she looks at the picture of the person she is having the 11 year relationship with. She says that she went to the police department to get some help because she wanted assistance before hurting herself, and her family.  The history is provided by the patient and the police.    Past Medical History  Diagnosis Date  . Hypertension   . Bronchitis    History reviewed. No pertinent past surgical history. No family history on file. History  Substance Use Topics  . Smoking status: Former Smoker -- 0.00 packs/day for 30 years    Types: Cigarettes  . Smokeless tobacco: Never Used  . Alcohol Use: Yes     Comment: occasional   OB History    No data available     Review of Systems  Respiratory: Positive for chest tightness and shortness of breath.   Neurological: Positive for headaches.  Psychiatric/Behavioral: Positive for suicidal ideas and sleep disturbance. Negative for hallucinations and confusion.  The patient is nervous/anxious.       Allergies  Review of patient's allergies indicates no known allergies.  Home Medications   Prior to Admission medications   Medication Sig Start Date End Date Taking? Authorizing Provider  dexamethasone (DECADRON) 4 MG tablet 1 po bid with food Patient not taking: Reported on 11/07/2014 09/06/14   Lily Kocher, PA-C  HYDROcodone-acetaminophen (NORCO/VICODIN) 5-325 MG per tablet Take 2 tablets by mouth every 4 (four) hours as needed. Patient not taking: Reported on 12/01/2014 11/07/14   Ezequiel Essex, MD  HYDROcodone-acetaminophen (NORCO/VICODIN) 5-325 MG per tablet Take 1-2 tablets by mouth every 6 (six) hours as needed. 12/01/14   [name], MD  ibuprofen (ADVIL,MOTRIN) 200 MG tablet Take 400 mg by mouth every 6 (six) hours as needed for moderate pain.    Historical Provider, MD  levofloxacin (LEVAQUIN) 500 MG tablet Take 1 tablet (500 mg total) by mouth daily. Patient not taking: Reported on 12/01/2014 11/07/14   Ezequiel Essex, MD  oxyCODONE-acetaminophen (PERCOCET) 5-325 MG per tablet Take 2 tablets by mouth every 4 (four) hours as needed. Patient not taking: Reported on 11/07/2014 09/22/14   [name], MD  oxyCODONE-acetaminophen (PERCOCET) 5-325 MG per tablet Take 2 tablets by mouth every 4 (four) hours as needed. Patient not taking: Reported on 11/07/2014 09/22/14   [name], MD  penicillin v potassium (VEETID) 500 MG tablet Take 1 tablet (500 mg total) by mouth 4 (four) times daily. 12/01/14   [name],  MD  predniSONE (DELTASONE) 10 MG tablet 5,4,3,2,1 - take with food Patient not taking: Reported on 12/01/2014 11/09/14   Lily Kocher, PA-C   BP 141/92 mmHg  Pulse 71  Temp(Src) 97.6 F (36.4 C) (Oral)  Resp 16  Ht 5\' 9"  (1.753 m)  Wt 170 lb (77.111 kg)  BMI 25.09 kg/m2  SpO2 100% Physical Exam  Constitutional: [name], place, and time. She appears well-developed and well-nourished.  Non-toxic  appearance.  HENT:  Head: Normocephalic.  Right Ear: Tympanic membrane and external ear normal.  Left Ear: Tympanic membrane and external ear normal.  Patient complains of headache from temporal area 24 head area to temporal area. no crepitus of the TMJ area. No evidence of trauma.  There is increased redness of the cheeks, nose, and around the mouth. There is no hematoma appreciated. There is no evidence of trauma to the teeth or tongue. The airway is patent.  Eyes: EOM and lids are normal. Pupils are equal, round, and reactive to light.  Neck: Normal range of motion. Neck supple. Carotid bruit is not present.  Cardiovascular: Normal rate, regular rhythm, normal heart sounds, intact distal pulses and normal pulses.  Exam reveals no gallop and no friction rub.   No murmur heard. Pulmonary/Chest: Breath sounds normal. No respiratory distress. She has no wheezes. She has no rales.  Abdominal: Soft. Bowel sounds are normal. She exhibits no distension. There is no tenderness. There is no guarding.  Musculoskeletal: Normal range of motion. She exhibits no edema or tenderness.  Lymphadenopathy:       Head (right side): No submandibular adenopathy present.       Head (left side): No submandibular adenopathy present.    She has no cervical adenopathy.  Neurological: She is alert and oriented to person, place, and time. She has normal strength. No cranial nerve deficit or sensory deficit. She exhibits normal muscle tone. Coordination normal.  Skin: Skin is warm and dry.  Psychiatric: She has a normal mood and affect. Her speech is normal.  Nursing note and vitals reviewed.   ED Course  9:36 - pt resting comfortably after IV ativan.  Procedures (including critical care time) Labs Review Labs Reviewed  CBC WITH DIFFERENTIAL/PLATELET  TROPONIN I  COMPREHENSIVE METABOLIC PANEL  ACETAMINOPHEN LEVEL  SALICYLATE LEVEL  ETHANOL  URINE RAPID DRUG SCREEN (HOSP PERFORMED)  URINALYSIS, ROUTINE W  REFLEX MICROSCOPIC    Imaging Review No results found.   EKG Interpretation None      MDM     Chest xray negative for acute problem. EKG neg for acute event. Reviewed by me. Urine drug screen pos for cocaine. Troponin negative for acute event. Remaining labs ok for medical screening. TTS called and Telepsych completed. Pt to be transferred to [place].   Final diagnoses:  None    *I have reviewed nursing notes, vital signs, and all appropriate lab and imaging results for this patient.**    Lily Kocher, PA-C 02/13/15 2009  Tanna Furry, MD 02/27/15 [phone]

## 2015-02-13 DIAGNOSIS — F332 Major depressive disorder, recurrent severe without psychotic features: Principal | ICD-10-CM

## 2015-02-13 DIAGNOSIS — F1410 Cocaine abuse, uncomplicated: Secondary | ICD-10-CM

## 2015-02-13 MED ORDER — SERTRALINE HCL 50 MG PO TABS
50.0000 mg | ORAL_TABLET | Freq: Every day | ORAL | Status: DC
  Administered 2015-02-13 – 2015-02-14 (×2): 50 mg via ORAL
  Filled 2015-02-13 (×4): qty 1
  Filled 2015-02-13: qty 14
  Filled 2015-02-13: qty 1
  Filled 2015-02-13: qty 14

## 2015-02-13 MED ORDER — SALINE SPRAY 0.65 % NA SOLN
2.0000 | NASAL | Status: DC | PRN
  Administered 2015-02-13 (×3): 2 via NASAL
  Filled 2015-02-13: qty 44

## 2015-02-13 MED ORDER — LORATADINE 10 MG PO TABS
10.0000 mg | ORAL_TABLET | Freq: Every day | ORAL | Status: DC
  Administered 2015-02-14: 10 mg via ORAL
  Filled 2015-02-13 (×4): qty 1

## 2015-02-13 MED ORDER — PSEUDOEPHEDRINE HCL 30 MG PO TABS
30.0000 mg | ORAL_TABLET | Freq: Three times a day (TID) | ORAL | Status: DC | PRN
  Administered 2015-02-13 (×2): 30 mg via ORAL
  Filled 2015-02-13 (×2): qty 1
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
## 2015-02-13 NOTE — H&P (Signed)
Psychiatric Admission Assessment Adult  Patient Identification: Becky [name] MRN:  [id number] Date of Evaluation:  02/13/2015 Chief Complaint:   " I got really overwhelmed " Principal Diagnosis: <principal problem not specified> Diagnosis:   Patient Active Problem List   Diagnosis Date Noted  . Depression [F32.9] 02/12/2015   History of Present Illness:: Patient is a 54 year old female. States she was feeling progressively depressed, related to severe psychosocial stressors, particularly relating to relationship issues. States that he had obtained a 50 B a week or two ago. She also states she received a series of unexpected bills, causing increased financial difficulties. Also, her father had a lower extremity amputation last week. States she had been doing relatively well and was " not that depressed" until recently, due to above stressors. States " on that day is like the whole world came down on me " " I had a terrible panic attack",  " I felt worse than I had ever felt before". She walked into a police station stating she was suicidal and making statements she wanted to be killed or would kill herself. She was also noted to be very anxious, panicky at the time. She was then brought to hospital. She had been experiencing some neuro-vegetative symptoms as below, but states that by now she is feeling better and denies any current SI.  Of note, patient's UDS is positive for Cocaine. States she does not abuse or use cocaine often, but recently had used impulsively due to her stressors as above. Elements:  Worsening depression and anxiety in the context of severe stressors, with recent emergence of suicidal ideations  Associated Signs/Symptoms: Depression Symptoms:  depressed mood, anhedonia, suicidal thoughts without plan, loss of energy/fatigue, decreased appetite, (Hypo) Manic Symptoms:   Denies  Anxiety Symptoms:  Recent panic attack, as above  Psychotic Symptoms:  Denies  PTSD  Symptoms: Denies  Total Time spent with patient: 45 minutes  Past Psychiatric History- no prior psychiatric admission. Has been followed at [facility] for depression, but not recently. No prior history of Panic or Agoraphobia, denies history of psychosis, mania, or PTSD.  Denies history of violence .  Remembers having been on Trazodone and some antidepressant - does not remember name,  But had not been taking any psychiatric medications in more than a year.  Past Medical History:  Does not smoke, NKDA.   Past Medical History  Diagnosis Date  . Hypertension   . Bronchitis    History reviewed. No pertinent past surgical history. Family History: Mother is deceased, died when patient was 30 from MI. Father alive, had leg  amputation last week . Has three sisters, one of whom is deceased from HIV.  (+) history of depression ( cousin) , (+) history of alcohol dependence in several members of extended family, no suicides in family. Social History:  Has three children, one in college, and two daughters 49, 25, who are staying with sister at this time. She is on Fish farm manager. Unemployed, but currently involved with [employer], denies legal issues, as noted, boyfriend obtained a 39 B recently, which she states was " because we were arguing". At this time denies any domestic violence.  History  Alcohol Use  . 3.0 oz/week  . 5 Cans of beer per week    Comment: occasional     History  Drug Use  . Yes  . Special: Cocaine    History   Social History  . Marital Status: Single    Spouse Name:  N/A  . Number of Children: N/A  . Years of Education: N/A   Social History Main Topics  . Smoking status: Former Smoker -- 0.00 packs/day for 30 years  . Smokeless tobacco: Never Used  . Alcohol Use: 3.0 oz/week    5 Cans of beer per week     Comment: occasional  . Drug Use: Yes    Special: Cocaine  . Sexual Activity: Not on file   Other Topics Concern  . None   Social History  Narrative   Additional Social History:  Musculoskeletal: Strength & Muscle Tone: within normal limits Gait & Station: normal Patient leans: N/A  Psychiatric Specialty Exam: Physical Exam  Review of Systems  Constitutional: Negative for fever, chills and weight loss.  HENT: Negative.        Nasal congestion  Respiratory: Negative for cough and shortness of breath.   Cardiovascular: Negative for chest pain and palpitations.  Gastrointestinal: Negative for vomiting and abdominal pain.  Genitourinary: Negative for dysuria, urgency and frequency.  Skin: Negative for rash.  Neurological: Negative for seizures.  Psychiatric/Behavioral: Positive for depression. The patient is nervous/anxious.     Blood pressure 113/89, pulse 91, temperature 98.1 F (36.7 C), temperature source Oral, resp. rate 18, height _0  (1.727 m), weight 170 lb (77.111 kg).Body mass index is 25.85 kg/(m^2).  General Appearance: Fairly Groomed  Engineer, water::  Fair  Speech:  Normal Rate  Volume:  Normal  Mood:  minimizes depression at this time, but presents sad, with a somewhat constricted affect  Affect:  Appropriate, mildly constricted   Thought Process:  Goal Directed and Linear  Orientation:  Other:  fully alert and attentive   Thought Content:  no hallucinations, no delusions, not internally preoccupied   Suicidal Thoughts:  No- at this time no thoughts of hurting self or anyone else   Homicidal Thoughts:  No  Memory:  recent and remote grossly intact  Judgement:  Fair  Insight:  Fair  Psychomotor Activity:  Normal  Concentration:  Good  Recall:  Good  Fund of Knowledge:Good  Language: Good  Akathisia:  Negative  Handed:  Left  AIMS (if indicated):     Assets:  Communication Skills Desire for Improvement Housing Resilience  ADL's:  Fair   Cognition: WNL  Sleep:  Number of Hours: 6.75   Risk to Self: Is patient at risk for suicide?: Yes Risk to Others:   Prior Inpatient Therapy:   Prior  Outpatient Therapy:    Alcohol Screening: 1. How often do you have a drink containing alcohol?: 4 or more times a week 2. How many drinks containing alcohol do you have on a typical day when you are drinking?: 5 or 6 3. How often do you have six or more drinks on one occasion?: Never Preliminary Score: 2 4. How often during the last year have you found that you were not able to stop drinking once you had started?: Weekly 5. How often during the last year have you failed to do what was normally expected from you becasue of drinking?: Weekly 6. How often during the last year have you needed a first drink in the morning to get yourself going after a heavy drinking session?: Never 7. How often during the last year have you had a feeling of guilt of remorse after drinking?: Weekly 8. How often during the last year have you been unable to remember what happened the night before because you had been drinking?: Weekly 9. Have you  or someone else been injured as a result of your drinking?: No 10. Has a relative or friend or a doctor or another health worker been concerned about your drinking or suggested you cut down?: No Alcohol Use Disorder Identification Test Final Score (AUDIT): 18 Brief Intervention: Patient declined brief intervention  Allergies:   NKDA  Lab Results:  Results for orders placed or performed during the hospital encounter of 02/12/15 (from the past 48 hour(s))  CBC with Differential     Status: Abnormal   Collection Time: 02/12/15  9:05 AM  Result Value Ref Range   WBC 4.5 4.0 - 10.5 K/uL   RBC 5.23 (H) 3.87 - 5.11 MIL/uL   Hemoglobin 15.0 12.0 - 15.0 g/dL   HCT 45.3 36.0 - 46.0 %   MCV 86.6 78.0 - 100.0 fL   MCH 28.7 26.0 - 34.0 pg   MCHC 33.1 30.0 - 36.0 g/dL   RDW 13.8 11.5 - 15.5 %   Platelets 185 150 - 400 K/uL   Neutrophils Relative % 47 43 - 77 %   Neutro Abs 2.1 1.7 - 7.7 K/uL   Lymphocytes Relative 44 12 - 46 %   Lymphs Abs 2.0 0.7 - 4.0 K/uL   Monocytes  Relative 7 3 - 12 %   Monocytes Absolute 0.3 0.1 - 1.0 K/uL   Eosinophils Relative 2 0 - 5 %   Eosinophils Absolute 0.1 0.0 - 0.7 K/uL   Basophils Relative 0 0 - 1 %   Basophils Absolute 0.0 0.0 - 0.1 K/uL  Troponin I     Status: None   Collection Time: 02/12/15  9:05 AM  Result Value Ref Range   Troponin I <0.03 <0.031 ng/mL    Comment:        NO INDICATION OF MYOCARDIAL INJURY.   Comprehensive metabolic panel     Status: Abnormal   Collection Time: 02/12/15  9:05 AM  Result Value Ref Range   Sodium 139 135 - 145 mmol/L   Potassium 3.9 3.5 - 5.1 mmol/L   Chloride 108 96 - 112 mmol/L   CO2 22 19 - 32 mmol/L   Glucose, Bld 101 (H) 70 - 99 mg/dL   BUN 16 6 - 23 mg/dL   Creatinine, Ser 0.88 0.50 - 1.10 mg/dL   Calcium 9.3 8.4 - 10.5 mg/dL   Total Protein 7.1 6.0 - 8.3 g/dL   Albumin 4.0 3.5 - 5.2 g/dL   AST 27 0 - 37 U/L   ALT 27 0 - 35 U/L   Alkaline Phosphatase 58 39 - 117 U/L   Total Bilirubin 0.7 0.3 - 1.2 mg/dL   GFR calc non Af Amer 74 (L) >90 mL/min   GFR calc Af Amer 86 (L) >90 mL/min    Comment: (NOTE) The eGFR has been calculated using the CKD EPI equation. This calculation has not been validated in all clinical situations. eGFR's persistently <90 mL/min signify possible Chronic Kidney Disease.    Anion gap 9 5 - 15  Acetaminophen level     Status: Abnormal   Collection Time: 02/12/15  9:05 AM  Result Value Ref Range   Acetaminophen (Tylenol), Serum <10.0 (L) 10 - 30 ug/mL    Comment:        THERAPEUTIC CONCENTRATIONS VARY SIGNIFICANTLY. A RANGE OF 10-30 ug/mL MAY BE AN EFFECTIVE CONCENTRATION FOR MANY PATIENTS. HOWEVER, SOME ARE BEST TREATED AT CONCENTRATIONS OUTSIDE THIS RANGE. ACETAMINOPHEN CONCENTRATIONS >150 ug/mL AT 4 HOURS AFTER INGESTION AND >50 ug/mL AT  12 HOURS AFTER INGESTION ARE OFTEN ASSOCIATED WITH TOXIC REACTIONS.   Salicylate level     Status: None   Collection Time: 02/12/15  9:05 AM  Result Value Ref Range   Salicylate Lvl <4.5  2.8 - 20.0 mg/dL  Ethanol     Status: None   Collection Time: 02/12/15  9:05 AM  Result Value Ref Range   Alcohol, Ethyl (B) <5 0 - 9 mg/dL    Comment:        LOWEST DETECTABLE LIMIT FOR SERUM ALCOHOL IS 11 mg/dL FOR MEDICAL PURPOSES ONLY   Urine rapid drug screen (hosp performed)     Status: Abnormal   Collection Time: 02/12/15 12:00 PM  Result Value Ref Range   Opiates NONE DETECTED NONE DETECTED   Cocaine POSITIVE (A) NONE DETECTED   Benzodiazepines NONE DETECTED NONE DETECTED   Amphetamines NONE DETECTED NONE DETECTED   Tetrahydrocannabinol NONE DETECTED NONE DETECTED   Barbiturates NONE DETECTED NONE DETECTED    Comment:        DRUG SCREEN FOR MEDICAL PURPOSES ONLY.  IF CONFIRMATION IS NEEDED FOR ANY PURPOSE, NOTIFY LAB WITHIN 5 DAYS.        LOWEST DETECTABLE LIMITS FOR URINE DRUG SCREEN Drug Class       Cutoff (ng/mL) Amphetamine      1000 Barbiturate      200 Benzodiazepine   409 Tricyclics       811 Opiates          300 Cocaine          300 THC              50   Urinalysis, Routine w reflex microscopic     Status: Abnormal   Collection Time: 02/12/15 12:00 PM  Result Value Ref Range   Color, Urine YELLOW YELLOW   APPearance HAZY (A) CLEAR   Specific Gravity, Urine 1.015 1.005 - 1.030   pH 6.5 5.0 - 8.0   Glucose, UA NEGATIVE NEGATIVE mg/dL   Hgb urine dipstick NEGATIVE NEGATIVE   Bilirubin Urine NEGATIVE NEGATIVE   Ketones, ur NEGATIVE NEGATIVE mg/dL   Protein, ur NEGATIVE NEGATIVE mg/dL   Urobilinogen, UA 0.2 0.0 - 1.0 mg/dL   Nitrite NEGATIVE NEGATIVE   Leukocytes, UA NEGATIVE NEGATIVE    Comment: MICROSCOPIC NOT DONE ON URINES WITH NEGATIVE PROTEIN, BLOOD, LEUKOCYTES, NITRITE, OR GLUCOSE <1000 mg/dL.   Current Medications: Current Facility-Administered Medications  Medication Dose Route Frequency Provider Last Rate Last Dose  . acetaminophen (TYLENOL) tablet 650 mg  650 mg Oral Q6H PRN Kerrie Buffalo, NP      . alum & mag hydroxide-simeth  (MAALOX/MYLANTA) 200-200-20 MG/5ML suspension 30 mL  30 mL Oral Q4H PRN Kerrie Buffalo, NP      . hydrOXYzine (ATARAX/VISTARIL) tablet 50 mg  50 mg Oral Q6H PRN Kerrie Buffalo, NP   50 mg at 02/12/15 1830  . magnesium hydroxide (MILK OF MAGNESIA) suspension 30 mL  30 mL Oral Daily PRN Kerrie Buffalo, NP      . nicotine (NICODERM CQ - dosed in mg/24 hours) patch 21 mg  21 mg Transdermal Q0600 Kerrie Buffalo, NP   21 mg at 02/13/15 0600  . pseudoephedrine (SUDAFED) tablet 30 mg  30 mg Oral Q8H PRN Niel Hummer, NP   30 mg at 02/13/15 0911  . sodium chloride (OCEAN) 0.65 % nasal spray 2 spray  2 spray Each Nare PRN Niel Hummer, NP   2 spray at 02/13/15 1429  .  traZODone (DESYREL) tablet 50 mg  50 mg Oral QHS PRN Kerrie Buffalo, NP   50 mg at 02/12/15 2136   PTA Medications: Prescriptions prior to admission  Medication Sig Dispense Refill Last Dose  . dexamethasone (DECADRON) 4 MG tablet 1 po bid with food (Patient not taking: Reported on 02/12/2015) 12 tablet 0 Past Month at Unknown time  . HYDROcodone-acetaminophen (NORCO/VICODIN) 5-325 MG per tablet Take 2 tablets by mouth every 4 (four) hours as needed. (Patient not taking: Reported on 02/12/2015) 10 tablet 0 11/08/2014 at Unknown time  . HYDROcodone-acetaminophen (NORCO/VICODIN) 5-325 MG per tablet Take 1-2 tablets by mouth every 6 (six) hours as needed. (Patient not taking: Reported on 02/12/2015) 12 tablet 0   . ibuprofen (ADVIL,MOTRIN) 200 MG tablet Take 400 mg by mouth every 6 (six) hours as needed for moderate pain.   Past Month at Unknown time  . levofloxacin (LEVAQUIN) 500 MG tablet Take 1 tablet (500 mg total) by mouth daily. (Patient not taking: Reported on 02/12/2015) 7 tablet 0 11/08/2014 at Unknown time  . oxyCODONE-acetaminophen (PERCOCET) 5-325 MG per tablet Take 2 tablets by mouth every 4 (four) hours as needed. (Patient not taking: Reported on 02/12/2015) 15 tablet 0   . oxyCODONE-acetaminophen (PERCOCET) 5-325 MG per tablet Take 2  tablets by mouth every 4 (four) hours as needed. (Patient not taking: Reported on 11/07/2014) 6 tablet 0   . penicillin v potassium (VEETID) 500 MG tablet Take 1 tablet (500 mg total) by mouth 4 (four) times daily. (Patient not taking: Reported on 02/12/2015) 40 tablet 0 Completed Course at Unknown time  . predniSONE (DELTASONE) 10 MG tablet 5,4,3,2,1 - take with food (Patient not taking: Reported on 02/12/2015) 15 tablet 0     Previous Psychotropic Medications:as above had been on Trazodone in the past ( last year ) and briefly took an antidepressant but does not remember name. No psychiatric medications in " a long time".   Substance Abuse History in the last 12 months:  Yes.   denies alcohol abuse, remote history of cocaine dependence, but states she had stopped . Recently had used cocaine related to increased stressors, but states she had not used in a long time.    Consequences of Substance Abuse: denies   Results for orders placed or performed during the hospital encounter of 02/12/15 (from the past 72 hour(s))  CBC with Differential     Status: Abnormal   Collection Time: 02/12/15  9:05 AM  Result Value Ref Range   WBC 4.5 4.0 - 10.5 K/uL   RBC 5.23 (H) 3.87 - 5.11 MIL/uL   Hemoglobin 15.0 12.0 - 15.0 g/dL   HCT 45.3 36.0 - 46.0 %   MCV 86.6 78.0 - 100.0 fL   MCH 28.7 26.0 - 34.0 pg   MCHC 33.1 30.0 - 36.0 g/dL   RDW 13.8 11.5 - 15.5 %   Platelets 185 150 - 400 K/uL   Neutrophils Relative % 47 43 - 77 %   Neutro Abs 2.1 1.7 - 7.7 K/uL   Lymphocytes Relative 44 12 - 46 %   Lymphs Abs 2.0 0.7 - 4.0 K/uL   Monocytes Relative 7 3 - 12 %   Monocytes Absolute 0.3 0.1 - 1.0 K/uL   Eosinophils Relative 2 0 - 5 %   Eosinophils Absolute 0.1 0.0 - 0.7 K/uL   Basophils Relative 0 0 - 1 %   Basophils Absolute 0.0 0.0 - 0.1 K/uL  Troponin I     Status:  None   Collection Time: 02/12/15  9:05 AM  Result Value Ref Range   Troponin I <0.03 <0.031 ng/mL    Comment:        NO INDICATION  OF MYOCARDIAL INJURY.   Comprehensive metabolic panel     Status: Abnormal   Collection Time: 02/12/15  9:05 AM  Result Value Ref Range   Sodium 139 135 - 145 mmol/L   Potassium 3.9 3.5 - 5.1 mmol/L   Chloride 108 96 - 112 mmol/L   CO2 22 19 - 32 mmol/L   Glucose, Bld 101 (H) 70 - 99 mg/dL   BUN 16 6 - 23 mg/dL   Creatinine, Ser 0.88 0.50 - 1.10 mg/dL   Calcium 9.3 8.4 - 10.5 mg/dL   Total Protein 7.1 6.0 - 8.3 g/dL   Albumin 4.0 3.5 - 5.2 g/dL   AST 27 0 - 37 U/L   ALT 27 0 - 35 U/L   Alkaline Phosphatase 58 39 - 117 U/L   Total Bilirubin 0.7 0.3 - 1.2 mg/dL   GFR calc non Af Amer 74 (L) >90 mL/min   GFR calc Af Amer 86 (L) >90 mL/min    Comment: (NOTE) The eGFR has been calculated using the CKD EPI equation. This calculation has not been validated in all clinical situations. eGFR's persistently <90 mL/min signify possible Chronic Kidney Disease.    Anion gap 9 5 - 15  Acetaminophen level     Status: Abnormal   Collection Time: 02/12/15  9:05 AM  Result Value Ref Range   Acetaminophen (Tylenol), Serum <10.0 (L) 10 - 30 ug/mL    Comment:        THERAPEUTIC CONCENTRATIONS VARY SIGNIFICANTLY. A RANGE OF 10-30 ug/mL MAY BE AN EFFECTIVE CONCENTRATION FOR MANY PATIENTS. HOWEVER, SOME ARE BEST TREATED AT CONCENTRATIONS OUTSIDE THIS RANGE. ACETAMINOPHEN CONCENTRATIONS >150 ug/mL AT 4 HOURS AFTER INGESTION AND >50 ug/mL AT 12 HOURS AFTER INGESTION ARE OFTEN ASSOCIATED WITH TOXIC REACTIONS.   Salicylate level     Status: None   Collection Time: 02/12/15  9:05 AM  Result Value Ref Range   Salicylate Lvl <6.7 2.8 - 20.0 mg/dL  Ethanol     Status: None   Collection Time: 02/12/15  9:05 AM  Result Value Ref Range   Alcohol, Ethyl (B) <5 0 - 9 mg/dL    Comment:        LOWEST DETECTABLE LIMIT FOR SERUM ALCOHOL IS 11 mg/dL FOR MEDICAL PURPOSES ONLY   Urine rapid drug screen (hosp performed)     Status: Abnormal   Collection Time: 02/12/15 12:00 PM  Result Value Ref  Range   Opiates NONE DETECTED NONE DETECTED   Cocaine POSITIVE (A) NONE DETECTED   Benzodiazepines NONE DETECTED NONE DETECTED   Amphetamines NONE DETECTED NONE DETECTED   Tetrahydrocannabinol NONE DETECTED NONE DETECTED   Barbiturates NONE DETECTED NONE DETECTED    Comment:        DRUG SCREEN FOR MEDICAL PURPOSES ONLY.  IF CONFIRMATION IS NEEDED FOR ANY PURPOSE, NOTIFY LAB WITHIN 5 DAYS.        LOWEST DETECTABLE LIMITS FOR URINE DRUG SCREEN Drug Class       Cutoff (ng/mL) Amphetamine      1000 Barbiturate      200 Benzodiazepine   544 Tricyclics       920 Opiates          300 Cocaine          300 THC  50   Urinalysis, Routine w reflex microscopic     Status: Abnormal   Collection Time: 02/12/15 12:00 PM  Result Value Ref Range   Color, Urine YELLOW YELLOW   APPearance HAZY (A) CLEAR   Specific Gravity, Urine 1.015 1.005 - 1.030   pH 6.5 5.0 - 8.0   Glucose, UA NEGATIVE NEGATIVE mg/dL   Hgb urine dipstick NEGATIVE NEGATIVE   Bilirubin Urine NEGATIVE NEGATIVE   Ketones, ur NEGATIVE NEGATIVE mg/dL   Protein, ur NEGATIVE NEGATIVE mg/dL   Urobilinogen, UA 0.2 0.0 - 1.0 mg/dL   Nitrite NEGATIVE NEGATIVE   Leukocytes, UA NEGATIVE NEGATIVE    Comment: MICROSCOPIC NOT DONE ON URINES WITH NEGATIVE PROTEIN, BLOOD, LEUKOCYTES, NITRITE, OR GLUCOSE <1000 mg/dL.    Observation Level/Precautions:  15 minute checks  Laboratory:  If needed   Psychotherapy:  Milieu, group therapy  Medications:  Trazodone PRNs for insomnia, agrees to antidepressant trial, start Zoloft 50 mgrs QDAY .  Has been started on Sudafed due to severe nasal congestion, which she states is related to seasonal , environment allergies.   Consultations:  As needed   Discharge Concerns:  Limited support network  Estimated LOS: 5 days   Other:     Psychological Evaluations: No   Treatment Plan Summary: Daily contact with patient to assess and evaluate symptoms and progress in treatment, Medication  management, Plan continue inpatient treatment  and medications as above   Medical Decision Making:  Review of Psycho-Social Stressors (1), Review or order clinical lab tests (1), Established Problem, Worsening (2), Review of Medication Regimen & Side Effects (2) and Review of New Medication or Change in Dosage (2)  I certify that inpatient services furnished can reasonably be expected to improve the patient's condition.   Neita Garnet 3/25/20163:44 PM

## 2015-02-13 NOTE — Progress Notes (Signed)
D: Patient denies SI/HI and A/V hallucinations; patient reports sleep is good; reports appetite is good; reports energy level is normal ; reports ability to concentrate is good; rates depression as 3/10; rates hopelessness 0/10; rates anxiety as 0/10; patient reports " I just needed some time away, my bills, my boyfriend took out a 58 B on me, and my dog chased the police so I got to deal with that and it was just a lot but I feel better now. I know as long as God is on my side."; patient denies any withdrawal symptoms and patient denies any seizures due to withdrawal  A: Monitored q 15 minutes; patient encouraged to attend groups; patient educated about medications; patient given medications per physician orders; patient encouraged to express feelings and/or concerns  R: Patient was on the phone and started to escalate and was shouting at the person on the phone, patient reports that it was her sister and the sister was telling her about social services coming to see her; patient states " I dont care about social services because everyone can have a bad day, even people in social services"; patient was able to calm down and lay down in the bed; patient's interaction with staff and peers is appropriate; patient was able to set goal to talk with staff 1:1 when having feelings of SI;

## 2015-02-13 NOTE — BHH Group Notes (Signed)
   [facility] LCSW Aftercare Discharge Planning Group Note  02/13/2015  8:45 AM   Participation Quality: Alert, Appropriate and Oriented  Mood/Affect: Depressed and Flat  Depression Rating: 1  Anxiety Rating: 1  Thoughts of Suicide: Pt denies SI/HI  Will you contract for safety? Yes  Current AVH: Pt denies  Plan for Discharge/Comments: Pt attended discharge planning group and actively participated in group. CSW provided pt with today's workbook. Patient reports feeling "better" today and expressed that she wants to discharge. Patient rated symptoms low but appeared flat, depressed, and tearful. Patient reports that she can return home and would like a referral to [facility]: Pt reports access to transportation  Supports: No supports mentioned at this time  Tilden Fossa, MSW, [place] Social Worker [facility] [phone]

## 2015-02-13 NOTE — BHH Group Notes (Signed)
[facility] LCSW Group Therapy 02/13/2015 1:15 PM Type of Therapy: Group Therapy Participation Level: Minimal  Participation Quality: Attentive   Affect: Depressed and Flat  Cognitive: Alert and Oriented  Insight: Developing/Improving and Engaged  Engagement in Therapy: Developing/Improving and Engaged  Modes of Intervention: Clarification, Confrontation, Discussion, Education, Exploration, Limit-setting, Orientation, Problem-solving, Rapport Building, Art therapist, Socialization and Support  Summary of Progress/Problems: The topic for today was feelings about relapse. Pt discussed what relapse prevention is to them and identified triggers that they are on the path to relapse. Pt processed their feeling towards relapse and was able to relate to peers. Pt discussed coping skills that can be used for relapse prevention. Patient actively listened during group but did not participate despite encouragement.    Tilden Fossa, MSW, [place] Worker [facility] [phone]
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
## 2015-02-13 NOTE — Progress Notes (Signed)
Pt was under the belief that MD would order Afrin for her. She became irate at med window when told she did not have it ordered. Attempted to discuss effects of regular Afrin use, but pt said she "wasn't interested" in hearing it. Sudafed given.

## 2015-02-13 NOTE — Progress Notes (Signed)
Recreation Therapy Notes   Date: 03.25.2016 Time: 9:30am Location: 300 Hall Group Room   Group Topic: Stress Management  Goal Area(s) Addresses:  Patient will actively participate in stress management techniques presented during session.   Behavioral Response: Did not attend.    Laureen Ochs Hollyann Pablo, LRT/CTRS  Lane Hacker 02/13/2015 1:57 PM

## 2015-02-13 NOTE — BHH Suicide Risk Assessment (Signed)
[facility] INPATIENT:  Family/Significant Other Suicide Prevention Education  Suicide Prevention Education:  Patient Refusal for Family/Significant Other Suicide Prevention Education: The patient Becky [name] has refused to provide written consent for family/significant other to be provided Family/Significant Other Suicide Prevention Education during admission and/or prior to discharge.  Physician notified. SPE reviewed with patient and brochure provided. Patient encouraged to return to hospital if having suicidal thoughts, patient verbalized his/her understanding and has no further questions at this time.   Kierrah Kilbride, Casimiro Needle 02/13/2015, 5:26 PM

## 2015-02-13 NOTE — Tx Team (Signed)
Interdisciplinary Treatment Plan Update (Adult) Date: 02/13/2015   Time Reviewed: 9:30 AM  Progress in Treatment: Attending groups: Yes Participating in groups: Yes Taking medication as prescribed: Yes Tolerating medication: Yes Family/Significant other contact made: No, patient has declined for CSW to make collateral contact Patient understands diagnosis: Yes Discussing patient identified problems/goals with staff: Yes Medical problems stabilized or resolved: Yes Denies suicidal/homicidal ideation: Yes Issues/concerns per patient self-inventory: Yes Other:  New problem(s) identified: N/A  Discharge Plan or Barriers:  3/25: Patient plans to return home to follow up with [facility]. Patient denies SI/HI today.  Reason for Continuation of Hospitalization:  Depression Anxiety Medication Stabilization   Comments: N/A  Estimated length of stay: Discharge anticipated for Sunday 3/27  For review of initial/current patient goals, please see plan of care. Patient is a 54 year old African American female admitted with SI, HI, depression. Patient lives in [place] and plans to return home to follow up with outpatient services. Patient will benefit from crisis stabilization, medication evaluation, group therapy, and psycho education in addition to case management for discharge planning. Patient and CSW reviewed pt's identified goals and treatment plan. Pt verbalized understanding and agreed to treatment plan.    Attendees: Patient:    Family:    Physician: Dr. Parke Poisson 02/13/2015 9:30 AM  Nursing: Phillis Knack; Kerby Nora , RN 02/13/2015 9:30 AM  Clinical Social Worker: Erasmo Downer Anari Evitt, [place] 02/13/2015 9:30 AM  Other:  02/13/2015 9:30 AM  Other:  02/13/2015 9:30 AM  Other:  02/13/2015 9:30 AM  Other: Agustina Caroli Elmarie Shiley; Nena Polio NP 02/13/2015 9:30 AM  Other:                  Scribe for Treatment  Team:  Tilden Fossa, MSW, [place]

## 2015-02-14 DIAGNOSIS — F332 Major depressive disorder, recurrent severe without psychotic features: Secondary | ICD-10-CM | POA: Insufficient documentation

## 2015-02-14 DIAGNOSIS — F1410 Cocaine abuse, uncomplicated: Secondary | ICD-10-CM | POA: Insufficient documentation

## 2015-02-14 MED ORDER — TRAZODONE HCL 50 MG PO TABS
50.0000 mg | ORAL_TABLET | Freq: Every evening | ORAL | Status: DC | PRN
Start: 2015-02-14 — End: 2016-01-19

## 2015-02-14 MED ORDER — SERTRALINE HCL 50 MG PO TABS
50.0000 mg | ORAL_TABLET | Freq: Every day | ORAL | Status: DC
Start: 2015-02-14 — End: 2016-01-19

## 2015-02-14 NOTE — Progress Notes (Signed)
.  Psychoeducational Group Note    Date: 02/14/2015 Time:  1000  Goal Setting Purpose of Group: To be able to set a goal that is measurable and that can be accomplished in one day Participation Level:  Active  Participation Quality:  Attentive  Affect:  Appropriate  Cognitive:  Appropriate  Insight:  Improving  Engagement in Group:  Engaged  Additional Comments:  Pt attending the group, participating and providing feedback   Becky [name]

## 2015-02-14 NOTE — Progress Notes (Signed)
  [facility] Adult Case Management Discharge Plan :  Will you be returning to the same living situation after discharge:  Yes,  home At discharge, do you have transportation home?: Yes,  ride coming to pick up Do you have the ability to pay for your medications: Yes,  income limited   Release of information consent forms completed and in the chart;  Patient's signature needed at discharge.  Patient to Follow up at: Follow-up Information    Follow up with [facility].   Why:  Walk-in clinic for assessment for therapy and medication management services Wed. March 30th between 7:45 to 10:30. Please bring your ID and insurance card if you have one.   Contact information:   [address],  [place], [place] [address] [phone]  Fax: [phone]       Patient denies SI/HI: Yes,  see doctor note    Safety Planning and Suicide Prevention discussed: Yes,  with pt, refused with family  Have you used any form of tobacco in the last 30 days? (Cigarettes, Smokeless Tobacco, Cigars, and/or Pipes): No  Has patient been referred to the Quitline?: Patient refused referral  Lysle Dingwall 02/14/2015, 5:28 PM

## 2015-02-14 NOTE — Progress Notes (Signed)
Psychoeducational Group Note  Date: 02/14/2015 Time:  1015  Group Topic/Focus:  Identifying Needs:   The focus of this group is to help patients identify their personal needs that have been historically problematic and identify healthy behaviors to address their needs.  Participation Level:  Active  Participation Quality:  Appropriate  Affect:  Appropriate  Cognitive:  Oriented  Insight:  Improving  Engagement in Group:  Engaged  Additional Comments:  Pt was engaged in the group and partisipated  Paulino Rily

## 2015-02-14 NOTE — Progress Notes (Signed)
Writer spoke with patient at medication window where she requested her afrin nasal spray. Writer informed her that NP would be called to receive an order for her nasal conjestion. Claritin was ordered for tomorrow. She was encouraged to use her sodium chloride nasal spray which she did. Patient denies si/hi/ a/v hallucinations, support and encouragement given.Safety maintained on unit with 15 min checks.

## 2015-02-14 NOTE — Progress Notes (Signed)
Patient ID: Becky [name], female   DOB: February 22, 1961, 54 y.o.   MRN: [id number] Patient discharged per physician order; patient denies SI/HI and A/V hallucinations; patient received prescriptions and copy of AVS after it was reviewed; patient had no other questions or concerns at this time; patient to receive samples from pharmacist Ailene Ravel) in the lobby; patient verbalized and signed that she received all belongings; patient left the unit ambulatory; assigned RN Dorena Bodo RN to close out care plans and patient education

## 2015-02-14 NOTE — BHH Group Notes (Signed)
[facility] Group Notes: (Clinical Social Work)   02/14/2015      Type of Therapy:  Group Therapy   Participation Level:  Did Not Attend despite MHT prompting   Selmer Dominion, LCSW 02/14/2015, 4:41 PM
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
## 2015-02-14 NOTE — Progress Notes (Signed)
Becky [name] is ready to be discharged first thing this morning and she says so upon meeting her at med pass . She makes good eye contact. She laughs, talks about " feeling better" and being " ready to go home".   A She is compliant with her meds, is attending her groups and  Speaks of " hetting back to life" when she goes home. She completes her morning daily assessment and on it she writes she denies SI today and she rates her depression, hopelessness and anxiety " 0/0/0", respectively.   R Safety in place. POC includes DC impending.

## 2015-02-15 NOTE — Discharge Summary (Signed)
Physician Discharge Summary Note  Patient:  Becky [name] is an 54 y.o., female MRN:  [id number] DOB:  1961/10/02 Patient phone:  [phone] (home)  Patient address:   [address] [place] [address],  Total Time spent with patient: 30 minutes  Date of Admission:  02/12/2015 Date of Discharge: 02/14/2015  Reason for Admission:  Depression  Principal Problem: MDD (major depressive disorder), recurrent episode, severe Discharge Diagnoses: Patient Active Problem List   Diagnosis Date Noted  . Major depressive disorder, recurrent, severe without psychotic features [F33.2]   . Cocaine abuse [F14.10]   . MDD (major depressive disorder), recurrent episode, severe [F33.2] 02/13/2015  . Depression [F32.9] 02/12/2015    Musculoskeletal: Strength & Muscle Tone: within normal limits Gait & Station: normal Patient leans: N/A  Psychiatric Specialty Exam:  SEE SRA Physical Exam  Vitals reviewed.   Review of Systems  All other systems reviewed and are negative.   Blood pressure 127/101, pulse 73, temperature 97.1 F (36.2 C), temperature source Oral, resp. rate 20, height 5\' 8"  (1.727 m), weight 77.111 kg (170 lb).Body mass index is 25.85 kg/(m^2).   Past Medical History:  Past Medical History  Diagnosis Date  . Hypertension   . Bronchitis    History reviewed. No pertinent past surgical history. Family History: History reviewed. No pertinent family history. Social History:  History  Alcohol Use  . 3.0 oz/week  . 5 Cans of beer per week    Comment: occasional     History  Drug Use  . Yes  . Special: Cocaine    History   Social History  . Marital Status: Single    Spouse Name: N/A  . Number of Children: N/A  . Years of Education: N/A   Social History Main Topics  . Smoking status: Former Smoker -- 0.00 packs/day for 30 years  . Smokeless tobacco: Never Used  . Alcohol Use: 3.0 oz/week    5 Cans of beer per week     Comment: occasional  . Drug Use: Yes   Special: Cocaine  . Sexual Activity: Not on file   Other Topics Concern  . None   Social History Narrative   Risk to Self: Is patient at risk for suicide?: Yes What has been your use of drugs/alcohol within the last 12 months?: Reports drinking alcohol daily, marijuana occasionally.  Risk to Others:   Prior Inpatient Therapy:   Prior Outpatient Therapy:    Level of Care:  OP  Hospital Course:  Patient is a 54 year old female. States she was feeling progressively depressed, related to severe psychosocial stressors, particularly relating to relationship issues. States that he had obtained a 50 B a week or two ago. She also states she received a series of unexpected bills, causing increased financial difficulties. Also, her father had a lower extremity amputation last week. States she had been doing relatively well and was " not that depressed" until recently, due to above stressors. States " on that day is like the whole world came down on me " " I had a terrible panic attack", " I felt worse than I had ever felt before". She walked into a police station stating she was suicidal and making statements she wanted to be killed or would kill herself. She was also noted to be very anxious, panicky at the time. She was then brought to hospital.  Becky [name] was admitted for MDD (major depressive disorder), recurrent episode, severe and crisis management.  She  was treated discharged with the medications listed below under Medication List.  Medical problems were identified and treated as needed.  Home medications were restarted as appropriate.  Improvement was monitored by observation and Becky [name] daily report of symptom reduction.  Emotional and mental status was monitored by daily self-inventory reports completed by Becky [name] and clinical staff.         Becky [name] was evaluated by the treatment team for stability and plans for continued recovery upon discharge.  Becky [name]  motivation was an integral factor for scheduling further treatment.  Employment, transportation, bed availability, health status, family support, and any pending legal issues were also considered during her hospital stay.  She was offered further treatment options upon discharge including but not limited to Residential, Intensive Outpatient, and Outpatient treatment.  Becky [name] will follow up with the services as listed below under Follow Up Information.     Upon completion of this admission the patient was both mentally and medically stable for discharge denying suicidal/homicidal ideation, auditory/visual/tactile hallucinations, delusional thoughts and paranoia.      Consults:  psychiatry  Significant Diagnostic Studies:  labs: per ED  Discharge Vitals:   Blood pressure 127/101, pulse 73, temperature 97.1 F (36.2 C), temperature source Oral, resp. rate 20, height 5\' 8"  (1.727 m), weight 77.111 kg (170 lb). Body mass index is 25.85 kg/(m^2). Lab Results:   No results [name] for this or any previous visit (from the past 72 hour(s)).  Physical Findings: AIMS:  , ,  ,  ,    CIWA:    COWS:      See Psychiatric Specialty Exam and Suicide Risk Assessment completed by Attending Physician prior to discharge.  Discharge destination:  Home  Is patient on multiple antipsychotic therapies at discharge:  No   Has Patient had three or more failed trials of antipsychotic monotherapy by history:  No    Recommended Plan for Multiple Antipsychotic Therapies: NA  Discharge Instructions    Diet - low sodium heart healthy    Complete by:  As directed      Discharge instructions    Complete by:  As directed   Take all of your medications as directed. Be sure to keep all of your follow up appointments.  If you are unable to keep your follow up appointment, call your Doctor's office to let them know, and reschedule.  Make sure that you have enough medication to last until your appointment. Be  sure to get plenty of rest. Going to bed at the same time each night will help. Try to avoid sleeping during the day.  Increase your activity as tolerated. Regular exercise will help you to sleep better and improve your mental health. Eating a heart healthy diet is recommended. Try to avoid salty or fried foods. Be sure to avoid all alcohol and illegal drugs.     Increase activity slowly    Complete by:  As directed             Medication List    STOP taking these medications        dexamethasone 4 MG tablet  Commonly known as:  DECADRON     HYDROcodone-acetaminophen 5-325 MG per tablet  Commonly known as:  NORCO/VICODIN     ibuprofen 200 MG tablet  Commonly known as:  ADVIL,MOTRIN     levofloxacin 500 MG tablet  Commonly known as:  LEVAQUIN     oxyCODONE-acetaminophen 5-325 MG per  tablet  Commonly known as:  PERCOCET     penicillin v potassium 500 MG tablet  Commonly known as:  VEETID     predniSONE 10 MG tablet  Commonly known as:  DELTASONE      TAKE these medications      Indication   sertraline 50 MG tablet  Commonly known as:  ZOLOFT  Take 1 tablet (50 mg total) by mouth daily.   Indication:  Major Depressive Disorder     traZODone 50 MG tablet  Commonly known as:  DESYREL  Take 1 tablet (50 mg total) by mouth at bedtime as needed for sleep.   Indication:  Trouble Sleeping           Follow-up Information    Follow up with [facility].   Why:  Walk-in clinic for assessment for therapy and medication management services Wed. March 30th between 7:45 to 10:30. Please bring your ID and insurance card if you have one.   Contact information:   [address] [place],  [place], [place] [address] [phone]  Fax: [phone]       Follow-up recommendations:  Activity:  as tol, diet as tol  Comments:  1.  Take all your medications as prescribed.              2.  Report any adverse side effects to outpatient provider.                       3.  Patient instructed  to not use alcohol or illegal drugs while on prescription medicines.            4.  In the event of worsening symptoms, instructed patient to call 911, the crisis hotline or go to nearest emergency room for evaluation of symptoms.  Total Discharge Time: 30 min  Signed: Freda Munro May Agustin AGNP-BC 02/15/2015, 4:01 PM  Patient seen, Suicide Assessment Completed.  Disposition Plan Reviewed

## 2015-02-18 NOTE — Progress Notes (Signed)
Patient Discharge Instructions:  After Visit Summary (AVS):   Faxed to:  02/18/15 Discharge Summary Note:   Faxed to:  02/18/15 Psychiatric Admission Assessment Note:   Faxed to:  02/18/15 Suicide Risk Assessment - Discharge Assessment:   Faxed to:  02/18/15 Faxed/Sent to the Next Level Care provider:  02/18/15 Faxed to [facility], 02/18/2015, 2:02 PM

## 2015-02-25 ENCOUNTER — Encounter (HOSPITAL_COMMUNITY): Payer: Self-pay | Admitting: Emergency Medicine

## 2015-02-25 ENCOUNTER — Emergency Department (HOSPITAL_COMMUNITY)
Admission: EM | Admit: 2015-02-25 | Discharge: 2015-02-25 | Disposition: A | Discharge status: Home / Self Care | Payer: Medicaid Other | Attending: Emergency Medicine | Admitting: Emergency Medicine

## 2015-02-25 DIAGNOSIS — I10 Essential (primary) hypertension: Secondary | ICD-10-CM | POA: Insufficient documentation

## 2015-02-25 DIAGNOSIS — Z79899 Other long term (current) drug therapy: Secondary | ICD-10-CM | POA: Insufficient documentation

## 2015-02-25 DIAGNOSIS — J3489 Other specified disorders of nose and nasal sinuses: Secondary | ICD-10-CM | POA: Diagnosis not present

## 2015-02-25 DIAGNOSIS — Z87891 Personal history of nicotine dependence: Secondary | ICD-10-CM | POA: Insufficient documentation

## 2015-02-25 DIAGNOSIS — R51 Headache: Secondary | ICD-10-CM | POA: Diagnosis present

## 2015-02-25 DIAGNOSIS — R519 Headache, unspecified: Secondary | ICD-10-CM

## 2015-02-25 DIAGNOSIS — R0981 Nasal congestion: Secondary | ICD-10-CM

## 2015-02-25 HISTORY — DX: Headache, unspecified: R51.9

## 2015-02-25 HISTORY — DX: Headache: R51

## 2015-02-25 LAB — I-STAT CHEM 8, ED
BUN: 13 mg/dL (ref 6–23)
CALCIUM ION: 1.22 mmol/L (ref 1.12–1.23)
CREATININE: 0.8 mg/dL (ref 0.50–1.10)
Chloride: 107 mmol/L (ref 96–112)
Glucose, Bld: 104 mg/dL — ABNORMAL HIGH (ref 70–99)
HCT: 52 % — ABNORMAL HIGH (ref 36.0–46.0)
HEMOGLOBIN: 17.7 g/dL — AB (ref 12.0–15.0)
Potassium: 4.1 mmol/L (ref 3.5–5.1)
Sodium: 141 mmol/L (ref 135–145)
TCO2: 21 mmol/L (ref 0–100)

## 2015-02-25 MED ORDER — IBUPROFEN 400 MG PO TABS: 400.0000 mg | ORAL_TABLET | Freq: Once | ORAL | Status: DC

## 2015-02-25 MED ORDER — ACETAMINOPHEN 500 MG PO TABS: 1000.0000 mg | ORAL_TABLET | Freq: Once | ORAL | Status: DC

## 2015-02-25 NOTE — ED Notes (Signed)
MD at bedside. 

## 2015-02-25 NOTE — Discharge Instructions (Signed)
°Emergency Department Resource Guide °1) Find a Doctor and Pay Out of Pocket °Although you won't have to find out who is covered by your insurance plan, it is a good idea to ask around and get recommendations. You will then need to call the office and see if the doctor you have chosen will accept you as a new patient and what types of options they offer for patients who are self-pay. Some doctors offer discounts or will set up payment plans for their patients who do not have insurance, but you will need to ask so you aren't surprised when you get to your appointment. ° °2) Contact Your Local Health Department °Not all health departments have doctors that can see patients for sick visits, but many do, so it is worth a call to see if yours does. If you don't know where your local health department is, you can check in your phone book. The CDC also has a tool to help you locate your state's health department, and many state websites also have listings of all of their local health departments. ° °3) Find a Walk-in Clinic °If your illness is not likely to be very severe or complicated, you may want to try a walk in clinic. These are popping up all over the country in pharmacies, drugstores, and shopping centers. They're usually staffed by nurse practitioners or physician assistants that have been trained to treat common illnesses and complaints. They're usually fairly quick and inexpensive. However, if you have serious medical issues or chronic medical problems, these are probably not your best option. ° °No Primary Care Doctor: °- Call [facility] Connect at  832-8000 - they can help you locate a primary care doctor that  accepts your insurance, provides certain services, etc. °- Physician Referral Service- 1-800-533-3463 ° °Chronic Pain Problems: °Organization         Address  Phone   Notes  ° [facility]  (336) 297-2271 Patients need to be referred by their primary care doctor.  ° °Medication  Assistance: °Organization         Address  Phone   Notes  °[place] [facility] [address] °[place], [place] [address] (336) 641-8030 --Must be a resident of [place] °-- Must have NO insurance coverage whatsoever (no Medicaid/ Medicare, etc.) °-- The pt. MUST have a primary care doctor that directs their care regularly and follows them in the community °  °[facility]  (866) 331-1348   °[employer]  (888) 892-1162   ° °Agencies that provide inexpensive medical care: °Organization         Address  Phone   Notes  °[facility]  (336) 832-8035   °[facility]    (336) 832-7272   °[facility] [address] °[place], [place] [address] (336) 832-4777   °[facility] [address], °[place] (336) 271-4999   °[facility]    (336) 373-0678   °[facility]    (336) 272-1050   °[facility] ° [address], [place] Phone:  (336) 832-4444, Fax:  (336) 832-4440 Hours of Operation:  9 am - 6 pm, M-F.  Also accepts Medicaid/Medicare and self-pay.  °[facility] ° [address], [place] Phone: (336) 832-3150, Fax: (336) [phone]. Hours of Operation:  8:30 am - 5:30 pm, M-F.  Also accepts Medicaid and self-pay.  °[facility] [place] [address], [place] Phone: (336) 878-6027   °[facility] [address], [place], [place] (336)723-1848, Ext. 123 Mondays & Thursdays: 7-9 AM.  First 15 patients are seen on a first come, first serve basis. °  ° °Medicaid-accepting [place] Providers: ° °Organization         Address  Phone   Notes  °[facility] [address], [place] (336) 641-2100 Also accepts self-pay patients.  °[facility] [address], [place] ° (336) 856-9996   °[facility] [address], [place]  (336) 288-8857   °[facility] [address] [place] [address], [place] (336) 299-7000   °Veita Bland [address], [place]  ° (336) 373-1557 Only accepts [place] Access Medicaid patients after they have their name applied to their card.  ° °Self-Pay (no insurance) in [place]: ° °Organization         Address  Phone   Notes  °Sickle Cell Patients, [facility] [address], [place] (336) 832-1970   °[facility] [address], [place] (336) 832-4400   °[facility] [place] ° [address], [place] (336) 992-4800   °[facility]/Dr. Osei-Bonsu ° [address] [place] [address], [place] or [address], [place] (336) 841-8500 Phone number for both [place] and [place] locations is the same.  °[facility] [address], [place] (336) 299-0000   °[facility] [address] [place] [address], [place] or [address] (336) 852-7530 °(336) 878-2260   °[facility] [address], [place] (336) 350-1642, phone; (336) 294-5005, fax Sees patients 1st and 3rd Saturday of every month.  Must not qualify for public or private insurance (i.e. Medicaid, Medicare, [place] Health Choice, Veterans' Benefits) • Household income should be no more than 200% of the poverty level •The clinic cannot treat you if you are pregnant or think you are pregnant • Sexually transmitted diseases are not treated at the clinic.  ° ° °Dental Care: °Organization         Address  Phone  Notes  °[place] [facility] [address], [place] (336) 641-6152 Accepts children up to age 21 who are enrolled in Medicaid or [place] Health Choice; pregnant women with a Medicaid card; and children who have applied for Medicaid or [place] Health Choice, but were declined, whose parents can pay a reduced fee at time of service.  °[place]  [facility] [place]  [address], [place] (336) 641-7733 Accepts children up to age 21 who are enrolled in Medicaid or [place] Health Choice; pregnant women with a Medicaid card; and children who have applied for Medicaid or [place] Health Choice, but were declined, whose parents can pay a reduced fee at time of service.  °[facility] ° [address], [place] (336) 641-4533 Patients are seen by appointment only. Walk-ins are not accepted. [facility] will see patients 18 years of age and older. °Monday - Tuesday (8am-5pm) °Most Wednesdays (8:30-5pm) °$30 per visit, cash only  °[facility] ° [address], [place] (336) 641-4533 Patients are seen by appointment only. Walk-ins are not accepted. [facility] will see patients 18 years of age and older. °One   Wednesday Evening (Monthly: Volunteer Based).  $30 per visit, cash only  °[facility]  (919) 537-3737 for adults; Children under age 4, call Graduate Pediatric Dentistry at (919) [phone]. Children aged 4-14, please call (919) [phone] to request a pediatric application. ° Dental services are provided in all areas of dental care including fillings, crowns and bridges, complete and partial dentures, implants, gum treatment, root canals, and extractions. Preventive care is also provided. Treatment is provided to both adults and children. °Patients are selected via a lottery and there is often a waiting list. °  °[facility] [address], °[place] ° (336) 763-8833 [email or web address].[email or web address] °  °[facility] [address], [place], [place] (336)723-1848, Ext. 123 Second and Fourth Thursday of each month, opens at 6:30 AM; Clinic ends at 9 AM.  Patients are seen on a first-come first-served basis, and a limited number are seen during each clinic.  ° °[facility] ° [address], [place], [place] (336) 723-7904    Eligibility Requirements °You must have lived in [place], [place], or [place] counties for at least the last three months. °  You cannot be eligible for state or federal sponsored healthcare insurance, including Veterans Administration, Medicaid, or Medicare. °  You generally cannot be eligible for healthcare insurance through your employer.  °  How to apply: °Eligibility screenings are held every Tuesday and Wednesday afternoon from 1:00 pm until 4:00 pm. You do not need an appointment for the interview!  °[facility] [address], [place], [place] 336-631-2330   °[facility]  336-342-8273   °[place]  336-703-3100   °[facility]  336-570-6415   ° °Behavioral Health Resources in the Community: °Intensive Outpatient Programs °Organization         Address  Phone  Notes  °[facility] [address], [place], [place] 336-878-6098   °[facility] [address], [place], [place] 336-832-9800   °[facility] [address], [place], [place] ° 336-882-2125   °[facility] [address],  °[place], [place] 1-800-853-5163 or 336-641-4981   °Substance Abuse Resources °Organization         Address  Phone  Notes  °[facility]  336-882-2125   °[facility]  336-784-9470   °[facility]  336-285-9073   °[facility]  336-845-3988   °Residential & Outpatient Substance Abuse Program  1-800-659-3381   °Psychological Services °Organization         Address  Phone  Notes  °[facility]  336- 832-9600   °[facility]  336- 378-7881   °[facility] [address], [place] 1-800-853-5163 or 336-641-4981   ° °Mobile Crisis Teams °Organization         Address  Phone  Notes  °[facility], Mobile Crisis Care Unit  1-877-626-1772   °Assertive °[facility] ° [address]  [place], [place] 336-834-9664   °Sharon DeEsch [address] °[place] 336-554-5454   ° °Self-Help/Support Groups °Organization         Address  Phone             Notes  °[facility]  - variety of support groups  336- 373-1402 Call for more information  °Narcotics Anonymous (NA), [facility] [address], °[place]  2 meetings at this location  ° °  Residential Treatment Programs Organization         Address  Phone  Notes  [facility] [address],    [place]  1-414-365-4147   [facility]  [address] [place] [address], [place], [place]   [place], [place] (920)802-7074 Admissions: 8am-3pm M-F  [facility] [place] [address],    [place], [place] 850-277-4128   [facility] [address] [place], [place], [place]   [facility] [address]  [place], [place]   [facility] Intensive Outpatient [address], [place], [place]   [facility] ([place] [place].,  [place], [place] 1-989-751-3573 or (603)818-2817   [facility] [address] [place], [place] Accepts Medicaid  [facility] [address],  [place] 1-(585)614-0355 Substance Abuse/Addiction Treatment   [facility] Organization         Address  Phone  Notes  [facility]  575-710-1013   Domenic Schwab, PhD [address] [place], [place]   401-113-1904 or (210)696-8012   [facility] [place], [place] 8648695264   [facility] [address], [place], [place] 254-425-8408 Insurance/Medicaid/sponsorship through [facility] [address]                                    [place], [place] [phone] [place] [address], [place] [phone]    Dr. Adele Schilder  [phone]   [facility] Dept. 1) [address] [place] 2) [place] 3)  [place] [address], [place] 720-718-0505 7340616509  860-192-1260   [place] 236-070-6947 or 905-644-4915 (After Hours)      Your blood pressure today was not significantly elevated. Take your blood pressure once per day, either in the morning or in the evening before you go to bed, two or three times per week.  Always sit quietly for at least 15 minutes before taking your blood pressure.  Keep a diary of your blood pressures to show your doctor at your follow up office visit.  Take over the counter tylenol, ibuprofen and benadryl, as directed on packaging, as needed for headache.  Keep a headache diary, as discussed.  Call your regular medical doctor today to schedule a follow up appointment within the next 3  days.  Return to the Emergency Department immediately sooner if worsening.

## 2015-02-25 NOTE — ED Notes (Signed)
Pt says she has been taking ibuprofen, sinus pills and BP pills (which belong to someone else).  Have pressure behind eyes.

## 2015-02-25 NOTE — ED Provider Notes (Signed)
CSN: [id number]     Arrival date & time 02/25/15  3557 History   First MD Initiated Contact with Patient 02/25/15 0848     Chief Complaint  Patient presents with  . Headache  . Medication Refill      HPI  Pt was seen at 0855. Per pt, c/o gradual onset and persistence of constant acute flair of her chronic headache for the past 3 days.  Describes the headache as located in her forehead and "pressure." Pt states she "thought my blood pressure was up" so she "started taking someone else's pills." Pt does not know what these pills are.  Denies headache was sudden or maximal in onset or at any time.  Denies visual changes, no focal motor weakness, no tingling/numbness in extremities, no fevers, no neck pain, no rash.  The symptoms have been associated with no other complaints. The patient has a significant history of similar symptoms previously, recently being evaluated for this complaint and multiple prior evals for same.      Past Medical History  Diagnosis Date  . Hypertension   . Bronchitis   . Headache    History reviewed. No pertinent past surgical history.   Family History  Problem Relation Age of Onset  . Heart failure Mother   . Coronary artery disease Mother   . Heart failure Father   . Coronary artery disease Father    History  Substance Use Topics  . Smoking status: Former Smoker -- 0.00 packs/day for 30 years  . Smokeless tobacco: Never Used  . Alcohol Use: 3.0 oz/week    5 Cans of beer per week     Comment: occasional   OB History    Gravida Para Term Preterm AB TAB SAB Ectopic Multiple Living   5 3 2 1 2  2         Review of Systems ROS: Statement: All systems negative except as marked or noted in the HPI; Constitutional: Negative for fever and chills. ; ; Eyes: Negative for eye pain, redness and discharge. ; ; ENMT: Negative for ear pain, hoarseness, nasal congestion, sore throat. +sinus pressure. ; ; Cardiovascular: Negative for chest pain, palpitations,  diaphoresis, dyspnea and peripheral edema. ; ; Respiratory: Negative for cough, wheezing and stridor. ; ; Gastrointestinal: Negative for nausea, vomiting, diarrhea, abdominal pain, blood in stool, hematemesis, jaundice and rectal bleeding. . ; ; Genitourinary: Negative for dysuria, flank pain and hematuria. ; ; Musculoskeletal: Negative for back pain and neck pain. Negative for swelling and trauma.; ; Skin: Negative for pruritus, rash, abrasions, blisters, bruising and skin lesion.; ; Neuro: Negative for lightheadedness and neck stiffness. Negative for weakness, altered level of consciousness , altered mental status, extremity weakness, paresthesias, involuntary movement, seizure and syncope.      Allergies  Review of patient's allergies indicates no known allergies.  Home Medications   Prior to Admission medications   Medication Sig Start Date End Date Taking? Authorizing Provider  sertraline (ZOLOFT) 50 MG tablet Take 1 tablet (50 mg total) by mouth daily. 02/14/15  Yes Milta Deiters T Mashburn, PA-C  traZODone (DESYREL) 50 MG tablet Take 1 tablet (50 mg total) by mouth at bedtime as needed for sleep. 02/14/15  Yes Milta Deiters T Mashburn, PA-C   BP 136/91 mmHg  Pulse 56  Temp(Src) 97.5 F (36.4 C) (Oral)  Resp 16  Ht 5\' 9"  (1.753 m)  Wt 170 lb 4.8 oz (77.248 kg)  BMI 25.14 kg/m2  SpO2 100% Physical Exam  0900: Physical examination:  Nursing notes reviewed; Vital signs and O2 SAT reviewed;  Constitutional: Well developed, Well nourished, Well hydrated, In no acute distress; Head:  Normocephalic, atraumatic; Eyes: EOMI, PERRL, No scleral icterus; ENMT: TM's clear bilat. +edemetous nasal turbinates bilat with clear rhinorrhea. +TTP frontal sinus and bilat maxillary sinuses. Mouth and pharynx normal, Mucous membranes moist; Neck: Supple, Full range of motion, No lymphadenopathy; Cardiovascular: Regular rate and rhythm, No murmur, rub, or gallop; Respiratory: Breath sounds clear & equal bilaterally, No rales,  rhonchi, wheezes.  Speaking full sentences with ease, Normal respiratory effort/excursion; Chest: Nontender, Movement normal; Abdomen: Soft, Nontender, Nondistended, Normal bowel sounds; Genitourinary: No CVA tenderness; Extremities: Pulses normal, No tenderness, No edema, No calf edema or asymmetry.; Neuro: AA&Ox3, Major CN grossly intact. No facial droop. Speech clear. No gross focal motor or sensory deficits in extremities. Climbs on and off stretcher easily by herself. Gait steady.; Skin: Color normal, Warm, Dry.   ED Course  Procedures      EKG Interpretation None      MDM  MDM Reviewed: previous chart, nursing note and vitals Reviewed previous: labs Interpretation: labs     Results for orders placed or performed during the hospital encounter of 02/25/15  I-stat Chem 8, ED  Result Value Ref Range   Sodium 141 135 - 145 mmol/L   Potassium 4.1 3.5 - 5.1 mmol/L   Chloride 107 96 - 112 mmol/L   BUN 13 6 - 23 mg/dL   Creatinine, Ser 0.80 0.50 - 1.10 mg/dL   Glucose, Bld 104 (H) 70 - 99 mg/dL   Calcium, Ion 1.22 1.12 - 1.23 mmol/L   TCO2 21 0 - 100 mmol/L   Hemoglobin 17.7 (H) 12.0 - 15.0 g/dL   HCT 52.0 (H) 36.0 - 46.0 %     0930:  EPIC records reviewed: pt does not have consistently high BP, actually has several recent readings 113-127/89-90 within the past 2 weeks. Pt instructed not to take other peoples medications, esp unlabeled medications, to keep a diary of her BP readings at home, and f/u with her PMD to review these readings. Pt verb understanding. Tx sinus congestion symptomatically at this time. Pt states she "feels better now" and wants to go home. Does not want tylenol/motrin. Dx and testing d/w pt.  Questions answered.  Verb understanding, agreeable to d/c home with outpt f/u.   Francine Graven, DO 02/28/15 1644

## 2015-02-25 NOTE — ED Notes (Signed)
Pt reports headache and hypertension x 2 days. Pt states she has been "taking other people's medicine for her BP."

## 2015-03-18 ENCOUNTER — Emergency Department (HOSPITAL_COMMUNITY)
Admission: EM | Admit: 2015-03-18 | Discharge: 2015-03-18 | Disposition: A | Discharge status: Home / Self Care | Payer: Medicaid Other | Attending: Emergency Medicine | Admitting: Emergency Medicine

## 2015-03-18 ENCOUNTER — Encounter (HOSPITAL_COMMUNITY): Payer: Self-pay | Admitting: Emergency Medicine

## 2015-03-18 DIAGNOSIS — Z79899 Other long term (current) drug therapy: Secondary | ICD-10-CM | POA: Diagnosis not present

## 2015-03-18 DIAGNOSIS — I10 Essential (primary) hypertension: Secondary | ICD-10-CM | POA: Diagnosis not present

## 2015-03-18 DIAGNOSIS — M545 Low back pain: Secondary | ICD-10-CM | POA: Diagnosis not present

## 2015-03-18 DIAGNOSIS — L732 Hidradenitis suppurativa: Secondary | ICD-10-CM | POA: Insufficient documentation

## 2015-03-18 DIAGNOSIS — Z8709 Personal history of other diseases of the respiratory system: Secondary | ICD-10-CM | POA: Diagnosis not present

## 2015-03-18 DIAGNOSIS — Z87891 Personal history of nicotine dependence: Secondary | ICD-10-CM | POA: Diagnosis not present

## 2015-03-18 DIAGNOSIS — L729 Follicular cyst of the skin and subcutaneous tissue, unspecified: Secondary | ICD-10-CM | POA: Diagnosis present

## 2015-03-18 DIAGNOSIS — G8929 Other chronic pain: Secondary | ICD-10-CM | POA: Insufficient documentation

## 2015-03-18 LAB — URINALYSIS, ROUTINE W REFLEX MICROSCOPIC
Bilirubin Urine: NEGATIVE
GLUCOSE, UA: NEGATIVE mg/dL
Ketones, ur: NEGATIVE mg/dL
LEUKOCYTES UA: NEGATIVE
Nitrite: NEGATIVE
PH: 5.5 (ref 5.0–8.0)
Protein, ur: NEGATIVE mg/dL
Specific Gravity, Urine: >1.03 — ABNORMAL HIGH (ref 1.005–1.030)
Urobilinogen, UA: 0.2 mg/dL (ref 0.0–1.0)

## 2015-03-18 LAB — URINE MICROSCOPIC-ADD ON

## 2015-03-18 MED ORDER — HYDROCODONE-ACETAMINOPHEN 5-325 MG PO TABS
2.0000 | ORAL_TABLET | Freq: Once | ORAL | Status: AC
  Administered 2015-03-18: 2 via ORAL
  Filled 2015-03-18: qty 2

## 2015-03-18 MED ORDER — HYDROCODONE-ACETAMINOPHEN 5-325 MG PO TABS: 1.0000 | ORAL_TABLET | ORAL | Status: DC | PRN

## 2015-03-18 MED ORDER — ONDANSETRON HCL 4 MG PO TABS: 4.0000 mg | ORAL_TABLET | Freq: Four times a day (QID) | ORAL | Status: DC

## 2015-03-18 MED ORDER — LIDOCAINE HCL (PF) 1 % IJ SOLN
INTRAMUSCULAR | Status: DC
Start: 2015-03-18 — End: 2015-03-18
  Filled 2015-03-18: qty 5

## 2015-03-18 MED ORDER — ONDANSETRON HCL 4 MG PO TABS
4.0000 mg | ORAL_TABLET | Freq: Once | ORAL | Status: AC
  Administered 2015-03-18: 4 mg via ORAL
  Filled 2015-03-18: qty 1

## 2015-03-18 MED ORDER — CEFTRIAXONE SODIUM 1 G IJ SOLR
1.0000 g | Freq: Once | INTRAMUSCULAR | Status: AC
  Administered 2015-03-18: 1 g via INTRAMUSCULAR
  Filled 2015-03-18: qty 10

## 2015-03-18 MED ORDER — MELOXICAM 15 MG PO TABS: 15.0000 mg | ORAL_TABLET | Freq: Every day | ORAL | Status: DC

## 2015-03-18 MED ORDER — FLUCONAZOLE 100 MG PO TABS
200.0000 mg | ORAL_TABLET | Freq: Once | ORAL | Status: AC
  Administered 2015-03-18: 200 mg via ORAL
  Filled 2015-03-18: qty 2

## 2015-03-18 MED ORDER — DOXYCYCLINE HYCLATE 100 MG PO CAPS: 100.0000 mg | ORAL_CAPSULE | Freq: Two times a day (BID) | ORAL | Status: DC

## 2015-03-18 MED ORDER — KETOROLAC TROMETHAMINE 10 MG PO TABS
10.0000 mg | ORAL_TABLET | Freq: Once | ORAL | Status: AC
  Administered 2015-03-18: 10 mg via ORAL
  Filled 2015-03-18: qty 1

## 2015-03-18 NOTE — ED Notes (Signed)
Pt verbalized understanding of no driving and to use caution within 4 hours of taking pain meds due to meds cause drowsiness 

## 2015-03-18 NOTE — ED Notes (Addendum)
Pt reports lower back pain that's worse with movement, urinary frequency, and mutiple "cysts" noted to bilateral axilla for last several days. Pt denies any known fevers, n/v/d.

## 2015-03-18 NOTE — ED Provider Notes (Signed)
CSN: [id number]     Arrival date & time 03/18/15  1128 History   First MD Initiated Contact with Patient 03/18/15 1221     Chief Complaint  Patient presents with  . Cyst     (Consider location/radiation/quality/duration/timing/severity/associated sxs/prior Treatment) HPI Comments: Patient is a 54 year old female who presents to the emergency department with a cyst under both arms. The patient also complains of some chronic back pain.  The patient states that this has been going on for nearly a week. She has not had any high fever. She's not had any drainage from areas under her arm. It is of note that she has had surgery for this problem in the past. She states that both arms hurt, the left armpit area is increase redness present.  The patient denies any loss of control of bowel or bladder. She has noticed some increasing urine frequency, and some lower back pain. She has a history of chronic back related issues.  The history is provided by the patient.    Past Medical History  Diagnosis Date  . Hypertension   . Bronchitis   . Headache    History reviewed. No pertinent past surgical history. Family History  Problem Relation Age of Onset  . Heart failure Mother   . Coronary artery disease Mother   . Heart failure Father   . Coronary artery disease Father    History  Substance Use Topics  . Smoking status: Former Smoker -- 0.00 packs/day for 30 years  . Smokeless tobacco: Never Used  . Alcohol Use: 3.0 oz/week    5 Cans of beer per week     Comment: occasional   OB History    Gravida Para Term Preterm AB TAB SAB Ectopic Multiple Living   5 3 2 1 2  2         Review of Systems  Constitutional: Negative for activity change.       All ROS Neg except as noted in HPI  HENT: Negative for nosebleeds.   Eyes: Negative for photophobia and discharge.  Respiratory: Negative for cough, shortness of breath and wheezing.   Cardiovascular: Negative for chest pain and palpitations.   Gastrointestinal: Negative for abdominal pain and blood in stool.  Genitourinary: Negative for dysuria, frequency and hematuria.  Musculoskeletal: Positive for back pain. Negative for arthralgias and neck pain.  Skin: Positive for wound.  Neurological: Negative for dizziness, seizures and speech difficulty.  Psychiatric/Behavioral: Negative for hallucinations and confusion.      Allergies  Review of patient's allergies indicates no known allergies.  Home Medications   Prior to Admission medications   Medication Sig Start Date End Date Taking? Authorizing Provider  sertraline (ZOLOFT) 50 MG tablet Take 1 tablet (50 mg total) by mouth daily. 02/14/15  Yes Milta Deiters T Mashburn, PA-C  traZODone (DESYREL) 50 MG tablet Take 1 tablet (50 mg total) by mouth at bedtime as needed for sleep. 02/14/15  Yes Marlane Hatcher Mashburn, PA-C  doxycycline (VIBRAMYCIN) 100 MG capsule Take 1 capsule (100 mg total) by mouth 2 (two) times daily. 03/18/15   Lily Kocher, PA-C  HYDROcodone-acetaminophen (NORCO/VICODIN) 5-325 MG per tablet Take 1 tablet by mouth every 4 (four) hours as needed. 03/18/15   Lily Kocher, PA-C  meloxicam (MOBIC) 15 MG tablet Take 1 tablet (15 mg total) by mouth daily. 03/18/15   Lily Kocher, PA-C  ondansetron (ZOFRAN) 4 MG tablet Take 1 tablet (4 mg total) by mouth every 6 (six) hours. 03/18/15   Lily Kocher,  PA-C   BP 149/108 mmHg  Pulse 72  Temp(Src) 98.3 F (36.8 C) (Oral)  Resp 18  Ht 5\' 9"  (1.753 m)  Wt 171 lb (77.565 kg)  BMI 25.24 kg/m2  SpO2 100% Physical Exam  Constitutional: [name], place, and time. She appears well-developed and well-nourished.  Non-toxic appearance.  HENT:  Head: Normocephalic.  Right Ear: Tympanic membrane and external ear normal.  Left Ear: Tympanic membrane and external ear normal.  Eyes: EOM and lids are normal. Pupils are equal, round, and reactive to light.  Neck: Normal range of motion. Neck supple. Carotid bruit is not present.   Cardiovascular: Normal rate, regular rhythm, normal heart sounds, intact distal pulses and normal pulses.   Pulmonary/Chest: Breath sounds normal. No respiratory distress.  There are well-healed surgical scars of both axilla. There are 3 raised red areas of the left axilla. There is increased redness going up the left triceps area. There is increased tenderness to palpation in these areas. There is no drainage at this time.  There is a area of increased tenderness under the skin to palpation near the suture line of the right axilla. No increased redness noticed. No drainage noted.  Abdominal: Soft. Bowel sounds are normal. There is no tenderness. There is no guarding.  Musculoskeletal: Normal range of motion.       Lumbar back: She exhibits tenderness and pain. She exhibits no deformity.  Lymphadenopathy:       Head (right side): No submandibular adenopathy present.       Head (left side): No submandibular adenopathy present.    She has no cervical adenopathy.  Neurological: She is alert and oriented to person, place, and time. She has normal strength. No cranial nerve deficit or sensory deficit.  Skin: Skin is warm and dry.  Psychiatric: She has a normal mood and affect. Her speech is normal.  Nursing note and vitals reviewed.   ED Course  Procedures (including critical care time) Labs Review Labs Reviewed  URINALYSIS, ROUTINE W REFLEX MICROSCOPIC - Abnormal; Notable for the following:    Specific Gravity, Urine >1.030 (*)    Hgb urine dipstick TRACE (*)    All other components within normal limits  URINE MICROSCOPIC-ADD ON - Abnormal; Notable for the following:    Squamous Epithelial / LPF FEW (*)    All other components within normal limits    Imaging Review No results found.   EKG Interpretation None      MDM  Patient has a history of hidradenitis, and today's examination is consistent with recurrence of the same. The urine analysis is negative for an infection. The  neurologic examination is negative for any acute neurologic changes related to her lower back pain. The patient is treated with Rocephin 1 g in the emergency department. Prescription for doxycycline, Norco, and Modic given to the patient. The patient is to follow-up with surgery if warm soaks and antibiotics are not effective with the red areas and abscess areas of the axilla.    Final diagnoses:  Hidradenitis    **TheI have reviewed nursing notes, vital signs, and all appropriate lab and imaging results for this patient.*  Already daycare  Lily Kocher, PA-C 03/18/15 [place], MD 03/19/15 [phone]

## 2015-03-18 NOTE — Discharge Instructions (Signed)
Please soak both armpit areas in warm Epsom salt water 2 times daily for 15 minutes. Please use doxycycline 2 times daily with food. Use mobile daily until all taken. Use Norco if needed for severe pain. Please see the surgeon listed above concerning your abscess areas if not improving. Your urine test is negative. Please see your physician at the health department concerning your increased urination and your back discomfort. Hidradenitis Suppurativa, Sweat Gland Abscess Hidradenitis suppurativa is a long lasting (chronic), uncommon disease of the sweat glands. With this, boil-like lumps and scarring develop in the groin, some times under the arms (axillae), and under the breasts. It may also uncommonly occur behind the ears, in the crease of the buttocks, and around the genitals.  CAUSES  The cause is from a blocking of the sweat glands. They then become infected. It may cause drainage and odor. It is not contagious. So it cannot be given to someone else. It most often shows up in puberty (about 60 to 53 years of age). But it may happen much later. It is similar to acne which is a disease of the sweat glands. This condition is slightly more common in African-Americans and women. SYMPTOMS   Hidradenitis usually starts as one or more red, tender, swellings in the groin or under the arms (axilla).  Over a period of hours to days the lesions get larger. They often open to the skin surface, draining clear to yellow-colored fluid.  The infected area heals with scarring. DIAGNOSIS  Your caregiver makes this diagnosis by looking at you. Sometimes cultures (growing germs on plates in the lab) may be taken. This is to see what germ (bacterium) is causing the infection.  TREATMENT   Topical germ killing medicine applied to the skin (antibiotics) are the treatment of choice. Antibiotics taken by mouth (systemic) are sometimes needed when the condition is getting worse or is severe.  Avoid tight-fitting  clothing which traps moisture in.  Dirt does not cause hidradenitis and it is not caused by poor hygiene.  Involved areas should be cleaned daily using an antibacterial soap. Some patients find that the liquid form of Lever 2000, applied to the involved areas as a lotion after bathing, can help reduce the odor related to this condition.  Sometimes surgery is needed to drain infected areas or remove scarred tissue. Removal of large amounts of tissue is used only in severe cases.  Birth control pills may be helpful.  Oral retinoids (vitamin A derivatives) for 6 to 12 months which are effective for acne may also help this condition.  Weight loss will improve but not cure hidradenitis. It is made worse by being overweight. But the condition is not caused by being overweight.  This condition is more common in people who have had acne.  It may become worse under stress. There is no medical cure for hidradenitis. It can be controlled, but not cured. The condition usually continues for years with periods of getting worse and getting better (remission). Document Released: 06/21/2004 Document Revised: 01/30/2012 Document Reviewed: 02/07/2014 [facility] Patient Information 2015 [place], [place]. This information is not intended to replace advice given to you by your health care provider. Make sure you discuss any questions you have with your health care provider.

## 2015-04-30 ENCOUNTER — Emergency Department (HOSPITAL_COMMUNITY): Payer: Medicaid Other

## 2015-04-30 ENCOUNTER — Encounter (HOSPITAL_COMMUNITY): Payer: Self-pay

## 2015-04-30 ENCOUNTER — Emergency Department (HOSPITAL_COMMUNITY)
Admission: EM | Admit: 2015-04-30 | Discharge: 2015-04-30 | Disposition: A | Discharge status: Home / Self Care | Payer: Medicaid Other | Attending: Emergency Medicine | Admitting: Emergency Medicine

## 2015-04-30 DIAGNOSIS — K029 Dental caries, unspecified: Secondary | ICD-10-CM | POA: Insufficient documentation

## 2015-04-30 DIAGNOSIS — F329 Major depressive disorder, single episode, unspecified: Secondary | ICD-10-CM | POA: Insufficient documentation

## 2015-04-30 DIAGNOSIS — R109 Unspecified abdominal pain: Secondary | ICD-10-CM | POA: Diagnosis not present

## 2015-04-30 DIAGNOSIS — K088 Other specified disorders of teeth and supporting structures: Secondary | ICD-10-CM | POA: Diagnosis present

## 2015-04-30 DIAGNOSIS — Z8709 Personal history of other diseases of the respiratory system: Secondary | ICD-10-CM | POA: Insufficient documentation

## 2015-04-30 DIAGNOSIS — R300 Dysuria: Secondary | ICD-10-CM | POA: Diagnosis not present

## 2015-04-30 DIAGNOSIS — I10 Essential (primary) hypertension: Secondary | ICD-10-CM | POA: Insufficient documentation

## 2015-04-30 DIAGNOSIS — K047 Periapical abscess without sinus: Secondary | ICD-10-CM | POA: Diagnosis not present

## 2015-04-30 DIAGNOSIS — Z87891 Personal history of nicotine dependence: Secondary | ICD-10-CM | POA: Diagnosis not present

## 2015-04-30 HISTORY — DX: Major depressive disorder, single episode, unspecified: F32.9

## 2015-04-30 HISTORY — DX: Cocaine abuse, uncomplicated: F14.10

## 2015-04-30 HISTORY — DX: Depression, unspecified: F32.A

## 2015-04-30 LAB — CBC WITH DIFFERENTIAL/PLATELET
Basophils Absolute: 0 10*3/uL (ref 0.0–0.1)
Basophils Relative: 0 % (ref 0–1)
EOS ABS: 0.1 10*3/uL (ref 0.0–0.7)
Eosinophils Relative: 2 % (ref 0–5)
HCT: 43.4 % (ref 36.0–46.0)
Hemoglobin: 14.5 g/dL (ref 12.0–15.0)
Lymphocytes Relative: 45 % (ref 12–46)
Lymphs Abs: 2.2 10*3/uL (ref 0.7–4.0)
MCH: 28.8 pg (ref 26.0–34.0)
MCHC: 33.4 g/dL (ref 30.0–36.0)
MCV: 86.1 fL (ref 78.0–100.0)
Monocytes Absolute: 0.4 10*3/uL (ref 0.1–1.0)
Monocytes Relative: 9 % (ref 3–12)
NEUTROS ABS: 2.2 10*3/uL (ref 1.7–7.7)
Neutrophils Relative %: 45 % (ref 43–77)
Platelets: 180 10*3/uL (ref 150–400)
RBC: 5.04 MIL/uL (ref 3.87–5.11)
RDW: 14 % (ref 11.5–15.5)
WBC: 4.9 10*3/uL (ref 4.0–10.5)

## 2015-04-30 LAB — URINALYSIS, ROUTINE W REFLEX MICROSCOPIC
Bilirubin Urine: NEGATIVE
Glucose, UA: NEGATIVE mg/dL
Hgb urine dipstick: NEGATIVE
Leukocytes, UA: NEGATIVE
Nitrite: NEGATIVE
PROTEIN: NEGATIVE mg/dL
Specific Gravity, Urine: >1.03 — ABNORMAL HIGH (ref 1.005–1.030)
Urobilinogen, UA: 0.2 mg/dL (ref 0.0–1.0)
pH: 5.5 (ref 5.0–8.0)

## 2015-04-30 LAB — BASIC METABOLIC PANEL
ANION GAP: 8 (ref 5–15)
BUN: 16 mg/dL (ref 6–20)
CALCIUM: 9.5 mg/dL (ref 8.9–10.3)
CHLORIDE: 108 mmol/L (ref 101–111)
CO2: 25 mmol/L (ref 22–32)
Creatinine, Ser: 1.01 mg/dL — ABNORMAL HIGH (ref 0.44–1.00)
GFR calc non Af Amer: >60 mL/min (ref >60)
GLUCOSE: 93 mg/dL (ref 65–99)
POTASSIUM: 3.9 mmol/L (ref 3.5–5.1)
Sodium: 141 mmol/L (ref 135–145)

## 2015-04-30 MED ORDER — PHENAZOPYRIDINE HCL 200 MG PO TABS: 200.0000 mg | ORAL_TABLET | Freq: Three times a day (TID) | ORAL | Status: DC

## 2015-04-30 MED ORDER — HYDROCODONE-ACETAMINOPHEN 5-325 MG PO TABS: 1.0000 | ORAL_TABLET | ORAL | Status: DC | PRN

## 2015-04-30 MED ORDER — AMOXICILLIN 500 MG PO CAPS: 500.0000 mg | ORAL_CAPSULE | Freq: Three times a day (TID) | ORAL | Status: AC

## 2015-04-30 MED ORDER — HYDROCODONE-ACETAMINOPHEN 5-325 MG PO TABS
1.0000 | ORAL_TABLET | Freq: Once | ORAL | Status: AC
  Administered 2015-04-30: 1 via ORAL
  Filled 2015-04-30: qty 1

## 2015-04-30 NOTE — ED Notes (Signed)
PA Evalee Jefferson at bedside.

## 2015-04-30 NOTE — ED Notes (Signed)
Patient reports multiple complaints. She is complaining of right sided dental pain along with Headache.  Burning with urination for 2 days and hesitancy. Pain lower abd pain

## 2015-04-30 NOTE — Discharge Instructions (Signed)
Dysuria Dysuria is the medical term for pain with urination. There are many causes for dysuria, but urinary tract infection is the most common. If a urinalysis was performed it can show that there is a urinary tract infection. A urine culture confirms that you or your child is sick. You will need to follow up with a healthcare provider because:  If a urine culture was done you will need to know the culture results and treatment recommendations.  If the urine culture was positive, you or your child will need to be put on antibiotics or know if the antibiotics prescribed are the right antibiotics for your urinary tract infection.  If the urine culture is negative (no urinary tract infection), then other causes may need to be explored or antibiotics need to be stopped. Today laboratory work may have been done and there does not seem to be an infection. If cultures were done they will take at least 24 to 48 hours to be completed. Today x-rays may have been taken and they read as normal. No cause can be found for the problems. The x-rays may be re-read by a radiologist and you will be contacted if additional findings are made. You or your child may have been put on medications to help with this problem until you can see your primary caregiver. If the problems get better, see your primary caregiver if the problems return. If you were given antibiotics (medications which kill germs), take all of the mediations as directed for the full course of treatment.  If laboratory work was done, you need to find the results. Leave a telephone number where you can be reached. If this is not possible, make sure you find out how you are to get test results. HOME CARE INSTRUCTIONS   Drink lots of fluids. For adults, drink eight, 8 ounce glasses of clear juice or water a day. For children, replace fluids as suggested by your caregiver.  Empty the bladder often. Avoid holding urine for long periods of time.  After a bowel  movement, women should cleanse front to back, using each tissue only once.  Empty your bladder before and after sexual intercourse.  Take all the medicine given to you until it is gone. You may feel better in a few days, but TAKE ALL MEDICINE.  Avoid caffeine, tea, alcohol and carbonated beverages, because they tend to irritate the bladder.  In men, alcohol may irritate the prostate.  Only take over-the-counter or prescription medicines for pain, discomfort, or fever as directed by your caregiver.  If your caregiver has given you a follow-up appointment, it is very important to keep that appointment. Not keeping the appointment could result in a chronic or permanent injury, pain, and disability. If there is any problem keeping the appointment, you must call back to this facility for assistance. SEEK IMMEDIATE MEDICAL CARE IF:   Back pain develops.  A fever develops.  There is nausea (feeling sick to your stomach) or vomiting (throwing up).  Problems are no better with medications or are getting worse. MAKE SURE YOU:   Understand these instructions.  Will watch your condition.  Will get help right away if you are not doing well or get worse. Document Released: 08/05/2004 Document Revised: 01/30/2012 Document Reviewed: 06/12/2008 [facility] Patient Information 2015 [place], [place]. This information is not intended to replace advice given to you by your health care provider. Make sure you discuss any questions you have with your health care provider.  Dental Abscess A  dental abscess is a collection of infected fluid (pus) from a bacterial infection in the inner part of the tooth (pulp). It usually occurs at the end of the tooth's root.  CAUSES   Severe tooth decay.  Trauma to the tooth that allows bacteria to enter into the pulp, such as a broken or chipped tooth. SYMPTOMS   Severe pain in and around the infected tooth.  Swelling and redness around the abscessed tooth or in the  mouth or face.  Tenderness.  Pus drainage.  Bad breath.  Bitter taste in the mouth.  Difficulty swallowing.  Difficulty opening the mouth.  Nausea.  Vomiting.  Chills.  Swollen neck glands. DIAGNOSIS   A medical and dental history will be taken.  An examination will be performed by tapping on the abscessed tooth.  X-rays may be taken of the tooth to identify the abscess. TREATMENT The goal of treatment is to eliminate the infection. You may be prescribed antibiotic medicine to stop the infection from spreading. A root canal may be performed to save the tooth. If the tooth cannot be saved, it may be pulled (extracted) and the abscess may be drained.  HOME CARE INSTRUCTIONS  Only take over-the-counter or prescription medicines for pain, fever, or discomfort as directed by your caregiver.  Rinse your mouth (gargle) often with salt water ( tsp salt in 8 oz [250 ml] of warm water) to relieve pain or swelling.  Do not drive after taking pain medicine (narcotics).  Do not apply heat to the outside of your face.  Return to your dentist for further treatment as directed. SEEK MEDICAL CARE IF:  Your pain is not helped by medicine.  Your pain is getting worse instead of better. SEEK IMMEDIATE MEDICAL CARE IF:  You have a fever or persistent symptoms for more than 2-3 days.  You have a fever and your symptoms suddenly get worse.  You have chills or a very bad headache.  You have problems breathing or swallowing.  You have trouble opening your mouth.  You have swelling in the neck or around the eye. Document Released: 11/07/2005 Document Revised: 08/01/2012 Document Reviewed: 02/15/2011 [facility] Patient Information 2015 [employer], [place]. This information is not intended to replace advice given to you by your health care provider. Make sure you discuss any questions you have with your health care provider.

## 2015-05-02 NOTE — ED Provider Notes (Signed)
CSN: [id number]     Arrival date & time 04/30/15  1111 History   First MD Initiated Contact with Patient 04/30/15 1135     Chief Complaint  Patient presents with  . Dental Pain     (Consider location/radiation/quality/duration/timing/severity/associated sxs/prior Treatment) The history is provided by the patient.   Becky [name] is a 54 y.o. female presenting with 2 complaints, the first being dental pain with gingival swelling and concern for possible dental abscess.  There has been no fevers, chills, nausea or vomiting, also no complaint of difficulty swallowing.  She has taken tylenol without relief of symptoms.  She also reports was recently treated for a urinary tract infection by her pcp, completed a 7 day course of ciprofloxacin last week but continues to have bladder pressure along with urinary frequency, hesitancy and burning. She denies hematuria.  She endorses a distal history of kidney stone passage.  [facility] and was advised to present here for assessment of kidney stones.      Past Medical History  Diagnosis Date  . Hypertension   . Bronchitis   . Headache   . Depression   . Cocaine abuse   . MDD (major depressive disorder)    History reviewed. No pertinent past surgical history. Family History  Problem Relation Age of Onset  . Heart failure Mother   . Coronary artery disease Mother   . Heart failure Father   . Coronary artery disease Father    History  Substance Use Topics  . Smoking status: Former Smoker -- 0.00 packs/day for 30 years  . Smokeless tobacco: Never Used  . Alcohol Use: 0.6 oz/week    1 Cans of beer per week     Comment: occasional   OB History    Gravida Para Term Preterm AB TAB SAB Ectopic Multiple Living   5 3 2 1 2  2         Review of Systems  Constitutional: Negative for fever and chills.  HENT: Positive for dental problem. Negative for congestion and sore throat.   Eyes: Negative.     Respiratory: Negative for chest tightness and shortness of breath.   Cardiovascular: Negative for chest pain.  Gastrointestinal: Negative for nausea, vomiting and abdominal pain.  Genitourinary: Positive for dysuria, urgency and frequency. Negative for hematuria, flank pain and vaginal discharge.  Musculoskeletal: Negative for joint swelling, arthralgias and neck pain.  Skin: Negative.  Negative for rash and wound.  Neurological: Negative for dizziness, weakness, light-headedness, numbness and headaches.  Psychiatric/Behavioral: Negative.       Allergies  Review of patient's allergies indicates no known allergies.  Home Medications   Prior to Admission medications   Medication Sig Start Date End Date Taking? Authorizing Provider  sertraline (ZOLOFT) 50 MG tablet Take 1 tablet (50 mg total) by mouth daily. 02/14/15  Yes Milta Deiters T Mashburn, PA-C  traZODone (DESYREL) 50 MG tablet Take 1 tablet (50 mg total) by mouth at bedtime as needed for sleep. 02/14/15  Yes Milta Deiters T Mashburn, PA-C  amoxicillin (AMOXIL) 500 MG capsule Take 1 capsule (500 mg total) by mouth 3 (three) times daily. 04/30/15 05/10/15  Evalee Jefferson, PA-C  HYDROcodone-acetaminophen (NORCO/VICODIN) 5-325 MG per tablet Take 1 tablet by mouth every 4 (four) hours as needed. 04/30/15   Evalee Jefferson, PA-C  phenazopyridine (PYRIDIUM) 200 MG tablet Take 1 tablet (200 mg total) by mouth 3 (three) times daily. 04/30/15   Evalee Jefferson, PA-C   BP  155/96 mmHg  Pulse 56  Temp(Src) 98.1 F (36.7 C) (Oral)  Resp 18  Ht 5\' 9"  (1.753 m)  Wt 166 lb (75.297 kg)  BMI 24.50 kg/m2  SpO2 99% Physical Exam  Constitutional: She appears well-developed and well-nourished.  HENT:  Head: Normocephalic and atraumatic.  Mouth/Throat: No trismus in the jaw. Abnormal dentition. Dental abscesses present.    Dental decay, edema of buccal side gingiva at the 30, 31 spaces. No facial edema or induration. No erythema.  Eyes: Conjunctivae are normal.  Neck: Normal  range of motion.  Cardiovascular: Normal rate, regular rhythm, normal heart sounds and intact distal pulses.   Pulmonary/Chest: Effort normal and breath sounds normal. She has no wheezes.  Abdominal: Soft. Bowel sounds are normal. She exhibits no distension. There is no tenderness. There is no rebound, no guarding and no CVA tenderness.  Non acute abdomen, no reproducible pain.  Musculoskeletal: Normal range of motion.  Neurological: She is alert.  Skin: Skin is warm and dry.  Psychiatric: She has a normal mood and affect.  Nursing note and vitals reviewed.   ED Course  Procedures (including critical care time) Labs Review Labs Reviewed  URINALYSIS, ROUTINE W REFLEX MICROSCOPIC (NOT AT [facility]) - Abnormal; Notable for the following:    Specific Gravity, Urine >1.030 (*)    Ketones, ur TRACE (*)    All other components within normal limits  BASIC METABOLIC PANEL - Abnormal; Notable for the following:    Creatinine, Ser 1.01 (*)    All other components within normal limits  CBC WITH DIFFERENTIAL/PLATELET    Imaging Review No results found.   EKG Interpretation None      MDM   Final diagnoses:  Abdominal pain  Dental infection  Dysuria    Results for orders placed or performed during the hospital encounter of 04/30/15  Urinalysis, Routine w reflex microscopic-may I&O cath if menses (not at [facility])  Result Value Ref Range   Color, Urine YELLOW YELLOW   APPearance CLEAR CLEAR   Specific Gravity, Urine >1.030 (H) 1.005 - 1.030   pH 5.5 5.0 - 8.0   Glucose, UA NEGATIVE NEGATIVE mg/dL   Hgb urine dipstick NEGATIVE NEGATIVE   Bilirubin Urine NEGATIVE NEGATIVE   Ketones, ur TRACE (A) NEGATIVE mg/dL   Protein, ur NEGATIVE NEGATIVE mg/dL   Urobilinogen, UA 0.2 0.0 - 1.0 mg/dL   Nitrite NEGATIVE NEGATIVE   Leukocytes, UA NEGATIVE NEGATIVE  CBC with Differential  Result Value Ref Range   WBC 4.9 4.0 - 10.5 K/uL   RBC 5.04 3.87 - 5.11 MIL/uL   Hemoglobin 14.5 12.0 - 15.0 g/dL    HCT 43.4 36.0 - 46.0 %   MCV 86.1 78.0 - 100.0 fL   MCH 28.8 26.0 - 34.0 pg   MCHC 33.4 30.0 - 36.0 g/dL   RDW 14.0 11.5 - 15.5 %   Platelets 180 150 - 400 K/uL   Neutrophils Relative % 45 43 - 77 %   Neutro Abs 2.2 1.7 - 7.7 K/uL   Lymphocytes Relative 45 12 - 46 %   Lymphs Abs 2.2 0.7 - 4.0 K/uL   Monocytes Relative 9 3 - 12 %   Monocytes Absolute 0.4 0.1 - 1.0 K/uL   Eosinophils Relative 2 0 - 5 %   Eosinophils Absolute 0.1 0.0 - 0.7 K/uL   Basophils Relative 0 0 - 1 %   Basophils Absolute 0.0 0.0 - 0.1 K/uL  Basic metabolic panel  Result Value Ref Range  Sodium 141 135 - 145 mmol/L   Potassium 3.9 3.5 - 5.1 mmol/L   Chloride 108 101 - 111 mmol/L   CO2 25 22 - 32 mmol/L   Glucose, Bld 93 65 - 99 mg/dL   BUN 16 6 - 20 mg/dL   Creatinine, Ser 1.01 (H) 0.44 - 1.00 mg/dL   Calcium 9.5 8.9 - 10.3 mg/dL   GFR calc non Af Amer >60 >60 mL/min   GFR calc Af Amer >60 >60 mL/min   Anion gap 8 5 - 15   Ct Renal Stone Study  04/30/2015   CLINICAL DATA:  Burning with urination for 2 days, abdominal pain  EXAM: CT ABDOMEN AND PELVIS WITHOUT CONTRAST  TECHNIQUE: Multidetector CT imaging of the abdomen and pelvis was performed following the standard protocol without IV contrast.  COMPARISON:  None.  FINDINGS: Lung bases are free of acute infiltrate or sizable effusion.  The liver, gallbladder, spleen, pancreas and adrenal glands are within normal limits. The kidneys are well visualized bilaterally without evidence of renal calculi or urinary tract obstructive changes. The ureters are within normal limits bilaterally. The appendix is within normal limits.  The bladder is decompressed. The uterus and ovaries appear within normal limits. No pelvic mass lesion is seen. No free fluid is noted. No acute bony abnormality is seen.  IMPRESSION: No acute abnormality noted.   Electronically Signed   By: Inez Catalina M.D.   On: 04/30/2015 13:10      Pt with early dental abscess with chronic dental  decay-she was placed on amoxil, hydrocodone, prescribed pyridium for dysuria.  Labs, imaging reviewed, no renal or ureteral stones, negative UA today.  Advised f/u with pcp for persistent or worsened sx.  Dental referrals given for definitive dental care.  Evalee Jefferson, PA-C 05/02/15 2118  [name], MD 05/03/15 [phone]

## 2015-05-20 ENCOUNTER — Other Ambulatory Visit (HOSPITAL_COMMUNITY): Payer: Self-pay | Admitting: Internal Medicine

## 2015-05-20 DIAGNOSIS — Z1231 Encounter for screening mammogram for malignant neoplasm of breast: Secondary | ICD-10-CM

## 2015-05-22 ENCOUNTER — Ambulatory Visit (HOSPITAL_COMMUNITY)
Admission: RE | Admit: 2015-05-22 | Discharge: 2015-05-22 | Disposition: A | Discharge status: Home / Self Care | Payer: Medicaid Other | Source: Ambulatory Visit | Attending: Internal Medicine | Admitting: Internal Medicine

## 2015-05-22 DIAGNOSIS — Z1231 Encounter for screening mammogram for malignant neoplasm of breast: Secondary | ICD-10-CM | POA: Insufficient documentation

## 2015-05-27 ENCOUNTER — Ambulatory Visit (HOSPITAL_COMMUNITY): Payer: Self-pay

## 2015-06-08 ENCOUNTER — Ambulatory Visit (INDEPENDENT_AMBULATORY_CARE_PROVIDER_SITE_OTHER): Payer: Medicaid Other | Admitting: Gastroenterology

## 2015-06-08 ENCOUNTER — Other Ambulatory Visit: Payer: Self-pay

## 2015-06-08 ENCOUNTER — Encounter: Payer: Self-pay | Admitting: Gastroenterology

## 2015-06-08 VITALS — BP 145/104 | HR 64 | Temp 97.0°F | Ht 69.0 in | Wt 165.2 lb

## 2015-06-08 DIAGNOSIS — K648 Other hemorrhoids: Secondary | ICD-10-CM | POA: Diagnosis not present

## 2015-06-08 DIAGNOSIS — K5900 Constipation, unspecified: Secondary | ICD-10-CM

## 2015-06-08 MED ORDER — PEG-KCL-NACL-NASULF-NA ASC-C 100 G PO SOLR: 1.0000 | ORAL | Status: DC

## 2015-06-08 MED ORDER — HYDROCORTISONE 2.5 % RE CREA: 1.0000 "application " | TOPICAL_CREAM | Freq: Two times a day (BID) | RECTAL | Status: DC

## 2015-06-08 MED ORDER — LINACLOTIDE 145 MCG PO CAPS: 145.0000 ug | ORAL_CAPSULE | Freq: Every day | ORAL | Status: DC

## 2015-06-08 NOTE — Progress Notes (Addendum)
Primary Care Physician:  Rosita Fire, MD Primary Gastroenterologist:  Dr. Oneida Alar   Chief Complaint  Patient presents with  . Hemorrhoids  . set up TCS    HPI:   Becky [name] is a 54 y.o. female presenting today at the request of Dr. Legrand Rams to schedule initial screening colonoscopy. She was brought in for an office visit due to chronic issues with hemorrhoids.   Chronic issues with hemorrhoids, at least 10 years. Painful. Difficult sitting at times. Intermittent bleeding. Sometimes "lots of it". States she has a hernia in her abdomen and "used to" discomfort. Sees Dr. Arnoldo Morale soon. Has reflux symptoms, takes baking soda/vinegar/water once or twice a week. Nexium historically. Doesn't want to take a PPI. No dysphagia. No N/V. Chronic constipation. Has tried OTC agents for constipation such as "generic constipation pills". Mag Citrate. Will skip days without a BM.   Past Medical History  Diagnosis Date  . Hypertension   . Bronchitis   . Headache   . Depression   . Cocaine abuse   . MDD (major depressive disorder)     Past Surgical History  Procedure Laterality Date  . Cysts removed from armpits bilaterally      Current Outpatient Prescriptions  Medication Sig Dispense Refill  . sertraline (ZOLOFT) 50 MG tablet Take 1 tablet (50 mg total) by mouth daily. 30 tablet 0  . traZODone (DESYREL) 50 MG tablet Take 1 tablet (50 mg total) by mouth at bedtime as needed for sleep. 15 tablet 0   No current facility-administered medications for this visit.    Allergies as of 06/08/2015  . (No Known Allergies)    Family History  Problem Relation Age of Onset  . Heart failure Mother   . Coronary artery disease Mother   . Heart failure Father   . Coronary artery disease Father     History   Social History  . Marital Status: Single    Spouse Name: N/A  . Number of Children: N/A  . Years of Education: N/A   Occupational History  . Not on file.   Social History Main  Topics  . Smoking status: Former Smoker -- 0.00 packs/day for 30 years  . Smokeless tobacco: Never Used     Comment: vague reports of smoking intermittently   . Alcohol Use: Yes     Comment: 40oz beer daily   . Drug Use: Yes     Comment: cocaine   . Sexual Activity: Yes   Other Topics Concern  . Not on file   Social History Narrative    Review of Systems: As mentioned in HPI.   Physical Exam: BP 145/104 mmHg  Pulse 64  Temp(Src) 97 F (36.1 C)  Ht 5\' 9"  (1.753 m)  Wt 165 lb 3.2 oz (74.934 kg)  BMI 24.38 kg/m2 General:   Alert and oriented. Pleasant and cooperative. Well-nourished and well-developed.  Head:  Normocephalic and atraumatic. Eyes:  Without icterus, sclera clear and conjunctiva pink.  Ears:  Normal auditory acuity. Nose:  No deformity, discharge,  or lesions. Mouth:  No deformity or lesions, oral mucosa pink.  Lungs:  Clear to auscultation bilaterally. No wheezes, rales, or rhonchi. No distress.  Heart:  S1, S2 present without murmurs appreciated.  Abdomen:  +BS, soft, non-tender and non-distended. No HSM noted. No guarding or rebound. Query ventral hernia, easily reducible.  Rectal:  Prolapsed internal hemorrhoids, rectal discomfort, patient declined internal exam. No gross bleeding noted.  Msk:  Symmetrical  without gross deformities. Normal posture. Extremities:  Without clubbing or edema. Neurologic:  Alert and  oriented x4;  grossly normal neurologically. Psych:  Alert and cooperative. Normal mood and affect.

## 2015-06-08 NOTE — Assessment & Plan Note (Signed)
54 year old female with history of chronic hemorrhoids per report, noting intermittent hematochezia likely secondary to obvious prolapsing internal hemorrhoids on exam. Recommend initial screening colonoscopy for full lower GI evaluation and possible hemorrhoid banding if appropriate at time of colonoscopy.   As of note, she uses cocaine intermittently. Discussed importance of drug cessation and that a clean drug screen would be necessary prior to colonoscopy. She states understanding.   Proceed with colonoscopy with Dr. Oneida Alar in the near future. The risks, benefits, and alternatives have been discussed in detail with the patient. They state understanding and desire to proceed.  PROPOFOL due to drug and ETOH abuse Anusol cream BID X 7 days Hemorrhoid care discussed Linzess 145 mcg samples and rx provided

## 2015-06-08 NOTE — Patient Instructions (Addendum)
For constipation: start taking Linzess 1 capsule each morning, 30 minutes before breakfast. I have provided samples and sent in a prescription.   For hemorrhoids: use anusol cream twice a day for 7-10 days. Avoid straining. Do sitz baths after every bowel movement.   We have scheduled you for a colonoscopy with possible hemorrhoid banding with Dr. Oneida Alar in near future. Avoid all drug use. Your drug screen will need to be clean before you have the procedure.  Hemorrhoids Hemorrhoids are swollen veins around the rectum or anus. There are two types of hemorrhoids:   Internal hemorrhoids. These occur in the veins just inside the rectum. They may poke through to the outside and become irritated and painful.  External hemorrhoids. These occur in the veins outside the anus and can be felt as a painful swelling or hard lump near the anus. CAUSES  Pregnancy.   Obesity.   Constipation or diarrhea.   Straining to have a bowel movement.   Sitting for long periods on the toilet.  Heavy lifting or other activity that caused you to strain.  Anal intercourse. SYMPTOMS   Pain.   Anal itching or irritation.   Rectal bleeding.   Fecal leakage.   Anal swelling.   One or more lumps around the anus.  DIAGNOSIS  Your caregiver may be able to diagnose hemorrhoids by visual examination. Other examinations or tests that may be performed include:   Examination of the rectal area with a gloved hand (digital rectal exam).   Examination of anal canal using a small tube (scope).   A blood test if you have lost a significant amount of blood.  A test to look inside the colon (sigmoidoscopy or colonoscopy). TREATMENT Most hemorrhoids can be treated at home. However, if symptoms do not seem to be getting better or if you have a lot of rectal bleeding, your caregiver may perform a procedure to help make the hemorrhoids get smaller or remove them completely. Possible treatments include:    Placing a rubber band at the base of the hemorrhoid to cut off the circulation (rubber band ligation).   Injecting a chemical to shrink the hemorrhoid (sclerotherapy).   Using a tool to burn the hemorrhoid (infrared light therapy).   Surgically removing the hemorrhoid (hemorrhoidectomy).   Stapling the hemorrhoid to block blood flow to the tissue (hemorrhoid stapling).  HOME CARE INSTRUCTIONS   Eat foods with fiber, such as whole grains, beans, nuts, fruits, and vegetables. Ask your doctor about taking products with added fiber in them (fibersupplements).  Increase fluid intake. Drink enough water and fluids to keep your urine clear or pale yellow.   Exercise regularly.   Go to the bathroom when you have the urge to have a bowel movement. Do not wait.   Avoid straining to have bowel movements.   Keep the anal area dry and clean. Use wet toilet paper or moist towelettes after a bowel movement.   Medicated creams and suppositories may be used or applied as directed.   Only take over-the-counter or prescription medicines as directed by your caregiver.   Take warm sitz baths for 15-20 minutes, 3-4 times a day to ease pain and discomfort.   Place ice packs on the hemorrhoids if they are tender and swollen. Using ice packs between sitz baths may be helpful.   Put ice in a plastic bag.   Place a towel between your skin and the bag.   Leave the ice on for 15-20 minutes, 3-4 times  a day.   Do not use a donut-shaped pillow or sit on the toilet for long periods. This increases blood pooling and pain.  SEEK MEDICAL CARE IF:  You have increasing pain and swelling that is not controlled by treatment or medicine.  You have uncontrolled bleeding.  You have difficulty or you are unable to have a bowel movement.  You have pain or inflammation outside the area of the hemorrhoids. MAKE SURE YOU:  Understand these instructions.  Will watch your condition.  Will  get help right away if you are not doing well or get worse. Document Released: 11/04/2000 Document Revised: 10/24/2012 Document Reviewed: 09/11/2012 [facility] Patient Information 2015 [place], [place]. This information is not intended to replace advice given to you by your health care provider. Make sure you discuss any questions you have with your health care provider.

## 2015-06-08 NOTE — Assessment & Plan Note (Signed)
Chronic. Start Linzess 145 mcg daily.

## 2015-06-08 NOTE — Progress Notes (Signed)
CC'ED TO PCP 

## 2015-06-15 NOTE — Patient Instructions (Signed)
Becky [name]  06/15/2015     @PREFPERIOPPHARMACY @   Your procedure is scheduled on  06/23/2015  Report to [facility] at   1145 A.M.  Call this number if you have problems the morning of surgery:  [phone]   Remember:  Do not eat food or drink liquids after midnight.  Take these medicines the morning of surgery with A SIP OF WATER  zoloft   Do not wear jewelry, make-up or nail polish.  Do not wear lotions, powders, or perfumes.   Do not shave 48 hours prior to surgery.  Men may shave face and neck.  Do not bring valuables to the hospital.  [facility] is not responsible for any belongings or valuables.  Contacts, dentures or bridgework may not be worn into surgery.  Leave your suitcase in the car.  After surgery it may be brought to your room.  For patients admitted to the hospital, discharge time will be determined by your treatment team.  Patients discharged the day of surgery will not be allowed to drive home.   Name and phone number of your driver:   family Special instructions:  Follow your prep and diet instructions given to you by Dr Nona Dell office.  Please read over the following fact sheets that you were given. Pain Booklet, Coughing and Deep Breathing, Surgical Site Infection Prevention, Anesthesia Post-op Instructions and Care and Recovery After Surgery      Colonoscopy A colonoscopy is an exam to look at the entire large intestine (colon). This exam can help find problems such as tumors, polyps, inflammation, and areas of bleeding. The exam takes about 1 hour.  LET [facility] CARE PROVIDER KNOW ABOUT:   Any allergies you have.  All medicines you are taking, including vitamins, herbs, eye drops, creams, and over-the-counter medicines.  Previous problems you or members of your family have had with the use of anesthetics.  Any blood disorders you have.  Previous surgeries you have had.  Medical conditions you have. RISKS AND COMPLICATIONS    Generally, this is a safe procedure. However, as with any procedure, complications can occur. Possible complications include:  Bleeding.  Tearing or rupture of the colon wall.  Reaction to medicines given during the exam.  Infection (rare). BEFORE THE PROCEDURE   Ask your health care provider about changing or stopping your regular medicines.  You may be prescribed an oral bowel prep. This involves drinking a large amount of medicated liquid, starting the day before your procedure. The liquid will cause you to have multiple loose stools until your stool is almost clear or light green. This cleans out your colon in preparation for the procedure.  Do not eat or drink anything else once you have started the bowel prep, unless your health care provider tells you it is safe to do so.  Arrange for someone to drive you home after the procedure. PROCEDURE   You will be given medicine to help you relax (sedative).  You will lie on your side with your knees bent.  A long, flexible tube with a light and camera on the end (colonoscope) will be inserted through the rectum and into the colon. The camera sends video back to a computer screen as it moves through the colon. The colonoscope also releases carbon dioxide gas to inflate the colon. This helps your health care provider see the area better.  During the exam, your health care provider may take a small tissue  sample (biopsy) to be examined under a microscope if any abnormalities are found.  The exam is finished when the entire colon has been viewed. AFTER THE PROCEDURE   Do not drive for 24 hours after the exam.  You may have a small amount of blood in your stool.  You may pass moderate amounts of gas and have mild abdominal cramping or bloating. This is caused by the gas used to inflate your colon during the exam.  Ask when your test results will be ready and how you will get your results. Make sure you get your test results. Document  Released: 11/04/2000 Document Revised: 08/28/2013 Document Reviewed: 07/15/2013 [facility] Patient Information 2015 [place], [place]. This information is not intended to replace advice given to you by your health care provider. Make sure you discuss any questions you have with your health care provider. PATIENT INSTRUCTIONS POST-ANESTHESIA  IMMEDIATELY FOLLOWING SURGERY:  Do not drive or operate machinery for the first twenty four hours after surgery.  Do not make any important decisions for twenty four hours after surgery or while taking narcotic pain medications or sedatives.  If you develop intractable nausea and vomiting or a severe headache please notify your doctor immediately.  FOLLOW-UP:  Please make an appointment with your surgeon as instructed. You do not need to follow up with anesthesia unless specifically instructed to do so.  WOUND CARE INSTRUCTIONS (if applicable):  Keep a dry clean dressing on the anesthesia/puncture wound site if there is drainage.  Once the wound has quit draining you may leave it open to air.  Generally you should leave the bandage intact for twenty four hours unless there is drainage.  If the epidural site drains for more than 36-48 hours please call the anesthesia department.  QUESTIONS?:  Please feel free to call your physician or the hospital operator if you have any questions, and they will be happy to assist you.

## 2015-06-16 ENCOUNTER — Encounter (HOSPITAL_COMMUNITY): Payer: Self-pay

## 2015-06-16 ENCOUNTER — Encounter (HOSPITAL_COMMUNITY)
Admission: RE | Admit: 2015-06-16 | Discharge: 2015-06-16 | Disposition: A | Discharge status: Home / Self Care | Payer: Medicaid Other | Source: Ambulatory Visit | Attending: Gastroenterology | Admitting: Gastroenterology

## 2015-06-23 ENCOUNTER — Ambulatory Visit (HOSPITAL_COMMUNITY): Payer: Medicaid Other | Admitting: Anesthesiology

## 2015-06-23 ENCOUNTER — Ambulatory Visit (HOSPITAL_COMMUNITY)
Admission: RE | Admit: 2015-06-23 | Discharge: 2015-06-23 | Disposition: A | Discharge status: Home / Self Care | Payer: Medicaid Other | Source: Ambulatory Visit | Attending: Gastroenterology | Admitting: Gastroenterology

## 2015-06-23 ENCOUNTER — Encounter (HOSPITAL_COMMUNITY): Payer: Self-pay

## 2015-06-23 ENCOUNTER — Encounter (HOSPITAL_COMMUNITY)
Admission: RE | Disposition: A | Discharge status: Home / Self Care | Payer: Self-pay | Source: Ambulatory Visit | Attending: Gastroenterology

## 2015-06-23 DIAGNOSIS — Z79899 Other long term (current) drug therapy: Secondary | ICD-10-CM | POA: Insufficient documentation

## 2015-06-23 DIAGNOSIS — F329 Major depressive disorder, single episode, unspecified: Secondary | ICD-10-CM | POA: Diagnosis not present

## 2015-06-23 DIAGNOSIS — D124 Benign neoplasm of descending colon: Secondary | ICD-10-CM | POA: Diagnosis not present

## 2015-06-23 DIAGNOSIS — I10 Essential (primary) hypertension: Secondary | ICD-10-CM | POA: Insufficient documentation

## 2015-06-23 DIAGNOSIS — K644 Residual hemorrhoidal skin tags: Secondary | ICD-10-CM | POA: Diagnosis not present

## 2015-06-23 DIAGNOSIS — K648 Other hemorrhoids: Secondary | ICD-10-CM | POA: Insufficient documentation

## 2015-06-23 DIAGNOSIS — D128 Benign neoplasm of rectum: Secondary | ICD-10-CM | POA: Diagnosis not present

## 2015-06-23 DIAGNOSIS — K921 Melena: Secondary | ICD-10-CM

## 2015-06-23 HISTORY — PX: COLONOSCOPY WITH PROPOFOL: SHX5780

## 2015-06-23 HISTORY — PX: POLYPECTOMY: SHX5525

## 2015-06-23 LAB — RAPID URINE DRUG SCREEN, HOSP PERFORMED
AMPHETAMINES: NOT DETECTED
BARBITURATES: NOT DETECTED
BENZODIAZEPINES: NOT DETECTED
COCAINE: NOT DETECTED
OPIATES: NOT DETECTED
Tetrahydrocannabinol: NOT DETECTED

## 2015-06-23 SURGERY — COLONOSCOPY WITH PROPOFOL
Anesthesia: Monitor Anesthesia Care

## 2015-06-23 MED ORDER — ONDANSETRON HCL 4 MG/2ML IJ SOLN
INTRAMUSCULAR | Status: AC
  Filled 2015-06-23: qty 2

## 2015-06-23 MED ORDER — PROPOFOL 10 MG/ML IV BOLUS
INTRAVENOUS | Status: AC
  Filled 2015-06-23: qty 20

## 2015-06-23 MED ORDER — FENTANYL CITRATE (PF) 100 MCG/2ML IJ SOLN: 25.0000 ug | INTRAMUSCULAR | Status: DC | PRN

## 2015-06-23 MED ORDER — MIDAZOLAM HCL 5 MG/5ML IJ SOLN
INTRAMUSCULAR | Status: DC | PRN
  Administered 2015-06-23: 2 mg via INTRAVENOUS

## 2015-06-23 MED ORDER — WATER FOR IRRIGATION, STERILE IR SOLN
Status: DC | PRN
  Administered 2015-06-23: 1000 mL

## 2015-06-23 MED ORDER — MIDAZOLAM HCL 2 MG/2ML IJ SOLN
1.0000 mg | INTRAMUSCULAR | Status: DC | PRN
  Administered 2015-06-23: 2 mg via INTRAVENOUS

## 2015-06-23 MED ORDER — PROPOFOL INFUSION 10 MG/ML OPTIME
INTRAVENOUS | Status: DC | PRN
  Administered 2015-06-23 (×2): via INTRAVENOUS
  Administered 2015-06-23: 100 ug/kg/min via INTRAVENOUS

## 2015-06-23 MED ORDER — ONDANSETRON HCL 4 MG/2ML IJ SOLN
4.0000 mg | Freq: Once | INTRAMUSCULAR | Status: AC
  Administered 2015-06-23: 4 mg via INTRAVENOUS

## 2015-06-23 MED ORDER — ONDANSETRON HCL 4 MG/2ML IJ SOLN: 4.0000 mg | Freq: Once | INTRAMUSCULAR | Status: DC | PRN

## 2015-06-23 MED ORDER — FENTANYL CITRATE (PF) 100 MCG/2ML IJ SOLN
INTRAMUSCULAR | Status: AC
  Filled 2015-06-23: qty 2

## 2015-06-23 MED ORDER — MIDAZOLAM HCL 2 MG/2ML IJ SOLN
INTRAMUSCULAR | Status: AC
  Filled 2015-06-23: qty 2

## 2015-06-23 MED ORDER — FENTANYL CITRATE (PF) 100 MCG/2ML IJ SOLN
25.0000 ug | INTRAMUSCULAR | Status: AC
  Administered 2015-06-23 (×2): 25 ug via INTRAVENOUS

## 2015-06-23 MED ORDER — EPHEDRINE SULFATE 50 MG/ML IJ SOLN
INTRAMUSCULAR | Status: DC | PRN
  Administered 2015-06-23 (×2): 10 mg via INTRAVENOUS

## 2015-06-23 MED ORDER — LACTATED RINGERS IV SOLN
INTRAVENOUS | Status: DC
  Administered 2015-06-23: 13:00:00 via INTRAVENOUS

## 2015-06-23 SURGICAL SUPPLY — 21 items
ELECT REM PT RETURN 9FT ADLT (ELECTROSURGICAL) ×3
ELECTRODE REM PT RTRN 9FT ADLT (ELECTROSURGICAL) IMPLANT
FCP BXJMBJMB 240X2.8X (CUTTING FORCEPS)
FLOOR PAD 36X40 (MISCELLANEOUS) ×3
FORCEPS BIOP RAD 4 LRG CAP 4 (CUTTING FORCEPS) ×2 IMPLANT
FORCEPS BIOP RJ4 240 W/NDL (CUTTING FORCEPS)
FORCEPS BXJMBJMB 240X2.8X (CUTTING FORCEPS) IMPLANT
FORMALIN 10 PREFIL 20ML (MISCELLANEOUS) ×2 IMPLANT
INJECTOR/SNARE I SNARE (MISCELLANEOUS) IMPLANT
KIT ENDO PROCEDURE PEN (KITS) ×3 IMPLANT
MANIFOLD NEPTUNE II (INSTRUMENTS) ×2 IMPLANT
NDL SCLEROTHERAPY 25GX240 (NEEDLE) IMPLANT
NEEDLE SCLEROTHERAPY 25GX240 (NEEDLE) IMPLANT
PAD FLOOR 36X40 (MISCELLANEOUS) ×1 IMPLANT
PROBE APC STR FIRE (PROBE) IMPLANT
PROBE INJECTION GOLD (MISCELLANEOUS)
PROBE INJECTION GOLD 7FR (MISCELLANEOUS) IMPLANT
SNARE SHORT THROW 13M SML OVAL (MISCELLANEOUS) ×3 IMPLANT
TRAP SPECIMEN MUCOUS 40CC (MISCELLANEOUS) IMPLANT
TUBING IRRIGATION ENDOGATOR (MISCELLANEOUS) ×2 IMPLANT
WATER STERILE IRR 1000ML POUR (IV SOLUTION) ×2 IMPLANT

## 2015-06-23 NOTE — Op Note (Signed)
[facility] [address] [place], [address]   COLONOSCOPY PROCEDURE REPORT  PATIENT: Becky, [name]  MR#: [id number] BIRTHDATE: February 14, 1961 , 53  yrs. old GENDER: female ENDOSCOPIST: Danie Binder, MD REFERRED IR:CVELFYBO Fanta, M.D. PROCEDURE DATE:  July 20, 2015 PROCEDURE:   Colonoscopy with cold biopsy polypectomy and Colonoscopy with snare polypectomy INDICATIONS:hematochezia. MEDICATIONS: Monitored anesthesia care  DESCRIPTION OF PROCEDURE:    Physical exam was performed.  Informed consent was obtained from the patient after explaining the benefits, risks, and alternatives to procedure.  The patient was connected to monitor and placed in left lateral position. Continuous oxygen was provided by nasal cannula and IV medicine administered through an indwelling cannula.  After administration of sedation and rectal exam, the patients rectum was intubated and the     colonoscope was advanced under direct visualization to the ileum.  The scope was removed slowly by carefully examining the color, texture, anatomy, and integrity mucosa on the way out.  The patient was recovered in endoscopy and discharged home in satisfactory condition. Estimated blood loss is zero unless otherwise noted in this procedure report.    COLON FINDINGS: The examined terminal ileum appeared to be normal. , A sessile polyp was found in the descending colon.  A polypectomy was performed using snare cautery.  , A sessile polyp measuring 3 mm in size was found in the rectum.  A polypectomy was performed with cold forceps.  , and Large internal and external hemorrhoids were found.  PREP QUALITY: excellent.  CECAL W/D TIME: 17       minutes COMPLICATIONS: None  ENDOSCOPIC IMPRESSION: 1.   RECTAL BLEEDING/PAIN DUE TO HEMORRHOIDS 2.   ONE LARGE AND ONE SMALL COLORECTAL POLYP REMOVED  RECOMMENDATIONS: AVOID CONSTIPATION. DRINK WATER TO KEEP YOUR URINE LIGHT YELLOW. FOLLOW A HIGH FIBER  DIET. CONTINUE LINZESS. AWAIT BIOPSY RESULTS. SEE SURGERY FOR A HEMORHROIDECTOMY. Next colonoscopy in 1-3 years.  YOUR SISTERS, BROTHERS, CHILDREN, AND PARENTS NEED TO HAVE A COLONOSCOPY STARTING AT THE AGE OF 40.   eSigned:  Danie Binder, MD 07-20-2015 4:53 PM   CPT CODES: ICD CODES:  The ICD and CPT codes recommended by this software are interpretations from the data that the clinical staff has captured with the software.  The verification of the translation of this report to the ICD and CPT codes and modifiers is the sole responsibility of the health care institution and practicing physician where this report was generated.  [employer] will not be held responsible for the validity of the ICD and CPT codes included on this report.  AMA assumes no liability for data contained or not contained herein. CPT is a Designer, television/film set of the [employer].

## 2015-06-23 NOTE — H&P (Signed)
  Primary Care Physician:  Rosita Fire, MD Primary Gastroenterologist:  Dr. Oneida Alar  Pre-Procedure History & Physical: HPI:  Becky [name] is a 54 y.o. female here for  BRBPR.  Past Medical History  Diagnosis Date  . Hypertension   . Bronchitis   . Headache   . Depression   . Cocaine abuse   . MDD (major depressive disorder)     Past Surgical History  Procedure Laterality Date  . Cysts removed from armpits bilaterally      Prior to Admission medications   Medication Sig Start Date End Date Taking? Authorizing Provider  hydrocortisone (ANUSOL-HC) 2.5 % rectal cream Place 1 application rectally 2 (two) times daily. For 10 days 06/08/15  Yes Orvil Feil, NP  ibuprofen (ADVIL,MOTRIN) 200 MG tablet Take 200 mg by mouth every 6 (six) hours as needed for moderate pain.   Yes Historical Provider, MD  Linaclotide Rolan Lipa) 145 MCG CAPS capsule Take 1 capsule (145 mcg total) by mouth daily. 30 minutes before breakfast 06/08/15  Yes Orvil Feil, NP  peg 3350 powder (MOVIPREP) 100 G SOLR Take 1 kit (200 g total) by mouth as directed. 06/08/15  Yes Danie Binder, MD  sertraline (ZOLOFT) 50 MG tablet Take 1 tablet (50 mg total) by mouth daily. 02/14/15  Yes Milta Deiters T Mashburn, PA-C  traZODone (DESYREL) 50 MG tablet Take 1 tablet (50 mg total) by mouth at bedtime as needed for sleep. 02/14/15  Yes Ruben Im, PA-C    Allergies as of 06/08/2015  . (No Known Allergies)    Family History  Problem Relation Age of Onset  . Heart failure Mother   . Coronary artery disease Mother   . Heart failure Father   . Coronary artery disease Father   . Colon cancer Neg Hx     History   Social History  . Marital Status: Single    Spouse Name: N/A  . Number of Children: N/A  . Years of Education: N/A   Occupational History  . Not on file.   Social History Main Topics  . Smoking status: Never Smoker   . Smokeless tobacco: Never Used     Comment: vague reports of smoking intermittently   .  Alcohol Use: Yes     Comment: 40oz beer daily   . Drug Use: Yes    Special: Cocaine     Comment: cocaine History  . Sexual Activity: Yes   Other Topics Concern  . Not on file   Social History Narrative    Review of Systems: See HPI, otherwise negative ROS   Physical Exam: BP 149/103 mmHg  Temp(Src) 97.8 F (36.6 C) (Oral)  Resp 15  SpO2 100% General:   Alert,  pleasant and cooperative in NAD Head:  Normocephalic and atraumatic. Neck:  Supple; Lungs:  Clear throughout to auscultation.    Heart:  Regular rate and rhythm. Abdomen:  Soft, nontender and nondistended. Normal bowel sounds, without guarding, and without rebound.   Neurologic:  Alert and  oriented x4;  grossly normal neurologically.  Impression/Plan:    BRBPR  PLAN: TCS TODAY

## 2015-06-23 NOTE — Discharge Instructions (Signed)
You had 1 large and 1 small polyp removed. You have LARGE internal AND EXTERNAL hemorrhoids.   AVOID CONSTIPATION. SEE INFO BELOW.  DRINK WATER TO KEEP YOUR URINE LIGHT YELLOW.  FOLLOW A HIGH FIBER DIET. AVOID ITEMS THAT CAUSE BLOATING & GAS. SEE INFO BELOW.  CONTINUE LINZESS.  YOUR BIOPSY RESULTS WILL BE AVAILABLE IN MY CHART AFTER AUG 4 AND MY OFFICE WILL CONTACT YOU IN 10-14 DAYS WITH YOUR RESULTS.   Next colonoscopy in 1-3 years. YOUR SISTERS, BROTHERS, CHILDREN, AND PARENTS NEED TO HAVE A COLONOSCOPY STARTING AT THE AGE OF 40.   Colonoscopy Care After Read the instructions outlined below and refer to this sheet in the next week. These discharge instructions provide you with general information on caring for yourself after you leave the hospital. While your treatment has been planned according to the most current medical practices available, unavoidable complications occasionally occur. If you have any problems or questions after discharge, call DR. Alberto Pina, [phone].  ACTIVITY  You may resume your regular activity, but move at a slower pace for the next 24 hours.   Take frequent rest periods for the next 24 hours.   Walking will help get rid of the air and reduce the bloated feeling in your belly (abdomen).   No driving for 24 hours (because of the medicine (anesthesia) used during the test).   You may shower.   Do not sign any important legal documents or operate any machinery for 24 hours (because of the anesthesia used during the test).    NUTRITION  Drink plenty of fluids.   You may resume your normal diet as instructed by your doctor.   Begin with a light meal and progress to your normal diet. Heavy or fried foods are harder to digest and may make you feel sick to your stomach (nauseated).   Avoid alcoholic beverages for 24 hours or as instructed.    MEDICATIONS  You may resume your normal medications.   WHAT YOU CAN EXPECT TODAY  Some feelings of  bloating in the abdomen.   Passage of more gas than usual.   Spotting of blood in your stool or on the toilet paper  .  IF YOU HAD POLYPS REMOVED DURING THE COLONOSCOPY:  Eat a soft diet IF YOU HAVE NAUSEA, BLOATING, ABDOMINAL PAIN, OR VOMITING.    FINDING OUT THE RESULTS OF YOUR TEST Not all test results are available during your visit. DR. Oneida Alar WILL CALL YOU WITHIN 14 DAYS OF YOUR PROCEDUE WITH YOUR RESULTS. Do not assume everything is normal if you have not heard from DR. Leylanie Woodmansee, CALL HER OFFICE AT [phone].  SEEK IMMEDIATE MEDICAL ATTENTION AND CALL THE OFFICE: [phone] IF:  You have more than a spotting of blood in your stool.   Your belly is swollen (abdominal distention).   You are nauseated or vomiting.   You have a temperature over 101F.   You have abdominal pain or discomfort that is severe or gets worse throughout the day.   Constipation in Adults Constipation is having fewer than 2 bowel movements per week. Usually, the stools are hard. As we grow older, constipation is more common. If you try to fix constipation with laxatives, the problem may get worse. This is because laxatives taken over a long period of time make the colon muscles weaker. A low-fiber diet, not taking in enough fluids, and taking some medicines may make these problems worse.  HOME CARE INSTRUCTIONS  Constipation is usually best cared for without  medicines. Increasing dietary fiber and eating more fruits and vegetables is the best way to manage constipation.   Slowly increase fiber intake to 25 to 38 grams per day. Whole grains, fruits, vegetables, and legumes are good sources of fiber. A dietitian can further help you incorporate high-fiber foods into your diet.   Drink enough water and fluids to keep your urine clear or pale yellow.   A fiber supplement may be added to your diet if you cannot get enough fiber from foods.   Increasing your activities also helps improve regularity.     Stronger measures, such as magnesium sulfate, should be avoided if possible. This may cause uncontrollable diarrhea. Using magnesium sulfate may not allow you time to make it to the bathroom.    High-Fiber Diet A high-fiber diet changes your normal diet to include more whole grains, legumes, fruits, and vegetables. Changes in the diet involve replacing refined carbohydrates with unrefined foods. The calorie level of the diet is essentially unchanged. The Dietary Reference Intake (recommended amount) for adult males is 38 grams per day. For adult females, it is 25 grams per day. Pregnant and lactating women should consume 28 grams of fiber per day. Fiber is the intact part of a plant that is not broken down during digestion. Functional fiber is fiber that has been isolated from the plant to provide a beneficial effect in the body. PURPOSE  Increase stool bulk.   Ease and regulate bowel movements.   Lower cholesterol.  REDUCE RISK OF COLON CANCER  INDICATIONS THAT YOU NEED MORE FIBER  Constipation and hemorrhoids.   Uncomplicated diverticulosis (intestine condition) and irritable bowel syndrome.   Weight management.   As a protective measure against hardening of the arteries (atherosclerosis), diabetes, and cancer.   GUIDELINES FOR INCREASING FIBER IN THE DIET  Start adding fiber to the diet slowly. A gradual increase of about 5 more grams (2 slices of whole-wheat bread, 2 servings of most fruits or vegetables, or 1 bowl of high-fiber cereal) per day is best. Too rapid an increase in fiber may result in constipation, flatulence, and bloating.   Drink enough water and fluids to keep your urine clear or pale yellow. Water, juice, or caffeine-free drinks are recommended. Not drinking enough fluid may cause constipation.   Eat a variety of high-fiber foods rather than one type of fiber.   Try to increase your intake of fiber through using high-fiber foods rather than fiber pills or  supplements that contain small amounts of fiber.   The goal is to change the types of food eaten. Do not supplement your present diet with high-fiber foods, but replace foods in your present diet.   INCLUDE A VARIETY OF FIBER SOURCES  Replace refined and processed grains with whole grains, canned fruits with fresh fruits, and incorporate other fiber sources. White rice, white breads, and most bakery goods contain little or no fiber.   Brown whole-grain rice, buckwheat oats, and many fruits and vegetables are all good sources of fiber. These include: broccoli, Brussels sprouts, cabbage, cauliflower, beets, sweet potatoes, white potatoes (skin on), carrots, tomatoes, eggplant, squash, berries, fresh fruits, and dried fruits.   Cereals appear to be the richest source of fiber. Cereal fiber is found in whole grains and bran. Bran is the fiber-rich outer coat of cereal grain, which is largely removed in refining. In whole-grain cereals, the bran remains. In breakfast cereals, the largest amount of fiber is found in those with "bran" in their names. The  fiber content is sometimes indicated on the label.   You may need to include additional fruits and vegetables each day.   In baking, for 1 cup white flour, you may use the following substitutions:   1 cup whole-wheat flour minus 2 tablespoons.   1/2 cup white flour plus 1/2 cup whole-wheat flour.   Polyps, Colon  A polyp is extra tissue that grows inside your body. Colon polyps grow in the large intestine. The large intestine, also called the colon, is part of your digestive system. It is a long, hollow tube at the end of your digestive tract where your body makes and stores stool. Most polyps are not dangerous. They are benign. This means they are not cancerous. But over time, some types of polyps can turn into cancer. Polyps that are smaller than a pea are usually not harmful. But larger polyps could someday become or may already be cancerous. To be  safe, doctors remove all polyps and test them.   WHO GETS POLYPS? Anyone can get polyps, but certain people are more likely than others. You may have a greater chance of getting polyps if:  You are over 50.   You have had polyps before.   Someone in your family has had polyps.   Someone in your family has had cancer of the large intestine.   Find out if someone in your family has had polyps. You may also be more likely to get polyps if you:   Eat a lot of fatty foods   Smoke   Drink alcohol   Do not exercise  Eat too much   TREATMENT  The caregiver will remove the polyp during sigmoidoscopy or colonoscopy.    PREVENTION There is not one sure way to prevent polyps. You might be able to lower your risk of getting them if you:  Eat more fruits and vegetables and less fatty food.   Do not smoke.   Avoid alcohol.   Exercise every day.   Lose weight if you are overweight.   Eating more calcium and folate can also lower your risk of getting polyps. Some foods that are rich in calcium are milk, cheese, and broccoli. Some foods that are rich in folate are chickpeas, kidney beans, and spinach.   Hemorrhoids Hemorrhoids are dilated (enlarged) veins around the rectum. Sometimes clots will form in the veins. This makes them swollen and painful. These are called thrombosed hemorrhoids. Causes of hemorrhoids include:  Constipation.   Straining to have a bowel movement.   HEAVY LIFTING  HOME CARE INSTRUCTIONS  Eat a well balanced diet and drink 6 to 8 glasses of water every day to avoid constipation. You may also use a bulk laxative.   Avoid straining to have bowel movements.   Keep anal area dry and clean.   Do not use a donut shaped pillow or sit on the toilet for long periods. This increases blood pooling and pain.   Move your bowels when your body has the urge; this will require less straining and will decrease pain and pressure.

## 2015-06-23 NOTE — Transfer of Care (Signed)
Immediate Anesthesia Transfer of Care Note  Patient: Becky [name]  Procedure(s) Performed: Procedure(s) with comments: COLONOSCOPY WITH PROPOFOL (N/A) - Cecum time in 1411 time out 1429   total time 18 minutes POLYPECTOMY (N/A) - descending colon  Patient Location: PACU  Anesthesia Type:MAC  Level of Consciousness: awake, alert , oriented and patient cooperative  Airway & Oxygen Therapy: Patient Spontanous Breathing and Patient connected to face mask oxygen  Post-op Assessment: Report given to RN, Post -op Vital signs reviewed and stable and Patient moving all extremities  Post vital signs: Reviewed and stable  Last Vitals:  Filed Vitals:   06/23/15 1330  BP: 120/84  Temp:   Resp: 14    Complications: No apparent anesthesia complications

## 2015-06-23 NOTE — Anesthesia Postprocedure Evaluation (Signed)
  Anesthesia Post-op Note  Patient: Becky [name]  Procedure(s) Performed: Procedure(s) with comments: COLONOSCOPY WITH PROPOFOL (N/A) - Cecum time in 1411 time out 1429   total time 18 minutes POLYPECTOMY (N/A) - descending colon  Patient Location: PACU  Anesthesia Type:MAC  Level of Consciousness: awake, alert , oriented and patient cooperative  Airway and Oxygen Therapy: Patient Spontanous Breathing  Post-op Pain: none  Post-op Assessment: Post-op Vital signs reviewed, Patient's Cardiovascular Status Stable, Respiratory Function Stable, Patent Airway, No signs of Nausea or vomiting and Pain level controlled              Post-op Vital Signs: Reviewed and stable  Last Vitals:  Filed Vitals:   06/23/15 1504  BP: 115/82  Pulse: 50  Temp: 36.4 C  Resp: 20    Complications: No apparent anesthesia complications

## 2015-06-23 NOTE — Anesthesia Preprocedure Evaluation (Signed)
Anesthesia Evaluation  Patient identified by MRN, date of birth, ID band Patient awake    Reviewed: Allergy & Precautions, NPO status , Patient's Chart, lab work & pertinent test results  Airway Mallampati: II  TM Distance: >3 FB     Dental  (+) Poor Dentition, Loose,    Pulmonary neg pulmonary ROS,  breath sounds clear to auscultation        Cardiovascular hypertension, Pt. on medications Rhythm:Regular Rate:Normal     Neuro/Psych  Headaches, PSYCHIATRIC DISORDERS Depression    GI/Hepatic (+)     substance abuse (screen neg today)  cocaine use,   Endo/Other    Renal/GU      Musculoskeletal   Abdominal   Peds  Hematology   Anesthesia Other Findings   Reproductive/Obstetrics                             Anesthesia Physical Anesthesia Plan  ASA: III  Anesthesia Plan: MAC   Post-op Pain Management:    Induction: Intravenous  Airway Management Planned: Simple Face Mask  Additional Equipment:   Intra-op Plan:   Post-operative Plan:   Informed Consent: I have reviewed the patients History and Physical, chart, labs and discussed the procedure including the risks, benefits and alternatives for the proposed anesthesia with the patient or authorized representative who has indicated his/her understanding and acceptance.     Plan Discussed with:   Anesthesia Plan Comments:         Anesthesia Quick Evaluation

## 2015-06-23 NOTE — Addendum Note (Signed)
Addendum  created 06/23/15 1710 by Charmaine Downs, CRNA   Modules edited: Notes Section   Notes Section:  File: [id number]

## 2015-06-23 NOTE — Progress Notes (Signed)
REVIEWED-NO ADDITIONAL RECOMMENDATIONS. 

## 2015-06-23 NOTE — Anesthesia Postprocedure Evaluation (Signed)
  Anesthesia Post-op Note  Patient: Becky [name]  Procedure(s) Performed: Procedure(s) with comments: COLONOSCOPY WITH PROPOFOL (N/A) - Cecum time in 1411 time out 1429   total time 18 minutes POLYPECTOMY (N/A) - descending colon  Patient Location: PACU  Anesthesia Type:MAC  Level of Consciousness: awake, alert , oriented and patient cooperative  Airway and Oxygen Therapy: Patient Spontanous Breathing  Post-op Pain: none  Post-op Assessment: Post-op Vital signs reviewed, Patient's Cardiovascular Status Stable, Respiratory Function Stable, Patent Airway, No signs of Nausea or vomiting and Pain level controlled              Post-op Vital Signs: Reviewed and stable  Last Vitals:  Filed Vitals:   06/23/15 1445  BP: 107/72  Pulse: 59  Temp:   Resp: 16    Complications: No apparent anesthesia complications

## 2015-06-24 ENCOUNTER — Encounter (HOSPITAL_COMMUNITY): Payer: Self-pay | Admitting: Gastroenterology

## 2015-07-02 ENCOUNTER — Telehealth: Payer: Self-pay | Admitting: Gastroenterology

## 2015-07-02 NOTE — Telephone Encounter (Signed)
Please call pt. SHE had simple adenomas removed.   AVOID CONSTIPATION.   DRINK WATER TO KEEP YOUR URINE LIGHT YELLOW.  FOLLOW A HIGH FIBER DIET. AVOID ITEMS THAT CAUSE BLOATING & GAS. SEE INFO BELOW.  CONTINUE LINZESS.  SEE SURGEON FOR HEMORRHOIDECTOMY. LET us KNOW WHERE YOU WANT TO BE REFERRED.  FOLLOW UP IN 4 MOS E30 CONSTIPATION/HEMORHROIDS.  Next colonoscopy in 3 years. YOUR SISTERS, BROTHERS, CHILDREN, AND PARENTS NEED TO HAVE A COLONOSCOPY STARTING AT THE AGE OF 40.

## 2015-07-02 NOTE — Telephone Encounter (Signed)
Called pt  Unable to leave message

## 2015-07-02 NOTE — Telephone Encounter (Signed)
ON RECALL  °

## 2015-07-06 NOTE — Telephone Encounter (Signed)
Letter mailed to pt to call.  

## 2015-07-08 ENCOUNTER — Other Ambulatory Visit: Payer: Self-pay

## 2015-07-08 DIAGNOSIS — K649 Unspecified hemorrhoids: Secondary | ICD-10-CM

## 2015-07-08 NOTE — Telephone Encounter (Signed)
Referral made 

## 2015-07-08 NOTE — Telephone Encounter (Signed)
Pt called office back and she is aware of her results. States that it is ok to refer her any surgeon for her hemorrhoid surgery.

## 2015-07-14 ENCOUNTER — Encounter (HOSPITAL_COMMUNITY): Payer: Self-pay | Admitting: Emergency Medicine

## 2015-07-14 ENCOUNTER — Emergency Department (HOSPITAL_COMMUNITY)
Admission: EM | Admit: 2015-07-14 | Discharge: 2015-07-14 | Disposition: A | Discharge status: Home / Self Care | Payer: Medicaid Other | Attending: Emergency Medicine | Admitting: Emergency Medicine

## 2015-07-14 DIAGNOSIS — I10 Essential (primary) hypertension: Secondary | ICD-10-CM | POA: Diagnosis not present

## 2015-07-14 DIAGNOSIS — F322 Major depressive disorder, single episode, severe without psychotic features: Secondary | ICD-10-CM | POA: Diagnosis not present

## 2015-07-14 DIAGNOSIS — J9801 Acute bronchospasm: Secondary | ICD-10-CM

## 2015-07-14 DIAGNOSIS — R05 Cough: Secondary | ICD-10-CM | POA: Diagnosis present

## 2015-07-14 DIAGNOSIS — R51 Headache: Secondary | ICD-10-CM | POA: Diagnosis not present

## 2015-07-14 DIAGNOSIS — Z79899 Other long term (current) drug therapy: Secondary | ICD-10-CM | POA: Insufficient documentation

## 2015-07-14 MED ORDER — ACETAMINOPHEN 500 MG PO TABS
1000.0000 mg | ORAL_TABLET | Freq: Once | ORAL | Status: AC
  Administered 2015-07-14: 1000 mg via ORAL
  Filled 2015-07-14: qty 2

## 2015-07-14 NOTE — ED Notes (Signed)
Pt sprayed bug spray outside and c/o headache, nose pain, and cough.

## 2015-07-14 NOTE — Discharge Instructions (Signed)
Use your inhaler as needed for any wheezing. Tylenol for headaches as needed.   Bronchospasm A bronchospasm is a spasm or tightening of the airways going into the lungs. During a bronchospasm breathing becomes more difficult because the airways get smaller. When this happens there can be coughing, a whistling sound when breathing (wheezing), and difficulty breathing. Bronchospasm is often associated with asthma, but not all patients who experience a bronchospasm have asthma. CAUSES  A bronchospasm is caused by inflammation or irritation of the airways. The inflammation or irritation may be triggered by:   Allergies (such as to animals, pollen, food, or mold). Allergens that cause bronchospasm may cause wheezing immediately after exposure or many hours later.   Infection. Viral infections are believed to be the most common cause of bronchospasm.   Exercise.   Irritants (such as pollution, cigarette smoke, strong odors, aerosol sprays, and paint fumes).   Weather changes. Winds increase molds and pollens in the air. Rain refreshes the air by washing irritants out. Cold air may cause inflammation.   Stress and emotional upset.  SIGNS AND SYMPTOMS   Wheezing.   Excessive nighttime coughing.   Frequent or severe coughing with a simple cold.   Chest tightness.   Shortness of breath.  DIAGNOSIS  Bronchospasm is usually diagnosed through a history and physical exam. Tests, such as chest X-rays, are sometimes done to look for other conditions. TREATMENT   Inhaled medicines can be given to open up your airways and help you breathe. The medicines can be given using either an inhaler or a nebulizer machine.  Corticosteroid medicines may be given for severe bronchospasm, usually when it is associated with asthma. HOME CARE INSTRUCTIONS   Always have a plan prepared for seeking medical care. Know when to call your health care provider and local emergency services (911 in the  [place] Know where you can access local emergency care.  Only take medicines as directed by your health care provider.  If you were prescribed an inhaler or nebulizer machine, ask your health care provider to explain how to use it correctly. Always use a spacer with your inhaler if you were given one.  It is necessary to remain calm during an attack. Try to relax and breathe more slowly.  Control your home environment in the following ways:   Change your heating and air conditioning filter at least once a month.   Limit your use of fireplaces and wood stoves.  Do not smoke and do not allow smoking in your home.   Avoid exposure to perfumes and fragrances.   Get rid of pests (such as roaches and mice) and their droppings.   Throw away plants if you see mold on them.   Keep your house clean and dust free.   Replace carpet with wood, tile, or vinyl flooring. Carpet can trap dander and dust.   Use allergy-proof pillows, mattress covers, and box spring covers.   Wash bed sheets and blankets every week in hot water and dry them in a dryer.   Use blankets that are made of polyester or cotton.   Wash hands frequently. SEEK MEDICAL CARE IF:   You have muscle aches.   You have chest pain.   The sputum changes from clear or white to yellow, green, gray, or bloody.   The sputum you cough up gets thicker.   There are problems that may be related to the medicine you are given, such as a rash, itching, swelling, or trouble breathing.  SEEK IMMEDIATE MEDICAL CARE IF:   You have worsening wheezing and coughing even after taking your prescribed medicines.   You have increased difficulty breathing.   You develop severe chest pain. MAKE SURE YOU:   Understand these instructions.  Will watch your condition.  Will get help right away if you are not doing well or get worse. Document Released: 11/10/2003 Document Revised: 11/12/2013 Document Reviewed:  04/29/2013 [facility] Patient Information 2015 [place], [place]. This information is not intended to replace advice given to you by your health care provider. Make sure you discuss any questions you have with your health care provider.

## 2015-07-14 NOTE — Patient Instructions (Signed)
Becky [name]  07/14/2015     @PREFPERIOPPHARMACY @   Your procedure is scheduled on   07/20/2015  Report to [facility] at  730  A.M.  Call this number if you have problems the morning of surgery:  [phone]   Remember:  Do not eat food or drink liquids after midnight.  Take these medicines the morning of surgery with A SIP OF WATER: zoloft   Do not wear jewelry, make-up or nail polish.  Do not wear lotions, powders, or perfumes.    Do not shave 48 hours prior to surgery.  Men may shave face and neck.  Do not bring valuables to the hospital.  [facility] is not responsible for any belongings or valuables.  Contacts, dentures or bridgework may not be worn into surgery.  Leave your suitcase in the car.  After surgery it may be brought to your room.  For patients admitted to the hospital, discharge time will be determined by your treatment team.  Patients discharged the day of surgery will not be allowed to drive home.   Name and phone number of your driver:   family Special instructions:  none  Please read over the following fact sheets that you were given. Pain Booklet, Coughing and Deep Breathing, Surgical Site Infection Prevention, Anesthesia Post-op Instructions and Care and Recovery After Surgery      Hernia A hernia occurs when an internal organ pushes out through a weak spot in the abdominal wall. Hernias most commonly occur in the groin and around the navel. Hernias often can be pushed back into place (reduced). Most hernias tend to get worse over time. Some abdominal hernias can get stuck in the opening (irreducible or incarcerated hernia) and cannot be reduced. An irreducible abdominal hernia which is tightly squeezed into the opening is at risk for impaired blood supply (strangulated hernia). A strangulated hernia is a medical emergency. Because of the risk for an irreducible or strangulated hernia, surgery may be recommended to repair a hernia. CAUSES    Heavy lifting.  Prolonged coughing.  Straining to have a bowel movement.  A cut (incision) made during an abdominal surgery. HOME CARE INSTRUCTIONS   Bed rest is not required. You may continue your normal activities.  Avoid lifting more than 10 pounds (4.5 kg) or straining.  Cough gently. If you are a smoker it is best to stop. Even the best hernia repair can break down with the continual strain of coughing. Even if you do not have your hernia repaired, a cough will continue to aggravate the problem.  Do not wear anything tight over your hernia. Do not try to keep it in with an outside bandage or truss. These can damage abdominal contents if they are trapped within the hernia sac.  Eat a normal diet.  Avoid constipation. Straining over long periods of time will increase hernia size and encourage breakdown of repairs. If you cannot do this with diet alone, stool softeners may be used. SEEK IMMEDIATE MEDICAL CARE IF:   You have a fever.  You develop increasing abdominal pain.  You feel nauseous or vomit.  Your hernia is stuck outside the abdomen, looks discolored, feels hard, or is tender.  You have any changes in your bowel habits or in the hernia that are unusual for you.  You have increased pain or swelling around the hernia.  You cannot push the hernia back in place by applying gentle pressure while lying down. MAKE  SURE YOU:   Understand these instructions.  Will watch your condition.  Will get help right away if you are not doing well or get worse. Document Released: 11/07/2005 Document Revised: 01/30/2012 Document Reviewed: 06/26/2008 [facility] Patient Information 2015 [place], [place]. This information is not intended to replace advice given to you by your health care provider. Make sure you discuss any questions you have with your health care provider. PATIENT INSTRUCTIONS POST-ANESTHESIA  IMMEDIATELY FOLLOWING SURGERY:  Do not drive or operate machinery for  the first twenty four hours after surgery.  Do not make any important decisions for twenty four hours after surgery or while taking narcotic pain medications or sedatives.  If you develop intractable nausea and vomiting or a severe headache please notify your doctor immediately.  FOLLOW-UP:  Please make an appointment with your surgeon as instructed. You do not need to follow up with anesthesia unless specifically instructed to do so.  WOUND CARE INSTRUCTIONS (if applicable):  Keep a dry clean dressing on the anesthesia/puncture wound site if there is drainage.  Once the wound has quit draining you may leave it open to air.  Generally you should leave the bandage intact for twenty four hours unless there is drainage.  If the epidural site drains for more than 36-48 hours please call the anesthesia department.  QUESTIONS?:  Please feel free to call your physician or the hospital operator if you have any questions, and they will be happy to assist you.

## 2015-07-15 ENCOUNTER — Encounter (HOSPITAL_COMMUNITY)
Admission: RE | Admit: 2015-07-15 | Discharge: 2015-07-15 | Disposition: A | Discharge status: Home / Self Care | Payer: Medicaid Other | Source: Ambulatory Visit | Attending: General Surgery | Admitting: General Surgery

## 2015-07-15 NOTE — H&P (Signed)
  NTS SOAP Note  Vital Signs:  Vitals as of: 9/48/0165: Systolic 537: Diastolic 482: Heart Rate 64: Temp 97.36F: Height 74ft 9in: Weight 164Lbs 0 Ounces: Pain Level 7: BMI 24.22  BMI : 24.22 kg/m2  Subjective: This 54 year old female presents for of a ventral hernia.  Has been present for some time, made worse with straining.  Is starting to stick out more and hurt.  Review of Symptoms:  Constitutional:fatigue headache Eyes:blurred vision bilateral, pain bilateral sinus problems Cardiovascular:  unremarkable Respiratory:dyspnea Gastrointestinabdominal pain Genitourinary:frequency joint, neck, and back pain Skin:unremarkable Hematolgic/Lymphatic:unremarkable   Allergic/Immunologic:unremarkable   Past Medical History:  Reviewed  Past Medical History  Medical Problems: anxiety Allergies: nkda Medications: zoloft   Social History:Reviewed  Social History  Preferred Language: English Race:  Black or African American Ethnicity: Not Hispanic / Latino Age: 51 year Marital Status:  S Alcohol: 3 beers a day   Smoking Status: Light tobacco smoker reviewed on 07/14/2015 Started Date:  Packs per week:  Functional Status reviewed on 07/14/2015 ------------------------------------------------ Bathing: Normal Cooking: Normal Dressing: Normal Driving: Normal Eating: Normal Managing Meds: Normal Oral Care: Normal Shopping: Normal Toileting: Normal Transferring: Normal Walking: Normal Cognitive Status reviewed on 07/14/2015 ------------------------------------------------ Attention: Normal Decision Making: Normal Language: Normal Memory: Normal Motor: Normal Perception: Normal Problem Solving: Normal Visual and Spatial: Normal   Family History:Reviewed  Family Health History Mother  Father, Living; Heart disease;     Objective Information: General:Well appearing, well nourished in no distress. Heart:RRR, no murmur Lungs:  CTA  bilaterally, no wheezes, rhonchi, rales.  Breathing unlabored. Abdomen:Soft, NT/ND, no HSM, no masses.  Has reducible ventral hernia just superior to umbilicus, diastasis rectii present.  Assessment:Ventral hernia  Diagnoses: 553.8  K43.9 Hernia of anterior abdominal wall without obstruction AND without gangrene (Ventral hernia without obstruction or gangrene)  Procedures: 70786 - OFFICE OUTPATIENT NEW 30 MINUTES    Plan:  Scheduled for ventral herniorrhaphy with mesh on 07/20/15.   Patient Education:Alternative treatments to surgery were discussed with patient (and family).  Risks and benefits  of procedure incluidng bleeding, infection, mesh use, and recurrence of the hernia were fully explained to the patient (and family) who gave informed consent. Patient/family questions were addressed.  Follow-up:Pending Surgery

## 2015-07-17 ENCOUNTER — Encounter (HOSPITAL_COMMUNITY): Admission: RE | Admit: 2015-07-17 | Payer: Medicaid Other | Source: Ambulatory Visit

## 2015-07-17 NOTE — ED Provider Notes (Signed)
CSN: [id number]     Arrival date & time 07/14/15  2200 History   First MD Initiated Contact with Patient 07/14/15 2212     Chief Complaint  Patient presents with  . Toxic Inhalation      HPI  Patient presents for evaluation after some coughing and wheezing after spraying some bug spray outside. Had some "wasp and spider spray coaches bring him outside. Felt she inhaled some. Stomach she is fairly anxious. Started coughing or wheezing. This is resolved. Complained of a mild headache also states this is resolved.  Past Medical History  Diagnosis Date  . Hypertension   . Bronchitis   . Headache   . Depression   . Cocaine abuse   . MDD (major depressive disorder)    Past Surgical History  Procedure Laterality Date  . Cysts removed from armpits bilaterally    . Colonoscopy with propofol N/A 06/23/2015    Procedure: COLONOSCOPY WITH PROPOFOL;  Surgeon: Danie Binder, MD;  Location: [facility];  Service: Endoscopy;  Laterality: N/A;  Cecum time in 1411 time out 1429   total time 18 minutes  . Polypectomy N/A 06/23/2015    Procedure: POLYPECTOMY;  Surgeon: Danie Binder, MD;  Location: [facility];  Service: Endoscopy;  Laterality: N/A;  descending colon   Family History  Problem Relation Age of Onset  . Heart failure Mother   . Coronary artery disease Mother   . Heart failure Father   . Coronary artery disease Father   . Colon cancer Neg Hx    Social History  Substance Use Topics  . Smoking status: Never Smoker   . Smokeless tobacco: Never Used     Comment: vague reports of smoking intermittently   . Alcohol Use: Yes     Comment: 40oz beer daily    OB History    Gravida Para Term Preterm AB TAB SAB Ectopic Multiple Living   5 3 2 1 2  2         Review of Systems  Constitutional: Negative for fever, chills, diaphoresis, appetite change and fatigue.  HENT: Negative for mouth sores, sore throat and trouble swallowing.   Eyes: Negative for visual disturbance.  Respiratory: Positive  for cough and wheezing. Negative for chest tightness and shortness of breath.   Cardiovascular: Negative for chest pain.  Gastrointestinal: Negative for nausea, vomiting, abdominal pain, diarrhea and abdominal distention.  Endocrine: Negative for polydipsia, polyphagia and polyuria.  Genitourinary: Negative for dysuria, frequency and hematuria.  Musculoskeletal: Negative for gait problem.  Skin: Negative for color change, pallor and rash.  Neurological: Positive for headaches. Negative for dizziness, syncope and light-headedness.  Hematological: Does not bruise/bleed easily.  Psychiatric/Behavioral: Negative for behavioral problems and confusion.      Allergies  Review of patient's allergies indicates no known allergies.  Home Medications   Prior to Admission medications   Medication Sig Start Date End Date Taking? Authorizing Provider  ibuprofen (ADVIL,MOTRIN) 200 MG tablet Take 200 mg by mouth every 6 (six) hours as needed for moderate pain.    Historical Provider, MD  Linaclotide Rolan Lipa) 145 MCG CAPS capsule Take 1 capsule (145 mcg total) by mouth daily. 30 minutes before breakfast 06/08/15   Orvil Feil, NP  sertraline (ZOLOFT) 50 MG tablet Take 1 tablet (50 mg total) by mouth daily. 02/14/15   Ruben Im, PA-C  traZODone (DESYREL) 50 MG tablet Take 1 tablet (50 mg total) by mouth at bedtime as needed for sleep. 02/14/15  Milta Deiters T Mashburn, PA-C   BP 157/112 mmHg  Pulse 71  Temp(Src) 97.7 F (36.5 C) (Oral)  Resp 24  Ht 5\' 9"  (1.753 m)  Wt 164 lb (74.39 kg)  BMI 24.21 kg/m2  SpO2 100% Physical Exam  Constitutional: [name], place, and time. She appears well-developed and well-nourished. No distress.  HENT:  Head: Normocephalic.  Eyes: Conjunctivae are normal. Pupils are equal, round, and reactive to light. No scleral icterus.  Neck: Normal range of motion. Neck supple. No thyromegaly present.  Cardiovascular: Normal rate and regular rhythm.  Exam  reveals no gallop and no friction rub.   No murmur heard. Pulmonary/Chest: Effort normal and breath sounds normal. No respiratory distress. She has no wheezes. She has no rales.  Abdominal: Soft. Bowel sounds are normal. She exhibits no distension. There is no tenderness. There is no rebound.  Musculoskeletal: Normal range of motion.  Neurological: She is alert and oriented to person, place, and time.  Skin: Skin is warm and dry. No rash noted.  Psychiatric: She has a normal mood and affect. Her behavior is normal.    ED Course  Procedures (including critical care time) Labs Review Labs Reviewed - No data to display  Imaging Review No results found. I have personally reviewed and evaluated these images and lab results as part of my medical decision-making.   EKG Interpretation None      MDM   Final diagnoses:  Bronchospasm    Asymptomatic here. Appropriate for discharge.    Tanna Furry, MD 07/17/15 [phone]

## 2015-07-20 ENCOUNTER — Encounter (HOSPITAL_COMMUNITY): Admission: RE | Payer: Self-pay | Source: Ambulatory Visit

## 2015-07-20 ENCOUNTER — Ambulatory Visit (HOSPITAL_COMMUNITY): Admission: RE | Admit: 2015-07-20 | Payer: Medicaid Other | Source: Ambulatory Visit | Admitting: General Surgery

## 2015-07-20 SURGERY — REPAIR, HERNIA, VENTRAL
Anesthesia: General

## 2015-08-27 ENCOUNTER — Encounter (HOSPITAL_COMMUNITY): Payer: Self-pay | Admitting: *Deleted

## 2015-08-27 ENCOUNTER — Emergency Department (HOSPITAL_COMMUNITY): Payer: Medicaid Other

## 2015-08-27 ENCOUNTER — Emergency Department (HOSPITAL_COMMUNITY)
Admission: EM | Admit: 2015-08-27 | Discharge: 2015-08-27 | Disposition: A | Discharge status: Home / Self Care | Payer: Medicaid Other | Attending: Emergency Medicine | Admitting: Emergency Medicine

## 2015-08-27 DIAGNOSIS — R0602 Shortness of breath: Secondary | ICD-10-CM | POA: Diagnosis present

## 2015-08-27 DIAGNOSIS — Z79899 Other long term (current) drug therapy: Secondary | ICD-10-CM | POA: Diagnosis not present

## 2015-08-27 DIAGNOSIS — I10 Essential (primary) hypertension: Secondary | ICD-10-CM | POA: Diagnosis not present

## 2015-08-27 DIAGNOSIS — F329 Major depressive disorder, single episode, unspecified: Secondary | ICD-10-CM | POA: Insufficient documentation

## 2015-08-27 DIAGNOSIS — J441 Chronic obstructive pulmonary disease with (acute) exacerbation: Secondary | ICD-10-CM | POA: Diagnosis not present

## 2015-08-27 DIAGNOSIS — Z720 Tobacco use: Secondary | ICD-10-CM | POA: Insufficient documentation

## 2015-08-27 LAB — BASIC METABOLIC PANEL
ANION GAP: 11 (ref 5–15)
BUN: 22 mg/dL — ABNORMAL HIGH (ref 6–20)
CALCIUM: 9.6 mg/dL (ref 8.9–10.3)
CHLORIDE: 109 mmol/L (ref 101–111)
CO2: 22 mmol/L (ref 22–32)
CREATININE: 1.25 mg/dL — AB (ref 0.44–1.00)
GFR calc Af Amer: 56 mL/min — ABNORMAL LOW (ref >60)
GFR, EST NON AFRICAN AMERICAN: 48 mL/min — AB (ref >60)
Glucose, Bld: 145 mg/dL — ABNORMAL HIGH (ref 65–99)
POTASSIUM: 3.9 mmol/L (ref 3.5–5.1)
SODIUM: 142 mmol/L (ref 135–145)

## 2015-08-27 LAB — I-STAT TROPONIN, ED: Troponin i, poc: 0 ng/mL (ref 0.00–0.08)

## 2015-08-27 LAB — CBC
HCT: 45.1 % (ref 36.0–46.0)
HEMOGLOBIN: 15 g/dL (ref 12.0–15.0)
MCH: 29.3 pg (ref 26.0–34.0)
MCHC: 33.3 g/dL (ref 30.0–36.0)
MCV: 88.1 fL (ref 78.0–100.0)
Platelets: 200 10*3/uL (ref 150–400)
RBC: 5.12 MIL/uL — AB (ref 3.87–5.11)
RDW: 13.8 % (ref 11.5–15.5)
WBC: 4.5 10*3/uL (ref 4.0–10.5)

## 2015-08-27 MED ORDER — ALBUTEROL SULFATE (2.5 MG/3ML) 0.083% IN NEBU
2.5000 mg | INHALATION_SOLUTION | Freq: Once | RESPIRATORY_TRACT | Status: AC
  Administered 2015-08-27: 2.5 mg via RESPIRATORY_TRACT
  Filled 2015-08-27: qty 3

## 2015-08-27 MED ORDER — ALBUTEROL SULFATE HFA 108 (90 BASE) MCG/ACT IN AERS
2.0000 | INHALATION_SPRAY | Freq: Once | RESPIRATORY_TRACT | Status: AC
  Administered 2015-08-27: 2 via RESPIRATORY_TRACT
  Filled 2015-08-27: qty 6.7

## 2015-08-27 MED ORDER — ALBUTEROL SULFATE (2.5 MG/3ML) 0.083% IN NEBU
5.0000 mg | INHALATION_SOLUTION | Freq: Once | RESPIRATORY_TRACT | Status: AC
  Administered 2015-08-27: 5 mg via RESPIRATORY_TRACT
  Filled 2015-08-27: qty 6

## 2015-08-27 MED ORDER — AZITHROMYCIN 250 MG PO TABS: 250.0000 mg | ORAL_TABLET | Freq: Every day | ORAL | Status: DC

## 2015-08-27 MED ORDER — ALBUTEROL SULFATE (2.5 MG/3ML) 0.083% IN NEBU
5.0000 mg | INHALATION_SOLUTION | RESPIRATORY_TRACT | Status: AC
  Administered 2015-08-27 (×2): 5 mg via RESPIRATORY_TRACT
  Filled 2015-08-27 (×2): qty 6

## 2015-08-27 MED ORDER — ALBUTEROL SULFATE HFA 108 (90 BASE) MCG/ACT IN AERS: 2.0000 | INHALATION_SPRAY | RESPIRATORY_TRACT | Status: DC | PRN

## 2015-08-27 MED ORDER — AZITHROMYCIN 250 MG PO TABS
500.0000 mg | ORAL_TABLET | Freq: Once | ORAL | Status: AC
  Administered 2015-08-27: 500 mg via ORAL
  Filled 2015-08-27: qty 2

## 2015-08-27 MED ORDER — SODIUM CHLORIDE 0.9 % IV BOLUS (SEPSIS)
1000.0000 mL | Freq: Once | INTRAVENOUS | Status: AC
  Administered 2015-08-27: 1000 mL via INTRAVENOUS

## 2015-08-27 MED ORDER — PREDNISONE 10 MG PO TABS: 40.0000 mg | ORAL_TABLET | Freq: Every day | ORAL | Status: DC

## 2015-08-27 MED ORDER — PREDNISONE 50 MG PO TABS
50.0000 mg | ORAL_TABLET | Freq: Every day | ORAL | Status: DC
  Administered 2015-08-27: 50 mg via ORAL
  Filled 2015-08-27: qty 1

## 2015-08-27 MED ORDER — IPRATROPIUM BROMIDE 0.02 % IN SOLN
0.5000 mg | Freq: Once | RESPIRATORY_TRACT | Status: AC
  Administered 2015-08-27: 0.5 mg via RESPIRATORY_TRACT
  Filled 2015-08-27: qty 2.5

## 2015-08-27 NOTE — ED Provider Notes (Signed)
CSN: [id number]     Arrival date & time 08/27/15  1128 History   First MD Initiated Contact with Patient 08/27/15 1151     Chief Complaint  Patient presents with  . Shortness of Breath     (Consider location/radiation/quality/duration/timing/severity/associated sxs/prior Treatment) HPI 54 year old female with history of COPD who presents with cough and sob. She currently continues to use tobacco, though reportedly having cut down significantly from before. Reports 1 week history of increasing cough, sputum production, and shortness of breath. Re-not over her inhaler last night and came to the ED for evaluation. Has not had fevers but has had chills. Describes tightness and burning in her chest associated with her cough. No lower extremity edema or pain, no recent immobilization, no history of blood clots or family history of blood clots. Last steroid use for COPD exacerbation was over a year ago and has not required hospitalization in the past.   Past Medical History  Diagnosis Date  . Hypertension   . Bronchitis   . Headache   . Depression   . Cocaine abuse   . MDD (major depressive disorder) [facility])    Past Surgical History  Procedure Laterality Date  . Cysts removed from armpits bilaterally    . Colonoscopy with propofol N/A 06/23/2015    Procedure: COLONOSCOPY WITH PROPOFOL;  Surgeon: Danie Binder, MD;  Location: [facility];  Service: Endoscopy;  Laterality: N/A;  Cecum time in 1411 time out 1429   total time 18 minutes  . Polypectomy N/A 06/23/2015    Procedure: POLYPECTOMY;  Surgeon: Danie Binder, MD;  Location: [facility];  Service: Endoscopy;  Laterality: N/A;  descending colon   Family History  Problem Relation Age of Onset  . Heart failure Mother   . Coronary artery disease Mother   . Heart failure Father   . Coronary artery disease Father   . Colon cancer Neg Hx    Social History  Substance Use Topics  . Smoking status: Never Smoker   . Smokeless tobacco: Never Used      Comment: vague reports of smoking intermittently   . Alcohol Use: Yes     Comment: 40oz beer daily    OB History    Gravida Para Term Preterm AB TAB SAB Ectopic Multiple Living   5 3 2 1 2  2         Review of Systems 10/14 systems reviewed and are negative other than those stated in the HPI    Allergies  Review of patient's allergies indicates no known allergies.  Home Medications   Prior to Admission medications   Medication Sig Start Date End Date Taking? Authorizing Provider  sertraline (ZOLOFT) 50 MG tablet Take 1 tablet (50 mg total) by mouth daily. 02/14/15  Yes Milta Deiters T Mashburn, PA-C  albuterol (PROVENTIL HFA;VENTOLIN HFA) 108 (90 BASE) MCG/ACT inhaler Inhale 2 puffs into the lungs every 4 (four) hours as needed for wheezing or shortness of breath. 08/27/15   Forde Dandy, MD  azithromycin (ZITHROMAX) 250 MG tablet Take 1 tablet (250 mg total) by mouth daily. Take one tablet daily starting 08/28/2015 08/28/15   Forde Dandy, MD  Linaclotide [facility]) 145 MCG CAPS capsule Take 1 capsule (145 mcg total) by mouth daily. 30 minutes before breakfast Patient not taking: Reported on 08/27/2015 06/08/15   Orvil Feil, NP  predniSONE (DELTASONE) 10 MG tablet Take 4 tablets (40 mg total) by mouth daily. 08/28/15   Forde Dandy, MD  traZODone (DESYREL) 50 MG tablet Take 1 tablet (50 mg total) by mouth at bedtime as needed for sleep. Patient not taking: Reported on 08/27/2015 02/14/15   Marlane Hatcher Mashburn, PA-C   BP 159/109 mmHg  Pulse 77  Temp(Src) 97.6 F (36.4 C) (Oral)  Resp 14  Ht 5\' 9"  (1.753 m)  Wt 164 lb (74.39 kg)  BMI 24.21 kg/m2  SpO2 92% Physical Exam Physical Exam  Nursing note and vitals reviewed. Constitutional: Well developed, well nourished, non-toxic, and mild respiratory distress Head: Normocephalic and atraumatic.  Mouth/Throat: Oropharynx is clear and moist.  Neck: Normal range of motion. Neck supple.  Cardiovascular: Normal rate and regular rhythm.  No edema.   Pulmonary/Chest: Bronchospastic cough, with expiratory wheezing, primarily in the lower lung fields bilaterally. Speaks in full sentences. Abdominal: Soft. There is no tenderness. There is no rebound and no guarding.  Musculoskeletal: Normal range of motion.  Neurological: Alert, no facial droop, fluent speech, moves all extremities symmetrically Skin: Skin is warm and dry.  Psychiatric: Cooperative  ED Course  Procedures (including critical care time) Labs Review Labs Reviewed  CBC - Abnormal; Notable for the following:    RBC 5.12 (*)    All other components within normal limits  BASIC METABOLIC PANEL - Abnormal; Notable for the following:    Glucose, Bld 145 (*)    BUN 22 (*)    Creatinine, Ser 1.25 (*)    GFR calc non Af Amer 48 (*)    GFR calc Af Amer 56 (*)    All other components within normal limits  I-STAT TROPOININ, ED    Imaging Review Dg Chest 2 View  08/27/2015   CLINICAL DATA:  Shortness of breath, cough  EXAM: CHEST  2 VIEW  COMPARISON:  02/12/2015  FINDINGS: The heart size and mediastinal contours are within normal limits. Both lungs are clear. The visualized skeletal structures are unremarkable.  IMPRESSION: No active cardiopulmonary disease.   Electronically Signed   By: Kathreen Devoid   On: 08/27/2015 13:15   I have personally reviewed and evaluated these images and lab results as part of my medical decision-making.   EKG Interpretation   Date/Time:  Thursday August 27 2015 11:41:43 EDT Ventricular Rate:  90 PR Interval:  115 QRS Duration: 87 QT Interval:  404 QTC Calculation: 494 R Axis:   101 Text Interpretation:  Sinus rhythm Borderline short PR interval Right axis  deviation Borderline prolonged QT interval No significant change since  last tracing Confirmed by Jazziel Fitzsimmons MD, Marise Knapper [phone]) on 08/27/2015 12:09:10 PM      MDM   Final diagnoses:  COPD exacerbation ([place])    54 year old female with history of COPD/chronic bronchitis with current tobacco  use who presents with one week of increasing cough and shortness of breath. With ambulation from triage to her ED room, she appears very dyspneic and initially does have conversational dyspnea. After a period of rest, she is comfortable, able to speak in full sentences, saturating normally on room air. She has mild tachypnea, with bronchospastic cough and expiratory wheezing in the lower lung fields on exam. Does not appear fluid overloaded no prior history of CAD or heart failure. CXR without infiltrate or other acute cardiopulmonary processes. EKG non-ischemic and troponin x 1 negative. Presentation low suspicion for ACS, given symptom resolution with treatment of COPD/chronic bronchitis.  Duoneb x 3 given with 50 mg prednisone and 500 mg azithromycin for acute COPD exacerbation. On re-evaluation, does have resolution of increased work of  breathing. Wheezing still noted and given additional albuterol treatment. On re-evaluation, she has clear lungs. She tolerates PO intake and ambulates without increased work of breathing or subjective sob. Felt appropriate for outpatient treatment. Given inhaler for home use and course of steroids and azithromycin. Strict return and follow-up instructions reviewed. She expressed understanding of all discharge instructions and felt comfortable with the plan of care.      Forde Dandy, MD 08/27/15 [phone]

## 2015-08-27 NOTE — Discharge Instructions (Signed)
Please avoid smoking during this acute exacerbation of your COPD. Take your albuterol inhaler every 4 hours scheduled for the first day. Then take it as needed. Take the rest of your medications as prescribed. Return for worsening symptoms including difficulty breathing, severe chest pain, or any other symptoms concerning to you.  Chronic Obstructive Pulmonary Disease Exacerbation Chronic obstructive pulmonary disease (COPD) is a common lung problem. In COPD, the flow of air from the lungs is limited. COPD exacerbations are times that breathing gets worse and you need extra treatment. Without treatment they can be life threatening. If they happen often, your lungs can become more damaged. If your COPD gets worse, your doctor may treat you with:  Medicines.  Oxygen.  Different ways to clear your airway, such as using a mask. HOME CARE  Do not smoke.  Avoid tobacco smoke and other things that bother your lungs.  If given, take your antibiotic medicine as told. Finish the medicine even if you start to feel better.  Only take medicines as told by your doctor.  Drink enough fluids to keep your pee (urine) clear or pale yellow (unless your doctor has told you not to).  Use a cool mist machine (vaporizer).  If you use oxygen or a machine that turns liquid medicine into a mist (nebulizer), continue to use them as told.  Keep up with shots (vaccinations) as told by your doctor.  Exercise regularly.  Eat healthy foods.  Keep all doctor visits as told. GET HELP RIGHT AWAY IF:  You are very short of breath and it gets worse.  You have trouble talking.  You have bad chest pain.  You have blood in your spit (sputum).  You have a fever.  You keep throwing up (vomiting).  You feel weak, or you pass out (faint).  You feel confused.  You keep getting worse. MAKE SURE YOU:  Understand these instructions.  Will watch your condition.  Will get help right away if you are not doing  well or get worse.   This information is not intended to replace advice given to you by your health care provider. Make sure you discuss any questions you have with your health care provider.   Document Released: 10/27/2011 Document Revised: 11/28/2014 Document Reviewed: 07/12/2013 Elsevier Interactive Patient Education [employer].

## 2015-08-27 NOTE — ED Notes (Signed)
Ambulated with CNA, tolerated well.  bp 120/70 HR 67 and Sats 90% on RA.

## 2015-08-27 NOTE — ED Notes (Signed)
Patient reports shortness of breath and generalized body aches. Wheezing noted in triage.

## 2015-09-20 ENCOUNTER — Emergency Department (HOSPITAL_COMMUNITY): Payer: Medicaid Other

## 2015-09-20 ENCOUNTER — Encounter (HOSPITAL_COMMUNITY): Payer: Self-pay

## 2015-09-20 ENCOUNTER — Emergency Department (HOSPITAL_COMMUNITY)
Admission: EM | Admit: 2015-09-20 | Discharge: 2015-09-20 | Disposition: A | Discharge status: Home / Self Care | Payer: Medicaid Other | Attending: Emergency Medicine | Admitting: Emergency Medicine

## 2015-09-20 DIAGNOSIS — W1839XA Other fall on same level, initial encounter: Secondary | ICD-10-CM | POA: Insufficient documentation

## 2015-09-20 DIAGNOSIS — Y998 Other external cause status: Secondary | ICD-10-CM | POA: Insufficient documentation

## 2015-09-20 DIAGNOSIS — Y9301 Activity, walking, marching and hiking: Secondary | ICD-10-CM | POA: Insufficient documentation

## 2015-09-20 DIAGNOSIS — I10 Essential (primary) hypertension: Secondary | ICD-10-CM | POA: Insufficient documentation

## 2015-09-20 DIAGNOSIS — Z8709 Personal history of other diseases of the respiratory system: Secondary | ICD-10-CM | POA: Insufficient documentation

## 2015-09-20 DIAGNOSIS — Z79899 Other long term (current) drug therapy: Secondary | ICD-10-CM | POA: Diagnosis not present

## 2015-09-20 DIAGNOSIS — Z792 Long term (current) use of antibiotics: Secondary | ICD-10-CM | POA: Insufficient documentation

## 2015-09-20 DIAGNOSIS — Z7952 Long term (current) use of systemic steroids: Secondary | ICD-10-CM | POA: Insufficient documentation

## 2015-09-20 DIAGNOSIS — S82401A Unspecified fracture of shaft of right fibula, initial encounter for closed fracture: Secondary | ICD-10-CM

## 2015-09-20 DIAGNOSIS — S8261XA Displaced fracture of lateral malleolus of right fibula, initial encounter for closed fracture: Secondary | ICD-10-CM | POA: Diagnosis not present

## 2015-09-20 DIAGNOSIS — Y92000 Kitchen of unspecified non-institutional (private) residence as  the place of occurrence of the external cause: Secondary | ICD-10-CM | POA: Insufficient documentation

## 2015-09-20 DIAGNOSIS — S99911A Unspecified injury of right ankle, initial encounter: Secondary | ICD-10-CM | POA: Diagnosis present

## 2015-09-20 DIAGNOSIS — F329 Major depressive disorder, single episode, unspecified: Secondary | ICD-10-CM | POA: Insufficient documentation

## 2015-09-20 MED ORDER — NAPROXEN 375 MG PO TABS: ORAL_TABLET | ORAL | Status: DC

## 2015-09-20 MED ORDER — HYDROCODONE-ACETAMINOPHEN 5-325 MG PO TABS: 1.0000 | ORAL_TABLET | Freq: Four times a day (QID) | ORAL | Status: DC | PRN

## 2015-09-20 MED ORDER — HYDROCODONE-ACETAMINOPHEN 5-325 MG PO TABS
1.0000 | ORAL_TABLET | Freq: Once | ORAL | Status: AC
Start: 2015-09-20 — End: 2015-09-20
  Administered 2015-09-20: 1 via ORAL
  Filled 2015-09-20: qty 1

## 2015-09-20 NOTE — ED Notes (Signed)
Patient states that she was walking from the soup kitchen yesterday morning and twisted her right ankle and fell to the ground.  I have been walking on it since then per pt.

## 2015-09-20 NOTE — ED Provider Notes (Signed)
CSN: [id number]     Arrival date & time 09/20/15  0320 History   First MD Initiated Contact with Patient 09/20/15 0405     Chief Complaint  Patient presents with  . Ankle Pain     (Consider location/radiation/quality/duration/timing/severity/associated sxs/prior Treatment) HPI  Patient states on October 28 she was walking and stepped in a hole and injured her right ankle. She states she heard some cracking noise and it is still cracking when she tries to walk on it. She denies having any other injury such as hitting her head or wrist injury. She states she has been taking ibuprofen without relief.  PCP Dr Legrand Rams  Past Medical History  Diagnosis Date  . Hypertension   . Bronchitis   . Headache   . Depression   . Cocaine abuse   . MDD (major depressive disorder) [facility])    Past Surgical History  Procedure Laterality Date  . Cysts removed from armpits bilaterally    . Colonoscopy with propofol N/A 06/23/2015    Procedure: COLONOSCOPY WITH PROPOFOL;  Surgeon: Danie Binder, MD;  Location: [facility];  Service: Endoscopy;  Laterality: N/A;  Cecum time in 1411 time out 1429   total time 18 minutes  . Polypectomy N/A 06/23/2015    Procedure: POLYPECTOMY;  Surgeon: Danie Binder, MD;  Location: [facility];  Service: Endoscopy;  Laterality: N/A;  descending colon   Family History  Problem Relation Age of Onset  . Heart failure Mother   . Coronary artery disease Mother   . Heart failure Father   . Coronary artery disease Father   . Colon cancer Neg Hx    Social History  Substance Use Topics  . Smoking status: Never Smoker   . Smokeless tobacco: Never Used     Comment: vague reports of smoking intermittently   . Alcohol Use: Yes     Comment: 40oz beer daily    unemployed  OB History    Gravida Para Term Preterm AB TAB SAB Ectopic Multiple Living   5 3 2 1 2  2         Review of Systems  All other systems reviewed and are negative.     Allergies  Review of patient's allergies  indicates no known allergies.  Home Medications   Prior to Admission medications   Medication Sig Start Date End Date Taking? Authorizing Provider  albuterol (PROVENTIL HFA;VENTOLIN HFA) 108 (90 BASE) MCG/ACT inhaler Inhale 2 puffs into the lungs every 4 (four) hours as needed for wheezing or shortness of breath. 08/27/15  Yes Forde Dandy, MD  azithromycin (ZITHROMAX) 250 MG tablet Take 1 tablet (250 mg total) by mouth daily. Take one tablet daily starting 08/28/2015 08/28/15   Forde Dandy, MD  HYDROcodone-acetaminophen (NORCO/VICODIN) 5-325 MG tablet Take 1 tablet by mouth every 6 (six) hours as needed for moderate pain. 09/20/15   Rolland Porter, MD  Linaclotide (LINZESS) 145 MCG CAPS capsule Take 1 capsule (145 mcg total) by mouth daily. 30 minutes before breakfast Patient not taking: Reported on 08/27/2015 06/08/15   Orvil Feil, NP  naproxen (NAPROSYN) 375 MG tablet Take 1 po BID with food prn pain 09/20/15   Rolland Porter, MD  predniSONE (DELTASONE) 10 MG tablet Take 4 tablets (40 mg total) by mouth daily. 08/28/15   Forde Dandy, MD  sertraline (ZOLOFT) 50 MG tablet Take 1 tablet (50 mg total) by mouth daily. 02/14/15   Ruben Im, PA-C  traZODone (DESYREL) 50 MG  tablet Take 1 tablet (50 mg total) by mouth at bedtime as needed for sleep. Patient not taking: Reported on 08/27/2015 02/14/15   Marlane Hatcher Mashburn, PA-C   BP 143/99 mmHg  Pulse 62  Temp(Src) 96.4 F (35.8 C) (Oral)  Resp 20  Ht 5\' 9"  (1.753 m)  Wt 165 lb (74.844 kg)  BMI 24.36 kg/m2  SpO2 99%  Vital signs normal   Physical Exam  Constitutional: [name], place, and time. She appears well-developed and well-nourished.  Non-toxic appearance. She does not appear ill. No distress.  HENT:  Head: Normocephalic and atraumatic.  Right Ear: External ear normal.  Left Ear: External ear normal.  Nose: Nose normal. No mucosal edema or rhinorrhea.  Mouth/Throat: Oropharynx is clear and moist and mucous membranes are  normal. No dental abscesses or uvula swelling.  Eyes: Conjunctivae and EOM are normal. Pupils are equal, round, and reactive to light.  Neck: Normal range of motion and full passive range of motion without pain. Neck supple.  Pulmonary/Chest: Effort normal. No respiratory distress. She has no rhonchi. She exhibits no crepitus.  Abdominal: Normal appearance. There is no tenderness.  Musculoskeletal: Normal range of motion. She exhibits no edema or tenderness.       Feet:  Moves all extremities well. Pt is nontender over her knee and prox lower leg, has tenderness to palpation over her right lateral malleolus with swelling extending down into the lateral foot that is nontender. Good distal pulses and cap refill  Neurological: She is alert and oriented to person, place, and time. She has normal strength. No cranial nerve deficit.  Skin: Skin is warm, dry and intact. No rash noted. No erythema. No pallor.  Psychiatric: Her speech is normal and behavior is normal.  Dramatic  Nursing note and vitals reviewed.      ED Course  Procedures (including critical care time) Medications  HYDROcodone-acetaminophen (NORCO/VICODIN) 5-325 MG per tablet 1 tablet (1 tablet Oral Given 09/20/15 0641)   Patient was given medication for pain. She was placed in a cam walker. Patient has significant alcohol intake daily, she was not placed in crutches. When I went back into the room to tell patient x-ray result she was sleeping soundly in no distress.   Imaging Review Dg Ankle Complete Right  09/20/2015  CLINICAL DATA:  Right lateral malleolar pain after stepping into hole and twisting ankle 2 days ago. Initial encounter. EXAM: RIGHT ANKLE - COMPLETE 3+ VIEW COMPARISON:  None. FINDINGS: There is a minimally displaced fracture involving the distal tip of the lateral malleolus, with overlying soft tissue swelling. The ankle mortise is intact; the interosseous space is within normal limits. No talar tilt or  subluxation is seen. A small plantar calcaneal spur is noted. The joint spaces are preserved. Mild dorsal soft tissue swelling is also noted at the ankle. IMPRESSION: Minimally displaced fracture involving the distal tip of the lateral malleolus. Electronically Signed   By: Garald Balding M.D.   On: 09/20/2015 05:56   I have personally reviewed and evaluated these images and lab results as part of my medical decision-making.    MDM   Final diagnoses:  Fibula fracture, right, closed, initial encounter    New Prescriptions   HYDROCODONE-ACETAMINOPHEN (NORCO/VICODIN) 5-325 MG TABLET    Take 1 tablet by mouth every 6 (six) hours as needed for moderate pain.   NAPROXEN (NAPROSYN) 375 MG TABLET    Take 1 po BID with food prn pain  Plan discharge  Rolland Porter, MD, Barbette Or, MD 09/20/15 [id number]

## 2015-09-20 NOTE — Discharge Instructions (Signed)
Elevate your leg. Use ice packs to get the swelling down. Wear the special boot. I gave you the names of the two orthopedists in [place], call the office to get an appointment to be seen in about 1 week to make sure you are healing well. You have a "fracture of the tip of the distal fibula".  Fibular Ankle Fracture Treated With or Without Immobilization, Adult A fibular fracture at your ankle is a break (fracture) bone in the smallest of the two bones in your lower leg, located on the outside of your leg (fibula) close to the area at your ankle joint. CAUSES  Rolling your ankle.  Twisting your ankle.  Extreme flexing or extending of your foot.  Severe force on your ankle as when falling from a distance. RISK FACTORS  Jumping activities.  Participation in sports.  Osteoporosis.  Advanced age.  Previous ankle injuries. SIGNS AND SYMPTOMS  Pain.  Swelling.  Inability to put weight on injured ankle.  Bruising.  Bone deformities at site of injury. DIAGNOSIS  This fracture is diagnosed with the help of an X-ray exam. TREATMENT  If the fractured bone did not move out of place it usually will heal without problems and does casting or splinting. If immobilization is needed for comfort or the fractured bone moved out of place and will not heal properly with immobilization, a cast or splint will be used. HOME CARE INSTRUCTIONS   Apply ice to the area of injury:  Put ice in a plastic bag.  Place a towel between your skin and the bag.  Leave the ice on for 20 minutes, 2-3 times a day.  Use crutches as directed. Resume walking without crutches as directed by your health care provider.  Only take over-the-counter or prescription medicines for pain, discomfort, or fever as directed by your health care provider.  If you have a removable splint or boot, do not remove the boot unless directed by your health care provider. SEEK MEDICAL CARE IF:   You have continued pain or more  swelling  The medications do not control the pain. SEEK IMMEDIATE MEDICAL CARE IF:  You develop severe pain in the leg or foot.  Your skin or nails below the injury turn blue or grey or feel cold or numb. MAKE SURE YOU:   Understand these instructions.  Will watch your condition.  Will get help right away if you are not doing well or get worse.   This information is not intended to replace advice given to you by your health care provider. Make sure you discuss any questions you have with your health care provider.   Document Released: 11/07/2005 Document Revised: 11/28/2014 Document Reviewed: 06/19/2013 Elsevier Interactive Patient Education [employer].

## 2015-10-06 ENCOUNTER — Encounter: Payer: Self-pay | Admitting: Gastroenterology

## 2015-11-12 ENCOUNTER — Ambulatory Visit (INDEPENDENT_AMBULATORY_CARE_PROVIDER_SITE_OTHER): Payer: Medicaid Other | Admitting: Gastroenterology

## 2015-11-12 ENCOUNTER — Encounter: Payer: Self-pay | Admitting: Gastroenterology

## 2015-11-12 VITALS — BP 136/98 | HR 63 | Temp 96.7°F | Ht 69.0 in | Wt 164.0 lb

## 2015-11-12 DIAGNOSIS — K648 Other hemorrhoids: Secondary | ICD-10-CM | POA: Diagnosis not present

## 2015-11-12 DIAGNOSIS — K5901 Slow transit constipation: Secondary | ICD-10-CM

## 2015-11-12 MED ORDER — LINACLOTIDE 290 MCG PO CAPS: 290.0000 ug | ORAL_CAPSULE | Freq: Every day | ORAL | Status: DC

## 2015-11-12 NOTE — Patient Instructions (Addendum)
YOU SHOULD CALL DR. Arnoldo Morale TO HAVE YOUR HEMORRHOIDS FIXED.  USE PREPARATION H FOUR TIMES  A DAY AS NEEDED TO RELIEVE RECTAL PAIN/PRESSURE/BLEEDING.  TAKE TWO LINZESS 145 MCG TABLETS 30 MIN BEFORE BREAKFAST. AFTER YOUR BOTTLE IS EMPTY, START NEW PRESCRIPTION FOR 290 MCG 30 MINS PRIOR TO BREAKFAST.  DRINK WATER TO KEEP YOUR URINE LIGHT YELLOW.  FOLLOW A HIGH FIBER DIET. AVOID ITEMS THAT CAUSE BLOATING & GAS.   PLEASE CALL WITH QUESTIONS OR CONCERNS.  FOLLOW UP IN 6 MOS.   High-Fiber Diet A high-fiber diet changes your normal diet to include more whole grains, legumes, fruits, and vegetables. Changes in the diet involve replacing refined carbohydrates with unrefined foods. The calorie level of the diet is essentially unchanged. The Dietary Reference Intake (recommended amount) for adult males is 38 grams per day. For adult females, it is 25 grams per day. Pregnant and lactating women should consume 28 grams of fiber per day. Fiber is the intact part of a plant that is not broken down during digestion. Functional fiber is fiber that has been isolated from the plant to provide a beneficial effect in the body. PURPOSE  Increase stool bulk.   Ease and regulate bowel movements.   Lower cholesterol.  REDUCE RISK OF COLON CANCER  INDICATIONS THAT YOU NEED MORE FIBER  Constipation and hemorrhoids.   Uncomplicated diverticulosis (intestine condition) and irritable bowel syndrome.   Weight management.   As a protective measure against hardening of the arteries (atherosclerosis), diabetes, and cancer.   GUIDELINES FOR INCREASING FIBER IN THE DIET  Start adding fiber to the diet slowly. A gradual increase of about 5 more grams (2 slices of whole-wheat bread, 2 servings of most fruits or vegetables, or 1 bowl of high-fiber cereal) per day is best. Too rapid an increase in fiber may result in constipation, flatulence, and bloating.   Drink enough water and fluids to keep your urine clear or  pale yellow. Water, juice, or caffeine-free drinks are recommended. Not drinking enough fluid may cause constipation.   Eat a variety of high-fiber foods rather than one type of fiber.   Try to increase your intake of fiber through using high-fiber foods rather than fiber pills or supplements that contain small amounts of fiber.   The goal is to change the types of food eaten. Do not supplement your present diet with high-fiber foods, but replace foods in your present diet.   INCLUDE A VARIETY OF FIBER SOURCES  Replace refined and processed grains with whole grains, canned fruits with fresh fruits, and incorporate other fiber sources. White rice, white breads, and most bakery goods contain little or no fiber.   Brown whole-grain rice, buckwheat oats, and many fruits and vegetables are all good sources of fiber. These include: broccoli, Brussels sprouts, cabbage, cauliflower, beets, sweet potatoes, white potatoes (skin on), carrots, tomatoes, eggplant, squash, berries, fresh fruits, and dried fruits.   Cereals appear to be the richest source of fiber. Cereal fiber is found in whole grains and bran. Bran is the fiber-rich outer coat of cereal grain, which is largely removed in refining. In whole-grain cereals, the bran remains. In breakfast cereals, the largest amount of fiber is found in those with "bran" in their names. The fiber content is sometimes indicated on the label.   You may need to include additional fruits and vegetables each day.   In baking, for 1 cup white flour, you may use the following substitutions:   1 cup whole-wheat flour minus  2 tablespoons.   1/2 cup white flour plus 1/2 cup whole-wheat flour.

## 2015-11-12 NOTE — Assessment & Plan Note (Signed)
SYMPTOMS NOT IDEALLY CONTROLLED. FAILED MEDICAL MANAGEMENT. SCARED TO HAVE SURGERY.  ENCOURAGED PT TO CALL DR. Arnoldo Morale TO HAVE HEMORRHOIDS FIXED. USE PREPARATION H FOUR TIME  A DAY AS NEEDED TO RELIEVE RECTAL PAIN/PRESSURE/BLEEDING. TAKE TWO LINZESS 145 MCG TABLETS 30 MIN BEFORE BREAKFAST. AFTER YOUR BOTTLE IS EMPTY, START NEW PRESCRIPTION FOR 290 MCG 30 MINS PRIOR TO BREAKFAST. DRINK WATER TO KEEP YOUR URINE LIGHT YELLOW. FOLLOW A HIGH FIBER DIET. AVOID ITEMS THAT CAUSE BLOATING & GAS.  HANDOUT GIVEN. FOLLOW UP IN 6 MOS.

## 2015-11-12 NOTE — Progress Notes (Signed)
   Subjective:    Patient ID: Becky [name], female    DOB: March 08, 1961, 54 y.o.   MRN: [id number]  FANTA,TESFAYE, MD  HPI Still havinG trouble with HEMORRHOIDS. NEEDS SOME MORE LINZESS. KIND OF WORKED. CURRENTLY DEPRESSED. GOT RX FILLED. RX COVERED BY MEDICAID. RARE NAUSEA/VOMTIING. BMs: 2-3 TIMES A WEEK. HAD DIARRHEA COUPLE WEEKS AGO. BAD BACK PROBLEMS. HERNIA  BOTHERS HER ALL THE TIME. PT DENIES FEVER, CHILLS, HEMATOCHEZIA, HEMATEMESIS, nausea, vomiting, melena, CHEST PAIN, SHORTNESS OF BREATH, CHANGE IN BOWEL IN HABITS, constipation, problems swallowing, problems with sedation,OR heartburn or indigestion.   Past Medical History  Diagnosis Date  . Hypertension   . Bronchitis   . Headache   . Depression   . Cocaine abuse   . MDD (major depressive disorder) [facility])    Past Surgical History  Procedure Laterality Date  . Cysts removed from armpits bilaterally    . Colonoscopy with propofol N/A 06/23/2015    SLF: 1. Rectal bleeding/pain due to hemorrhoids2. one large and one small colorectal polyp removed.   . Polypectomy N/A 06/23/2015    Procedure: POLYPECTOMY;  Surgeon: Danie Binder, MD;  Location: [facility];  Service: Endoscopy;  Laterality: N/A;  descending colon   No Known Allergies  Current Outpatient Prescriptions  Medication Sig Dispense Refill  . albuterol (PROVENTIL HFA;VENTOLIN HFA) 108 (90 BASE) MCG/ACT inhaler Inhale 2 puffs into the lungs every 4 (four) hours as needed for wheezing or shortness of breath.    . Linaclotide (LINZESS) 145 MCG CAPS capsule Take 1 capsule (145 mcg total) by mouth daily. 30 minutes before breakfast    .      . HYDROcodone-acetaminophen (NORCO/VICODIN) 5-325 MG tablet Take 1 tablet by mouth every 6 (six) hours as needed for moderate pain. (Patient not taking: Reported on 11/12/2015)    .      .      .      .       Review of Systems PER HPI OTHERWISE ALL SYSTEMS ARE NEGATIVE.    Objective:   Physical Exam  Constitutional: She is oriented to  person, place, and time. She appears well-developed and well-nourished. No distress.  HENT:  Head: Normocephalic and atraumatic.  Mouth/Throat: Oropharynx is clear and moist. No oropharyngeal exudate.  Eyes: Pupils are equal, round, and reactive to light. No scleral icterus.  Neck: Normal range of motion. Neck supple.  Cardiovascular: Normal rate, regular rhythm and normal heart sounds.   Pulmonary/Chest: Effort normal and breath sounds normal. No respiratory distress.  Abdominal: Soft. Bowel sounds are normal. She exhibits no distension. There is tenderness. There is no rebound and no guarding.  MILD TTP IN MIDLINE PERIUMBILICAL REGION   Musculoskeletal: She exhibits no edema.  Lymphadenopathy:    She has no cervical adenopathy.  Neurological: She is alert and oriented to person, place, and time.  NO FOCAL DEFICITS  Psychiatric:  TEARFUL, SLIGHTLY DEPRESSED MOOD, NL AFFECT  Vitals reviewed.     Assessment & Plan:

## 2015-11-12 NOTE — Progress Notes (Signed)
ON RECALL  °

## 2015-11-12 NOTE — Progress Notes (Signed)
CC'ED TO PCP 

## 2015-11-12 NOTE — Assessment & Plan Note (Signed)
SYMPTOMS NOT IDEALLY CONTROLLED ON LINZESS 145 MCG DAILY.  TAKE TWO LINZESS 145 MCG TABLETS 30 MIN BEFORE BREAKFAST. AFTER YOUR BOTTLE IS EMPTY, START NEW PRESCRIPTION FOR 290 MCG 30 MINS PRIOR TO BREAKFAST. DRINK WATER TO KEEP YOUR URINE LIGHT YELLOW. FOLLOW A HIGH FIBER DIET. AVOID ITEMS THAT CAUSE BLOATING & GAS.  HANDOUT GIVEN. CALL WITH QUESTIONS OR CONCERNS. FOLLOW UP IN 6 MOS.

## 2016-01-17 ENCOUNTER — Emergency Department (HOSPITAL_COMMUNITY): Payer: Medicaid Other

## 2016-01-17 ENCOUNTER — Emergency Department (HOSPITAL_COMMUNITY)
Admission: EM | Admit: 2016-01-17 | Discharge: 2016-01-17 | Discharge status: Against Medical Advice | Payer: Medicaid Other | Attending: Emergency Medicine | Admitting: Emergency Medicine

## 2016-01-17 ENCOUNTER — Encounter (HOSPITAL_COMMUNITY): Payer: Self-pay

## 2016-01-17 DIAGNOSIS — M545 Low back pain: Secondary | ICD-10-CM | POA: Insufficient documentation

## 2016-01-17 DIAGNOSIS — Z79899 Other long term (current) drug therapy: Secondary | ICD-10-CM | POA: Insufficient documentation

## 2016-01-17 DIAGNOSIS — M549 Dorsalgia, unspecified: Secondary | ICD-10-CM

## 2016-01-17 DIAGNOSIS — Z8709 Personal history of other diseases of the respiratory system: Secondary | ICD-10-CM | POA: Insufficient documentation

## 2016-01-17 DIAGNOSIS — M62830 Muscle spasm of back: Secondary | ICD-10-CM | POA: Diagnosis not present

## 2016-01-17 DIAGNOSIS — Z8659 Personal history of other mental and behavioral disorders: Secondary | ICD-10-CM | POA: Insufficient documentation

## 2016-01-17 DIAGNOSIS — I10 Essential (primary) hypertension: Secondary | ICD-10-CM | POA: Insufficient documentation

## 2016-01-17 MED ORDER — KETOROLAC TROMETHAMINE 30 MG/ML IJ SOLN: 30.0000 mg | Freq: Once | INTRAMUSCULAR | Status: DC

## 2016-01-17 MED ORDER — METHOCARBAMOL 1000 MG/10ML IJ SOLN
1000.0000 mg | Freq: Once | INTRAVENOUS | Status: DC
  Filled 2016-01-17: qty 10

## 2016-01-17 NOTE — ED Notes (Signed)
Back pain that increased yesterday per pt. Started in my back and hurts into my abdomen. Makes me vomit per pt.

## 2016-01-17 NOTE — ED Provider Notes (Signed)
CSN: [id number]     Arrival date & time 01/17/16  0554 History   First MD Initiated Contact with Patient 01/17/16 [phone]     Chief Complaint  Patient presents with  . Back Pain     (Consider location/radiation/quality/duration/timing/severity/associated sxs/prior Treatment) Patient is a 55 y.o. female presenting with back pain. The history is provided by the patient.  Back Pain She  Has been having low back pain  Intermittently for the last year. This morning, pain was much more severe. She rates it at 10/10. Pain is across the lower lumbar area and shoots up to the upper lumbar area. There is no radiation to the legs but there is sometimes radiation around to the abdomen. She says that this is different from muscle pain that she has had in the past. She has taken ibuprofen without relief. There is no associated nausea or vomiting. She denies dysuria. She denies any bowel or bladder dysfunction. She denies weakness, numbness, tingling. She tells me that she is worried that her kidneys are failing.  Past Medical History  Diagnosis Date  . Hypertension   . Bronchitis   . Headache   . Depression   . Cocaine abuse   . MDD (major depressive disorder) [facility])    Past Surgical History  Procedure Laterality Date  . Cysts removed from armpits bilaterally    . Colonoscopy with propofol N/A 06/23/2015    SLF: 1. Rectal bleeding/pain due to hemorrhoids2. one large and one small colorectal polyp removed.   . Polypectomy N/A 06/23/2015    Procedure: POLYPECTOMY;  Surgeon: Danie Binder, MD;  Location: [facility];  Service: Endoscopy;  Laterality: N/A;  descending colon   Family History  Problem Relation Age of Onset  . Heart failure Mother   . Coronary artery disease Mother   . Heart failure Father   . Coronary artery disease Father   . Colon cancer Neg Hx    Social History  Substance Use Topics  . Smoking status: Never Smoker   . Smokeless tobacco: Never Used     Comment: vague reports of smoking  intermittently   . Alcohol Use: Yes     Comment: 40oz beer daily    OB History    Gravida Para Term Preterm AB TAB SAB Ectopic Multiple Living   5 3 2 1 2  2         Review of Systems  Musculoskeletal: Positive for back pain.  All other systems reviewed and are negative.     Allergies  Review of patient's allergies indicates no known allergies.  Home Medications   Prior to Admission medications   Medication Sig Start Date End Date Taking? Authorizing Provider  albuterol (PROVENTIL HFA;VENTOLIN HFA) 108 (90 BASE) MCG/ACT inhaler Inhale 2 puffs into the lungs every 4 (four) hours as needed for wheezing or shortness of breath. 08/27/15   Forde Dandy, MD  azithromycin (ZITHROMAX) 250 MG tablet Take 1 tablet (250 mg total) by mouth daily. Take one tablet daily starting 08/28/2015 Patient not taking: Reported on 11/12/2015 08/28/15   Forde Dandy, MD  HYDROcodone-acetaminophen (NORCO/VICODIN) 5-325 MG tablet Take 1 tablet by mouth every 6 (six) hours as needed for moderate pain. Patient not taking: Reported on 11/12/2015 09/20/15   Rolland Porter, MD  Linaclotide [facility]) 290 MCG CAPS capsule Take 1 capsule (290 mcg total) by mouth daily. 30 minutes before breakfast 11/12/15   Danie Binder, MD  naproxen (NAPROSYN) 375 MG tablet Take 1 po BID  with food prn pain Patient not taking: Reported on 11/12/2015 09/20/15   Rolland Porter, MD  predniSONE (DELTASONE) 10 MG tablet Take 4 tablets (40 mg total) by mouth daily. Patient not taking: Reported on 11/12/2015 08/28/15   Forde Dandy, MD  sertraline (ZOLOFT) 50 MG tablet Take 1 tablet (50 mg total) by mouth daily. Patient not taking: Reported on 11/12/2015 02/14/15   Ruben Im, PA-C  traZODone (DESYREL) 50 MG tablet Take 1 tablet (50 mg total) by mouth at bedtime as needed for sleep. Patient not taking: Reported on 08/27/2015 02/14/15   Marlane Hatcher Mashburn, PA-C   BP 148/94 mmHg  Pulse 74  Temp(Src) 97.9 F (36.6 C) (Oral)  Resp 16  Ht 5\' 9"   (1.753 m)  Wt 164 lb (74.39 kg)  BMI 24.21 kg/m2  SpO2 97% Physical Exam  Nursing note and vitals reviewed.  55 year old female,  Who appears uncomfortable, but is in no acute distress. Vital signs are  significant for hypertension. Oxygen saturation is 97%, which is normal. Head is normocephalic and atraumatic. PERRLA, EOMI. Oropharynx is clear. Neck is nontender and supple without adenopathy or JVD. Back  As moderate bilateral paralumbar spasm. There is mild tenderness across the lower lumbar area without point tenderness. Straight leg raise is positive bilaterally at 30. There is no CVA tenderness. Lungs are clear without rales, wheezes, or rhonchi. Chest is nontender. Heart has regular rate and rhythm without murmur. Abdomen is soft, flat, nontender without masses or hepatosplenomegaly and peristalsis is normoactive. Extremities have no cyanosis or edema, full range of motion is present. Skin is warm and dry without rash. Neurologic: Mental status is normal, cranial nerves are intact, there are no motor or sensory deficits.  ED Course  Procedures (including critical care time)  MDM   Final diagnoses:  Back pain    Low back pain which certainly appears discussed skeletal my exam. However, patient is adamant that it is something different. She will be sent for CT renal stone protocol and screening labs and urinalysis obtained. She will also be given dose of ketorolac and methocarbamol. Case is signed out to Dr. Lacinda Axon.  Old records are reviewed and she has no prior visits for back pain. Renal stone CT scan 1 year ago did not show any evidence of aneurysm. I have reviewed her record on the [place] controlled substance reporting website and she only has had only one narcotic prescription in the last 6 months.  After above evaluation and treatment was ordered, patient walked out stating that she didn't want to waste anybody's time. She would not wait to signed AMA  forms.    Delora Fuel, MD [id number] [phone]

## 2016-01-17 NOTE — ED Notes (Signed)
Pt witnessed walking out of dept. With steady gait.

## 2016-01-18 ENCOUNTER — Encounter (HOSPITAL_COMMUNITY): Payer: Self-pay | Admitting: Emergency Medicine

## 2016-01-18 ENCOUNTER — Emergency Department (HOSPITAL_COMMUNITY)
Admission: EM | Admit: 2016-01-18 | Discharge: 2016-01-19 | Disposition: A | Discharge status: Other Acute Inpatient Hospital | Payer: Medicaid Other | Attending: Emergency Medicine | Admitting: Emergency Medicine

## 2016-01-18 DIAGNOSIS — F339 Major depressive disorder, recurrent, unspecified: Secondary | ICD-10-CM | POA: Diagnosis not present

## 2016-01-18 DIAGNOSIS — F17210 Nicotine dependence, cigarettes, uncomplicated: Secondary | ICD-10-CM | POA: Insufficient documentation

## 2016-01-18 DIAGNOSIS — Z79899 Other long term (current) drug therapy: Secondary | ICD-10-CM | POA: Diagnosis not present

## 2016-01-18 DIAGNOSIS — F332 Major depressive disorder, recurrent severe without psychotic features: Secondary | ICD-10-CM

## 2016-01-18 DIAGNOSIS — R45851 Suicidal ideations: Secondary | ICD-10-CM | POA: Diagnosis present

## 2016-01-18 DIAGNOSIS — F1490 Cocaine use, unspecified, uncomplicated: Secondary | ICD-10-CM | POA: Insufficient documentation

## 2016-01-18 DIAGNOSIS — I10 Essential (primary) hypertension: Secondary | ICD-10-CM | POA: Insufficient documentation

## 2016-01-18 LAB — SALICYLATE LEVEL: Salicylate Lvl: <4 mg/dL (ref 2.8–30.0)

## 2016-01-18 LAB — BASIC METABOLIC PANEL
Anion gap: 8 (ref 5–15)
BUN: 14 mg/dL (ref 6–20)
CHLORIDE: 114 mmol/L — AB (ref 101–111)
CO2: 21 mmol/L — ABNORMAL LOW (ref 22–32)
Calcium: 8.6 mg/dL — ABNORMAL LOW (ref 8.9–10.3)
Creatinine, Ser: 0.82 mg/dL (ref 0.44–1.00)
GFR calc Af Amer: >60 mL/min (ref >60)
GFR calc non Af Amer: >60 mL/min (ref >60)
GLUCOSE: 94 mg/dL (ref 65–99)
POTASSIUM: 3.9 mmol/L (ref 3.5–5.1)
SODIUM: 143 mmol/L (ref 135–145)

## 2016-01-18 LAB — CBC WITH DIFFERENTIAL/PLATELET
Basophils Absolute: 0 10*3/uL (ref 0.0–0.1)
Basophils Relative: 1 %
Eosinophils Absolute: 0.1 10*3/uL (ref 0.0–0.7)
Eosinophils Relative: 2 %
HCT: 41.7 % (ref 36.0–46.0)
HEMOGLOBIN: 13.9 g/dL (ref 12.0–15.0)
LYMPHS ABS: 2.8 10*3/uL (ref 0.7–4.0)
LYMPHS PCT: 53 %
MCH: 29.1 pg (ref 26.0–34.0)
MCHC: 33.3 g/dL (ref 30.0–36.0)
MCV: 87.4 fL (ref 78.0–100.0)
Monocytes Absolute: 0.3 10*3/uL (ref 0.1–1.0)
Monocytes Relative: 6 %
NEUTROS ABS: 2 10*3/uL (ref 1.7–7.7)
Neutrophils Relative %: 38 %
Platelets: 180 10*3/uL (ref 150–400)
RBC: 4.77 MIL/uL (ref 3.87–5.11)
RDW: 13.9 % (ref 11.5–15.5)
WBC: 5.3 10*3/uL (ref 4.0–10.5)

## 2016-01-18 LAB — RAPID URINE DRUG SCREEN, HOSP PERFORMED
Amphetamines: NOT DETECTED
Barbiturates: NOT DETECTED
Benzodiazepines: POSITIVE — AB
Cocaine: POSITIVE — AB
Opiates: NOT DETECTED
Tetrahydrocannabinol: NOT DETECTED

## 2016-01-18 LAB — ACETAMINOPHEN LEVEL: Acetaminophen (Tylenol), Serum: <10 ug/mL — ABNORMAL LOW (ref 10–30)

## 2016-01-18 LAB — ETHANOL: ALCOHOL ETHYL (B): 5 mg/dL — AB (ref <5)

## 2016-01-18 NOTE — ED Notes (Signed)
Patient brought in by [facility] with IVC paperwork stating patient was threatening to kill her sister and then kill herself. Paperwork states she had plan to kill sister and then "turn the gas on herself and burn her ass up." Patient denies SI / HI at triage. States "I just got upset today because I don't have a job and my sister kept calling me talking junk, so then I said stuff I shouldn't have because I was mad. I wouldn't hurt myself or nobody else. I would never hurt my sister."

## 2016-01-18 NOTE — ED Provider Notes (Signed)
CSN: [id number]     Arrival date & time 01/18/16  2124 History  By signing my name below, I, Doran Stabler, attest that this documentation has been prepared under the direction and in the presence of Merrily Pew, MD. Electronically Signed: Doran Stabler, ED Scribe. 01/18/2016. 9:39 PM.   Chief Complaint  Patient presents with  . V70.1   HPI HPI Comments: Becky [name] is a 55 y.o. female who presents to the Emergency Department complaining of IVC, suicidal.   As per police reports, "pt was threatening to kill herself and to kill her sister. She states "if she killed her sister, she would pour gas on her and burn her up. She hated her sister with every breath in her body. She was crying and stated that everyone hated her including jesus. She reported that she has nothing and is in a deep state of depression."  Patient states she's not suicidal and just said that in a moment of anger. Also states she took trazodone prior to coming to ED. No other complaints. No other associated symptoms or modifying factors.   Past Medical History  Diagnosis Date  . Hypertension   . Bronchitis   . Headache   . Depression   . Cocaine abuse   . MDD (major depressive disorder) [facility])    Past Surgical History  Procedure Laterality Date  . Cysts removed from armpits bilaterally    . Colonoscopy with propofol N/A 06/23/2015    SLF: 1. Rectal bleeding/pain due to hemorrhoids2. one large and one small colorectal polyp removed.   . Polypectomy N/A 06/23/2015    Procedure: POLYPECTOMY;  Surgeon: Danie Binder, MD;  Location: [facility];  Service: Endoscopy;  Laterality: N/A;  descending colon   Family History  Problem Relation Age of Onset  . Heart failure Mother   . Coronary artery disease Mother   . Heart failure Father   . Coronary artery disease Father   . Colon cancer Neg Hx    Social History  Substance Use Topics  . Smoking status: Never Smoker   . Smokeless tobacco: Never Used     Comment: vague  reports of smoking intermittently   . Alcohol Use: Yes     Comment: 40oz beer daily    OB History    Gravida Para Term Preterm AB TAB SAB Ectopic Multiple Living   5 3 2 1 2  2         Review of Systems  Psychiatric/Behavioral: Negative for suicidal ideas, sleep disturbance and self-injury. The patient is not nervous/anxious.   All other systems reviewed and are negative.     Allergies  Review of patient's allergies indicates no known allergies.  Home Medications   Prior to Admission medications   Medication Sig Start Date End Date Taking? Authorizing Provider  albuterol (PROVENTIL HFA;VENTOLIN HFA) 108 (90 BASE) MCG/ACT inhaler Inhale 2 puffs into the lungs every 4 (four) hours as needed for wheezing or shortness of breath. 08/27/15   Forde Dandy, MD  azithromycin (ZITHROMAX) 250 MG tablet Take 1 tablet (250 mg total) by mouth daily. Take one tablet daily starting 08/28/2015 Patient not taking: Reported on 11/12/2015 08/28/15   Forde Dandy, MD  HYDROcodone-acetaminophen (NORCO/VICODIN) 5-325 MG tablet Take 1 tablet by mouth every 6 (six) hours as needed for moderate pain. Patient not taking: Reported on 11/12/2015 09/20/15   Rolland Porter, MD  Linaclotide [facility]) 290 MCG CAPS capsule Take 1 capsule (290 mcg total) by mouth daily. [place]  minutes before breakfast 11/12/15   Danie Binder, MD  naproxen (NAPROSYN) 375 MG tablet Take 1 po BID with food prn pain Patient not taking: Reported on 11/12/2015 09/20/15   Rolland Porter, MD  predniSONE (DELTASONE) 10 MG tablet Take 4 tablets (40 mg total) by mouth daily. Patient not taking: Reported on 11/12/2015 08/28/15   Forde Dandy, MD  sertraline (ZOLOFT) 50 MG tablet Take 1 tablet (50 mg total) by mouth daily. Patient not taking: Reported on 11/12/2015 02/14/15   Ruben Im, PA-C  traZODone (DESYREL) 50 MG tablet Take 1 tablet (50 mg total) by mouth at bedtime as needed for sleep. Patient not taking: Reported on 08/27/2015 02/14/15   Marlane Hatcher  Mashburn, PA-C   BP 167/111 mmHg  Pulse 66  Temp(Src) 98 F (36.7 C) (Oral)  Resp 20  Ht 5\' 9"  (1.753 m)  Wt 165 lb (74.844 kg)  BMI 24.36 kg/m2  SpO2 99% Physical Exam  Constitutional: She appears well-developed and well-nourished.  HENT:  Head: Normocephalic and atraumatic.  Neck: Normal range of motion.  Cardiovascular: Normal rate and regular rhythm.   Pulmonary/Chest: No stridor. No respiratory distress.  Abdominal: She exhibits no distension.  Neurological:  Sleepy but alert to voice, drifts off to sleep quickly.   Skin: Skin is warm and dry. No rash noted. No erythema.  Psychiatric: Her speech is not rapid and/or pressured and not delayed. She expresses impulsivity. She expresses no homicidal and no suicidal ideation. She expresses no suicidal plans and no homicidal plans.  Nursing note and vitals reviewed.   ED Course  Procedures (including critical care time) Labs Review Labs Reviewed  URINE RAPID DRUG SCREEN, HOSP PERFORMED  CBC WITH DIFFERENTIAL/PLATELET  BASIC METABOLIC PANEL  ETHANOL    Imaging Review No results found. I have personally reviewed and evaluated these images and lab results as part of my medical decision-making.   EKG Interpretation None      MDM   Final diagnoses:  None    Patient IVC'ed 2/2 suicidal comments. Likely related to her mood at the time, however now she is denying it but can barely keep her eyes open to speak to me. Suspect some lefvel of intoxication, impaired decision making 2/2 likely trazodone or poly substance. Will need reevaluation in morning and if still not suicidal, likely stable for dc after tts consult     Merrily Pew, MD 01/20/16 1126

## 2016-01-19 ENCOUNTER — Encounter (HOSPITAL_COMMUNITY): Payer: Self-pay

## 2016-01-19 ENCOUNTER — Inpatient Hospital Stay (HOSPITAL_COMMUNITY)
Admission: AD | Admit: 2016-01-19 | Discharge: 2016-01-25 | DRG: 885 | Disposition: A | Discharge status: Home / Self Care | Payer: Medicaid Other | Attending: Psychiatry | Admitting: Psychiatry

## 2016-01-19 DIAGNOSIS — F17210 Nicotine dependence, cigarettes, uncomplicated: Secondary | ICD-10-CM | POA: Diagnosis present

## 2016-01-19 DIAGNOSIS — B1920 Unspecified viral hepatitis C without hepatic coma: Secondary | ICD-10-CM | POA: Diagnosis present

## 2016-01-19 DIAGNOSIS — Z9119 Patient's noncompliance with other medical treatment and regimen: Secondary | ICD-10-CM | POA: Diagnosis not present

## 2016-01-19 DIAGNOSIS — G894 Chronic pain syndrome: Secondary | ICD-10-CM | POA: Diagnosis present

## 2016-01-19 DIAGNOSIS — F1310 Sedative, hypnotic or anxiolytic abuse, uncomplicated: Secondary | ICD-10-CM | POA: Clinically undetermined

## 2016-01-19 DIAGNOSIS — F1020 Alcohol dependence, uncomplicated: Secondary | ICD-10-CM | POA: Clinically undetermined

## 2016-01-19 DIAGNOSIS — I10 Essential (primary) hypertension: Secondary | ICD-10-CM | POA: Diagnosis present

## 2016-01-19 DIAGNOSIS — F339 Major depressive disorder, recurrent, unspecified: Secondary | ICD-10-CM | POA: Diagnosis not present

## 2016-01-19 DIAGNOSIS — B182 Chronic viral hepatitis C: Secondary | ICD-10-CM | POA: Insufficient documentation

## 2016-01-19 DIAGNOSIS — F332 Major depressive disorder, recurrent severe without psychotic features: Principal | ICD-10-CM | POA: Diagnosis present

## 2016-01-19 DIAGNOSIS — F329 Major depressive disorder, single episode, unspecified: Secondary | ICD-10-CM | POA: Diagnosis present

## 2016-01-19 DIAGNOSIS — F1420 Cocaine dependence, uncomplicated: Secondary | ICD-10-CM | POA: Clinically undetermined

## 2016-01-19 MED ORDER — FLUTICASONE PROPIONATE 50 MCG/ACT NA SUSP
2.0000 | Freq: Every day | NASAL | Status: DC
  Administered 2016-01-20 – 2016-01-24 (×4): 2 via NASAL
  Filled 2016-01-19 (×2): qty 16

## 2016-01-19 MED ORDER — HYDROXYZINE HCL 25 MG PO TABS: 25.0000 mg | ORAL_TABLET | Freq: Four times a day (QID) | ORAL | Status: DC | PRN

## 2016-01-19 MED ORDER — TRAZODONE HCL 50 MG PO TABS
50.0000 mg | ORAL_TABLET | Freq: Every evening | ORAL | Status: DC | PRN
Start: 2016-01-19 — End: 2016-01-25
  Filled 2016-01-19 (×15): qty 1

## 2016-01-19 MED ORDER — ALUM & MAG HYDROXIDE-SIMETH 200-200-20 MG/5ML PO SUSP: 30.0000 mL | ORAL | Status: DC | PRN

## 2016-01-19 MED ORDER — ACETAMINOPHEN 325 MG PO TABS
650.0000 mg | ORAL_TABLET | Freq: Four times a day (QID) | ORAL | Status: DC | PRN
  Administered 2016-01-19 – 2016-01-20 (×2): 650 mg via ORAL
  Filled 2016-01-19 (×2): qty 2

## 2016-01-19 MED ORDER — MAGNESIUM HYDROXIDE 400 MG/5ML PO SUSP
30.0000 mL | Freq: Every day | ORAL | Status: DC | PRN
  Administered 2016-01-20 – 2016-01-22 (×2): 30 mL via ORAL
  Filled 2016-01-19 (×2): qty 30

## 2016-01-19 NOTE — Progress Notes (Signed)
Pt accepted to [facility] bed 302-2, attending Dr Sabra Heck. Pt under IVC. Report can be called at [phone].  Sharren Bridge, MSW, LCSW Clinical Social Work, Disposition  01/19/2016 [phone]

## 2016-01-19 NOTE — ED Provider Notes (Signed)
Becky [name], TSS, has evaluated patient. She states they are recommending inpatient placement. Patient states she would voluntarily go to [facility] however they do not have any beds right now. She will check and see if there'll be getting that soon.   Rolland Porter, MD, Barbette Or, MD 01/19/16 [phone]

## 2016-01-19 NOTE — Tx Team (Addendum)
Initial Interdisciplinary Treatment Plan   PATIENT STRESSORS: Financial difficulties Marital or family conflict Substance abuse   PATIENT STRENGTHS: General fund of knowledge Motivation for treatment/growth   PROBLEM LIST: Problem List/Patient Goals Date to be addressed Date deferred Reason deferred Estimated date of resolution  Substance Abuse 01/19/16     Suicidal Ideation 01/19/16     Homicidal Ideation 01/19/16                                          DISCHARGE CRITERIA:  Improved stabilization in mood, thinking, and/or behavior  PRELIMINARY DISCHARGE PLAN: Outpatient therapy  PATIENT/FAMIILY INVOLVEMENT: This treatment plan has been presented to and reviewed with the patient, Becky [name], and/or family member.  The patient and family have been given the opportunity to ask questions and make suggestions.  Becky [name] [facility] 01/19/2016, 5:52 PM

## 2016-01-19 NOTE — ED Notes (Signed)
[facility] at bedside to transport patient to [facility].

## 2016-01-19 NOTE — Progress Notes (Signed)
Patient admitted IVC for SI and HI toward her sister.  Patient states that her sister verbally abuses her about her substance abuse issues.  Patient currently has 2 children in the home under age 54 and a 64 year old daughter that helps financially around the home.  Patient reports wanting a job so that she can provide her her small children.  Patient was initially admitted to the 300 hall, however after further assessment she was moved to the 500 hall.  Patient states, "I love beer."  She reports drinking 1 40 oz of beer daily.  She also admits to recent cocaine use.  Patient search performed.  No contraband found.

## 2016-01-19 NOTE — Progress Notes (Signed)
D: Pt presents anxious in affect and mood. Pt is seeking help with substance abuse. Pt reports that she abuses cocaine. Pt's UDS was (+) for benzodiazepines. Pt reports that she occasionally uses xanax that she receives from a friend. Pt denies any withdrawal symptoms. "I'm not withdrawing". Pt is currently denying any SI/HI/AVH. Pt is open to any options that may assist her in staying clean (including but not limited to a Rehab facility).  A: Continued support and availability as needed was extended to this pt. Staff continues to monitor pt with q65min checks.  R: Pt receptive to treatment. Pt remains safe at this time.

## 2016-01-19 NOTE — Progress Notes (Signed)
Adult Psychoeducational Group Note  Date:  01/19/2016 Time:  8:39 PM  Group Topic/Focus:  Wrap-Up Group:   The focus of this group is to help patients review their daily goal of treatment and discuss progress on daily workbooks.  Participation Level:  Active  Participation Quality:  Appropriate  Affect:  Appropriate  Cognitive:  Appropriate  Insight: Appropriate  Engagement in Group:  Engaged  Modes of Intervention:  Discussion  Additional Comments:  The patient expressed that she was going to attend groups while she is here.  Nash Shearer 01/19/2016, 8:39 PM

## 2016-01-19 NOTE — ED Provider Notes (Signed)
I was asked to reassess the patient in the morning. She came in with IVC papers when she called someone in social services and said she was going to kill her self and her sister. At the time of Dr. Evelina Dun evaluation patient had taken trazodone and was hard to keep awake. Patient now is easily awakened. She is able to speak. She states "I would never kill myself or kill anybody else". She states she feels hopeless She doesn't have a job. She states her sister calls her all the time and aggravates her. They do not live together. She states when she made the phone call she thought she was calling a friend. I am going to have TSS do their evaluation now.  Rolland Porter, MD, Barbette Or, MD 01/19/16 [phone]

## 2016-01-19 NOTE — ED Provider Notes (Signed)
Conception Oms called back and states her house supervisor states they are not having any discharges seen. There going to look for placement elsewhere. The first opinion was filled out by myself and signed.  Rolland Porter, MD, Barbette Or, MD 01/19/16 [phone]
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
## 2016-01-20 ENCOUNTER — Encounter (HOSPITAL_COMMUNITY): Payer: Self-pay | Admitting: Psychiatry

## 2016-01-20 DIAGNOSIS — G894 Chronic pain syndrome: Secondary | ICD-10-CM | POA: Diagnosis present

## 2016-01-20 DIAGNOSIS — F332 Major depressive disorder, recurrent severe without psychotic features: Principal | ICD-10-CM

## 2016-01-20 DIAGNOSIS — F1310 Sedative, hypnotic or anxiolytic abuse, uncomplicated: Secondary | ICD-10-CM | POA: Clinically undetermined

## 2016-01-20 DIAGNOSIS — F1020 Alcohol dependence, uncomplicated: Secondary | ICD-10-CM | POA: Clinically undetermined

## 2016-01-20 DIAGNOSIS — F1420 Cocaine dependence, uncomplicated: Secondary | ICD-10-CM

## 2016-01-20 DIAGNOSIS — B182 Chronic viral hepatitis C: Secondary | ICD-10-CM

## 2016-01-20 DIAGNOSIS — B1920 Unspecified viral hepatitis C without hepatic coma: Secondary | ICD-10-CM | POA: Diagnosis present

## 2016-01-20 MED ORDER — LOPERAMIDE HCL 2 MG PO CAPS: 2.0000 mg | ORAL_CAPSULE | ORAL | Status: AC | PRN

## 2016-01-20 MED ORDER — ONDANSETRON 4 MG PO TBDP: 4.0000 mg | ORAL_TABLET | Freq: Four times a day (QID) | ORAL | Status: AC | PRN

## 2016-01-20 MED ORDER — HYDROXYZINE HCL 25 MG PO TABS: 25.0000 mg | ORAL_TABLET | Freq: Four times a day (QID) | ORAL | Status: AC | PRN

## 2016-01-20 MED ORDER — IBUPROFEN 600 MG PO TABS
600.0000 mg | ORAL_TABLET | Freq: Four times a day (QID) | ORAL | Status: DC | PRN
  Administered 2016-01-20 – 2016-01-25 (×6): 600 mg via ORAL
  Filled 2016-01-20 (×6): qty 1

## 2016-01-20 MED ORDER — CHLORDIAZEPOXIDE HCL 25 MG PO CAPS: 25.0000 mg | ORAL_CAPSULE | Freq: Four times a day (QID) | ORAL | Status: AC | PRN

## 2016-01-20 MED ORDER — VITAMIN B-1 100 MG PO TABS
100.0000 mg | ORAL_TABLET | Freq: Every day | ORAL | Status: DC
  Administered 2016-01-21 – 2016-01-25 (×5): 100 mg via ORAL
  Filled 2016-01-20 (×6): qty 1

## 2016-01-20 MED ORDER — CHLORDIAZEPOXIDE HCL 25 MG PO CAPS: 25.0000 mg | ORAL_CAPSULE | ORAL | Status: AC

## 2016-01-20 MED ORDER — CHLORDIAZEPOXIDE HCL 25 MG PO CAPS
25.0000 mg | ORAL_CAPSULE | Freq: Four times a day (QID) | ORAL | Status: AC
  Filled 2016-01-20: qty 1

## 2016-01-20 MED ORDER — LIDOCAINE 5 % EX PTCH
1.0000 | MEDICATED_PATCH | Freq: Every day | CUTANEOUS | Status: DC
  Administered 2016-01-20 – 2016-01-25 (×6): 1 via TRANSDERMAL
  Filled 2016-01-20 (×7): qty 1

## 2016-01-20 MED ORDER — DULOXETINE HCL 30 MG PO CPEP
30.0000 mg | ORAL_CAPSULE | Freq: Every day | ORAL | Status: DC
  Administered 2016-01-20 – 2016-01-22 (×3): 30 mg via ORAL
  Filled 2016-01-20 (×5): qty 1

## 2016-01-20 MED ORDER — CHLORDIAZEPOXIDE HCL 25 MG PO CAPS: 25.0000 mg | ORAL_CAPSULE | Freq: Every day | ORAL | Status: AC

## 2016-01-20 MED ORDER — CHLORDIAZEPOXIDE HCL 25 MG PO CAPS
25.0000 mg | ORAL_CAPSULE | Freq: Three times a day (TID) | ORAL | Status: AC
  Administered 2016-01-21 (×3): 25 mg via ORAL
  Filled 2016-01-20 (×3): qty 1

## 2016-01-20 MED ORDER — LISINOPRIL 10 MG PO TABS
10.0000 mg | ORAL_TABLET | Freq: Every day | ORAL | Status: DC
  Administered 2016-01-20 – 2016-01-25 (×5): 10 mg via ORAL
  Filled 2016-01-20: qty 1
  Filled 2016-01-20: qty 2
  Filled 2016-01-20 (×6): qty 1

## 2016-01-20 MED ORDER — ADULT MULTIVITAMIN W/MINERALS CH
1.0000 | ORAL_TABLET | Freq: Every day | ORAL | Status: DC
  Administered 2016-01-20 – 2016-01-25 (×6): 1 via ORAL
  Filled 2016-01-20 (×8): qty 1

## 2016-01-20 MED ORDER — THIAMINE HCL 100 MG/ML IJ SOLN
100.0000 mg | Freq: Once | INTRAMUSCULAR | Status: DC
  Filled 2016-01-20: qty 2

## 2016-01-20 NOTE — Progress Notes (Signed)
Patient ID: Becky [name], female   DOB: 05-Feb-1961, 55 y.o.   MRN: [id number]  DAR: Pt. Denies SI/HI and A/V Hallucinations. She reports sleep is fair, appetite is good, energy level is low, and concentration is good. She rates her depression 7/10 and hopelessness 10/10. Patient does report pain and is receiving both scheduled and PRN medication for this which she reports has provided some relief. Support and encouragement provided to the patient. Scheduled medications administered to patient per physician's orders however she refused Librium reporting she is not experiencing withdrawal symptoms. Patient is seen in the milieu and is attending groups. She is appropriate with staff and peers and likes to joke around and make others laugh. She reports her goal is to get better emotionally. Q15 minute checks are maintained for safety.

## 2016-01-20 NOTE — BHH Group Notes (Signed)
[facility] Mental Health Association Group Therapy 01/20/2016 1:15pm  Type of Therapy: Mental Health Association Presentation  Participation Level: Active  Participation Quality: Attentive  Affect: Appropriate  Cognitive: Oriented  Insight: Developing/Improving  Engagement in Therapy: Engaged  Modes of Intervention: Discussion, Education and Socialization  Summary of Progress/Problems: Mental Health Association ([place]) Speaker came to talk about his personal journey with substance abuse and mental illness. The pt processed ways by which to relate to the speaker. [name] speaker provided handouts and educational information pertaining to groups and services offered by the [facility]. Pt was engaged in speaker's presentation and was receptive to resources provided.    [name], [place] 01/20/2016 2:14 PM

## 2016-01-20 NOTE — Progress Notes (Signed)
Patient ID: Becky [name], female   DOB: [birth year], 55 y.o.   MRN: [id number]  BP remains elevated, MD Eappen is aware. Patient denies dizziness, lightheadedness, headache, double vision, and pain. She does however report constipation and was given Milk of Magnesia.
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
## 2016-01-20 NOTE — Tx Team (Signed)
Interdisciplinary Treatment Plan Update (Adult) Date: 01/20/2016   Date: 01/20/2016 2:07 PM  Progress in Treatment:  Attending groups: Yes  Participating in groups: Yes  Taking medication as prescribed: Yes  Tolerating medication: Yes  Family/Significant othe contact made: No, CSW assessing for appropriate contact Patient understands diagnosis: Yes AEB seeking help with depression and substance abuse Discussing patient identified problems/goals with staff: Yes  Medical problems stabilized or resolved: Yes  Denies suicidal/homicidal ideation: No, Pt recently admitted with SI Patient has not harmed self or Others: Yes   New problem(s) identified: None identified at this time.   Discharge Plan or Barriers: Pt considering rehab referral. Otherwise, will return home and follow-up with outpatient resources  Additional comments:  Patient and CSW reviewed pt's identified goals and treatment plan. Patient verbalized understanding and agreed to treatment plan. CSW reviewed [facility] "Discharge Process and Patient Involvement" Form. Pt verbalized understanding of information provided and signed form.   Reason for Continuation of Hospitalization:  Depression Medication stabilization Suicidal ideation  Estimated length of stay: 3-5 days  Review of initial/current patient goals per problem list:   1.  Goal(s): Patient will participate in aftercare plan  Met:  Progressing  Target date: 3-5 days from date of admission   As evidenced by: Patient will participate within aftercare plan AEB aftercare provider and housing plan at discharge being identified.  01/20/16: Pt considering rehab referral. Otherwise, will return home and follow-up with outpatient resources   2.  Goal (s): Patient will exhibit decreased depressive symptoms and suicidal ideations.  Met:  No  Target date: 3-5 days from date of admission   As evidenced by: Patient will utilize self rating of depression at 3 or below and  demonstrate decreased signs of depression or be deemed stable for discharge by MD. 01/20/16: Pt was admitted with symptoms of depression, rating 10/10. Pt continues to present with flat affect and depressive symptoms.  Pt will demonstrate decreased symptoms of depression and rate depression at 3/10 or lower prior to discharge.  4.  Goal(s): Patient will demonstrate decreased signs of withdrawal due to substance abuse  Met:  Yes  Target date: 3-5 days from date of admission   As evidenced by: Patient will produce a CIWA/COWS score of 0, have stable vitals signs, and no symptoms of withdrawal\   01/20/16: Pt has CIWA score of 1; only anxiety listed as symptom of withdrawal.  Attendees:  Patient:    Family:    Physician: Dr. Shea Evans, MD  01/20/2016 2:07 PM  Nursing: Lars Pinks, RN Case manager  01/20/2016 2:07 PM  Clinical Social Worker [name], [place] 01/20/2016 2:07 PM  Other:  01/20/2016 2:07 PM  Clinical:  Darrol Angel, RN' Gaylan Gerold, RN 01/20/2016 2:07 PM  Other: , RN Charge Nurse 01/20/2016 2:07 PM  Other: Hilda Lias, [place], [place] Work [phone]

## 2016-01-20 NOTE — BHH Suicide Risk Assessment (Signed)
[facility] Admission Suicide Risk Assessment   Nursing information obtained from:  Patient Demographic factors:  Low socioeconomic status, Unemployed Current Mental Status:  NA Loss Factors:  Financial problems / change in socioeconomic status Historical Factors:  Prior suicide attempts, Family history of mental illness or substance abuse Risk Reduction Factors:  Living with another person, especially a relative, Sense of responsibility to family, Responsible for children under 27 years of age, Religious beliefs about death  Total Time spent with patient: 30 minutes Principal Problem: Major depressive disorder, recurrent, severe without psychotic features ([place]) Diagnosis:   Patient Active Problem List   Diagnosis Date Noted  . Alcohol use disorder, severe, dependence ([place]) [F10.20] 01/20/2016  . Cocaine use disorder, severe, dependence ([place]) [F14.20] 01/20/2016  . 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 Chronic pain syndrome [G89.4] 01/20/2016  . Prolapsed internal hemorrhoids [K64.8] 06/08/2015  . Constipation [K59.00] 06/08/2015  . Major depressive disorder, recurrent, severe without psychotic features ([place]) [F33.2]    Subjective Data: Please see H&P.   Continued Clinical Symptoms:    The "Alcohol Use Disorders Identification Test", Guidelines for Use in Primary Care, Second Edition.  World Pharmacologist [facility]). Score between 0-7:  no or low risk or alcohol related problems. Score between 8-15:  moderate risk of alcohol related problems. Score between 16-19:  high risk of alcohol related problems. Score 20 or above:  warrants further diagnostic evaluation for alcohol dependence and treatment.   CLINICAL FACTORS:   Severe Anxiety and/or Agitation Alcohol/Substance Abuse/Dependencies Chronic Pain Previous Psychiatric Diagnoses and Treatments   Musculoskeletal: Strength & Muscle Tone: within normal limits Gait & Station: normal Patient leans:  N/A  Psychiatric Specialty Exam: ROS  Blood pressure 144/98, pulse 55, temperature 97.6 F (36.4 C), temperature source Oral, resp. rate 16, height 5\' 9"  (1.753 m), weight 73.936 kg (163 lb), SpO2 98 %.Body mass index is 24.06 kg/(m^2).  Please see H&P.                                                       COGNITIVE FEATURES THAT CONTRIBUTE TO RISK:  Closed-mindedness, Polarized thinking and Thought constriction (tunnel vision)    SUICIDE RISK:   Moderate:  Frequent suicidal ideation with limited intensity, and duration, some specificity in terms of plans, no associated intent, good self-control, limited dysphoria/symptomatology, some risk factors present, and identifiable protective factors, including available and accessible social support.  PLAN OF CARE: Please see H&P.   I certify that inpatient services furnished can reasonably be expected to improve the patient's condition.   Joby Richart, MD 01/20/2016, 12:26 PM

## 2016-01-20 NOTE — H&P (Signed)
Psychiatric Admission Assessment Adult  Patient Identification: Becky [name] MRN:  [id number] Date of Evaluation:  01/20/2016 Chief Complaint: Patient states " I want to get help with my substance abuse.'   Principal Diagnosis: Major depressive disorder, recurrent, severe without psychotic features ([place]) Diagnosis:   Patient Active Problem List   Diagnosis Date Noted  . Alcohol use disorder, severe, dependence ([place]) [F10.20] 01/20/2016  . Cocaine use disorder, severe, dependence ([place]) [F14.20] 01/20/2016  . 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 Chronic pain syndrome [G89.4] 01/20/2016  . Hepatitis C [B19.20] 01/20/2016  . Prolapsed internal hemorrhoids [K64.8] 06/08/2015  . Constipation [K59.00] 06/08/2015  . Major depressive disorder, recurrent, severe without psychotic features ([place]) [F33.2]        History of Present Illness:: Becky [name] is a 55 y.o. AA female, who is single, unemployed , who lives in [place] with her two children, has a hx of depression as well as polysubstance abuse , who presented to [facility] by [facility] for SI/HI.   As per initial notes in EHR " IVC paperwork stated that pt voiced plan to kill her sister then "turn the gas on herself and burn her ass up". Pt stated that she did not threaten her sister however, she called DSS and informed them that she was "tired". Pt stated that she communicated to social services that she was going to get some gas and burn her (sister) up. Pt communicated that she would never hurt her sister. Pt states "I threaten her every day but they don't know that. You can't be calling social services telling them that". Pt states she told this to DSS because she was "overwhelmed". Pt states "I just got overwhelmed because I don't have a job, I don't have a check, I don't have a vehicle". Pt reports financial difficulties. "Constantly calling me and fussing and picking" "It's a very bad feeling to have  a child that you can hardly take care of" In return, DSS contacted the authorities and removed her child from the home. When asked about any previous CPS involvement, pt responded that CPS "stays involved all the time". "   Patient seen and chart reviewed today .Discussed patient with treatment team. Pt today seen as depressed , but is calm and cooperative. Pt reports she wants to get help and wants to get back on her feet . Pt states that she wants to get a job and wants to give her children all they need. Pt states she understands that she has been abusing a lot of drugs. Pt reports that she abuses alcohol daily , uses 1-2 40 oz beer everyday , gets drunk. Pt also abuses cocaine - daily, a lot. Pt states that she also abuses BZD - borrowed it from a friend recently since she has severe back pain.Pt states she is willing to go to a substance abuse program to get help with her drug abuse problems.  Pt reports depression , lack of energy , sleep issues , as well as recent suicide attempt two days ago when she OD ed on trazodone 50 mg - 7 pills and motrin pills. Pt reports she feels fine now.  Pt reports she has been noncompliant with her antidepressant Zoloft. She is however willing to start treatment now.  Pt denies AH/VH/paranoia , other than being paranoid about her sister. She feels her sister is very mean and threatens to call social service on her often. However , she believes that her sister is  just trying to help out.  Pt denies hx of sexual or physical abuse.    Associated Signs/Symptoms: Depression Symptoms:  depressed mood, anhedonia, insomnia, fatigue, hopelessness, suicidal attempt, anxiety, loss of energy/fatigue, (Hypo) Manic Symptoms:  Impulsivity, Anxiety Symptoms:  anxiety sx on and off Psychotic Symptoms:  denies PTSD Symptoms: Negative Total Time spent with patient: 45 minutes  Past Psychiatric History: Pt was admitted at [facility] in the past - 02/12/15- 02/14/15. Pt has  a past hx of MDD and substance abuse. Pt is noncompliant with out patient treatment.  Is the patient at risk to self? Yes.    Has the patient been a risk to self in the past 6 months? Yes.    Has the patient been a risk to self within the distant past? Yes.    Is the patient a risk to others? Yes.    Has the patient been a risk to others in the past 6 months? Yes.    Has the patient been a risk to others within the distant past? Yes.      Alcohol Screening: 1. How often do you have a drink containing alcohol?: 4 or more times a week 2. How many drinks containing alcohol do you have on a typical day when you are drinking?: 3 or 4 3. How often do you have six or more drinks on one occasion?: Never Preliminary Score: 1 Brief Intervention: AUDIT score less than 7 or less-screening does not suggest unhealthy drinking-brief intervention not indicated Substance Abuse History in the last 12 months:  Yes.   Consequences of Substance Abuse: relational issues, admissions to hospital Previous Psychotropic Medications: Yes , zoloft , trazodone Psychological Evaluations: No  Past Medical History:  Past Medical History  Diagnosis Date  . Hypertension   . Bronchitis   . Headache   . Depression   . Cocaine abuse   . MDD (major depressive disorder) [facility])     Past Surgical History  Procedure Laterality Date  . Cysts removed from armpits bilaterally    . Colonoscopy with propofol N/A 06/23/2015    SLF: 1. Rectal bleeding/pain due to hemorrhoids2. one large and one small colorectal polyp removed.   . Polypectomy N/A 06/23/2015    Procedure: POLYPECTOMY;  Surgeon: Danie Binder, MD;  Location: [facility];  Service: Endoscopy;  Laterality: N/A;  descending colon   Family History:  Family History  Problem Relation Age of Onset  . Heart failure Mother   . Coronary artery disease Mother   . Heart failure Father   . Coronary artery disease Father   . Colon cancer Neg Hx   . Mental illness Neg Hx     Family Psychiatric  History: Pt denies hx of mental illness, substance abuse or suicide. Tobacco Screening:smoker , refused nicotine patch Social History: Pt is single, went up to 22 th grade,got her GED, is unemployed , has two children aged 66 and 6. Pt reports their aunt is taking care of them, DSS involved as per initial noted in EHR. History  Alcohol Use  . 12.0 oz/week  . 20 Cans of beer per week    Comment: 2 40 oz beer daily      History  Drug Use  . Yes  . Special: Cocaine, Benzodiazepines    Comment: cocaine last used 01/17/16    Additional Social History:  Allergies:  No Known Allergies Lab Results:  Results for orders placed or performed during the hospital encounter of 01/18/16 (from the past 48 hour(s))  Urine rapid drug screen (hosp performed)     Status: Abnormal   Collection Time: 01/18/16  9:35 PM  Result Value Ref Range   Opiates NONE DETECTED NONE DETECTED   Cocaine POSITIVE (A) NONE DETECTED   Benzodiazepines POSITIVE (A) NONE DETECTED   Amphetamines NONE DETECTED NONE DETECTED   Tetrahydrocannabinol NONE DETECTED NONE DETECTED   Barbiturates NONE DETECTED NONE DETECTED    Comment:        DRUG SCREEN FOR MEDICAL PURPOSES ONLY.  IF CONFIRMATION IS NEEDED FOR ANY PURPOSE, NOTIFY LAB WITHIN 5 DAYS.        LOWEST DETECTABLE LIMITS FOR URINE DRUG SCREEN Drug Class       Cutoff (ng/mL) Amphetamine      1000 Barbiturate      200 Benzodiazepine   409 Tricyclics       811 Opiates          300 Cocaine          300 THC              50   CBC with Differential     Status: None   Collection Time: 01/18/16 10:03 PM  Result Value Ref Range   WBC 5.3 4.0 - 10.5 K/uL   RBC 4.77 3.87 - 5.11 MIL/uL   Hemoglobin 13.9 12.0 - 15.0 g/dL   HCT 41.7 36.0 - 46.0 %   MCV 87.4 78.0 - 100.0 fL   MCH 29.1 26.0 - 34.0 pg   MCHC 33.3 30.0 - 36.0 g/dL   RDW 13.9 11.5 - 15.5 %   Platelets 180 150 - 400 K/uL   Neutrophils Relative %  38 %   Neutro Abs 2.0 1.7 - 7.7 K/uL   Lymphocytes Relative 53 %   Lymphs Abs 2.8 0.7 - 4.0 K/uL   Monocytes Relative 6 %   Monocytes Absolute 0.3 0.1 - 1.0 K/uL   Eosinophils Relative 2 %   Eosinophils Absolute 0.1 0.0 - 0.7 K/uL   Basophils Relative 1 %   Basophils Absolute 0.0 0.0 - 0.1 K/uL  Basic metabolic panel     Status: Abnormal   Collection Time: 01/18/16 10:03 PM  Result Value Ref Range   Sodium 143 135 - 145 mmol/L   Potassium 3.9 3.5 - 5.1 mmol/L   Chloride 114 (H) 101 - 111 mmol/L   CO2 21 (L) 22 - 32 mmol/L   Glucose, Bld 94 65 - 99 mg/dL   BUN 14 6 - 20 mg/dL   Creatinine, Ser 0.82 0.44 - 1.00 mg/dL   Calcium 8.6 (L) 8.9 - 10.3 mg/dL   GFR calc non Af Amer >60 >60 mL/min   GFR calc Af Amer >60 >60 mL/min    Comment: (NOTE) The eGFR has been calculated using the CKD EPI equation. This calculation has not been validated in all clinical situations. eGFR's persistently <60 mL/min signify possible Chronic Kidney Disease.    Anion gap 8 5 - 15  Ethanol     Status: Abnormal   Collection Time: 01/18/16 10:03 PM  Result Value Ref Range   Alcohol, Ethyl (B) 5 (H) <5 mg/dL    Comment:        LOWEST DETECTABLE LIMIT FOR SERUM ALCOHOL IS 5 mg/dL FOR MEDICAL PURPOSES ONLY   Acetaminophen level     Status: Abnormal  Collection Time: 01/18/16 10:03 PM  Result Value Ref Range   Acetaminophen (Tylenol), Serum <10 (L) 10 - 30 ug/mL    Comment:        THERAPEUTIC CONCENTRATIONS VARY SIGNIFICANTLY. A RANGE OF 10-30 ug/mL MAY BE AN EFFECTIVE CONCENTRATION FOR MANY PATIENTS. HOWEVER, SOME ARE BEST TREATED AT CONCENTRATIONS OUTSIDE THIS RANGE. ACETAMINOPHEN CONCENTRATIONS >150 ug/mL AT 4 HOURS AFTER INGESTION AND >50 ug/mL AT 12 HOURS AFTER INGESTION ARE OFTEN ASSOCIATED WITH TOXIC REACTIONS.   Salicylate level     Status: None   Collection Time: 01/18/16 10:03 PM  Result Value Ref Range   Salicylate Lvl <6.4 2.8 - 30.0 mg/dL    Blood Alcohol level:  Lab  Results  Component Value Date   ETH 5* 01/18/2016   ETH <5 33/29/5188    Metabolic Disorder Labs:  No results found for: HGBA1C, MPG No results found for: PROLACTIN No results found for: CHOL, TRIG, HDL, CHOLHDL, VLDL, LDLCALC  Current Medications: Current Facility-Administered Medications  Medication Dose Route Frequency Provider Last Rate Last Dose  . alum & mag hydroxide-simeth (MAALOX/MYLANTA) 200-200-20 MG/5ML suspension 30 mL  30 mL Oral Q4H PRN Derrill Center, NP      . chlordiazePOXIDE (LIBRIUM) capsule 25 mg  25 mg Oral Q6H PRN Ursula Alert, MD      . chlordiazePOXIDE (LIBRIUM) capsule 25 mg  25 mg Oral QID Ursula Alert, MD   25 mg at 01/20/16 1210   Followed by  . [START ON 01/21/2016] chlordiazePOXIDE (LIBRIUM) capsule 25 mg  25 mg Oral TID Ursula Alert, MD       Followed by  . [START ON 01/22/2016] chlordiazePOXIDE (LIBRIUM) capsule 25 mg  25 mg Oral BH-qamhs Ursula Alert, MD       Followed by  . [START ON 01/23/2016] chlordiazePOXIDE (LIBRIUM) capsule 25 mg  25 mg Oral Daily Daviona Herbert, MD      . DULoxetine (CYMBALTA) DR capsule 30 mg  30 mg Oral Daily Ursula Alert, MD   30 mg at 01/20/16 1209  . fluticasone (FLONASE) 50 MCG/ACT nasal spray 2 spray  2 spray Each Nare Daily Laverle Hobby, PA-C   2 spray at 01/20/16 4166  . hydrOXYzine (ATARAX/VISTARIL) tablet 25 mg  25 mg Oral Q6H PRN Ursula Alert, MD      . ibuprofen (ADVIL,MOTRIN) tablet 600 mg  600 mg Oral Q6H PRN Ursula Alert, MD   600 mg at 01/20/16 1209  . lidocaine (LIDODERM) 5 % 1 patch  1 patch Transdermal Daily Ursula Alert, MD   1 patch at 01/20/16 1209  . lisinopril (PRINIVIL,ZESTRIL) tablet 10 mg  10 mg Oral Daily Ursula Alert, MD   10 mg at 01/20/16 1209  . loperamide (IMODIUM) capsule 2-4 mg  2-4 mg Oral PRN Ursula Alert, MD      . magnesium hydroxide (MILK OF MAGNESIA) suspension 30 mL  30 mL Oral Daily PRN Derrill Center, NP      . multivitamin with minerals tablet 1 tablet  1 tablet  Oral Daily Ursula Alert, MD   1 tablet at 01/20/16 1209  . ondansetron (ZOFRAN-ODT) disintegrating tablet 4 mg  4 mg Oral Q6H PRN Ursula Alert, MD      . thiamine (B-1) injection 100 mg  100 mg Intramuscular Once Ursula Alert, MD   100 mg at 01/20/16 1210  . [START ON 01/21/2016] thiamine (VITAMIN B-1) tablet 100 mg  100 mg Oral Daily Ursula Alert, MD      .  traZODone (DESYREL) tablet 50 mg  50 mg Oral QHS,MR X 1 Derrill Center, NP   50 mg at 01/20/16 0520   PTA Medications: Prescriptions prior to admission  Medication Sig Dispense Refill Last Dose  . albuterol (PROVENTIL HFA;VENTOLIN HFA) 108 (90 BASE) MCG/ACT inhaler Inhale 2 puffs into the lungs every 4 (four) hours as needed for wheezing or shortness of breath. 1 Inhaler 1 01/18/2016 at Unknown time  . azithromycin (ZITHROMAX) 250 MG tablet Take 1 tablet (250 mg total) by mouth daily. Take one tablet daily starting 08/28/2015 (Patient not taking: Reported on 11/12/2015) 4 tablet 0 Not Taking  . HYDROcodone-acetaminophen (NORCO/VICODIN) 5-325 MG tablet Take 1 tablet by mouth every 6 (six) hours as needed for moderate pain. (Patient not taking: Reported on 01/19/2016) 20 tablet 0 Not Taking  . Linaclotide (LINZESS) 290 MCG CAPS capsule Take 1 capsule (290 mcg total) by mouth daily. 30 minutes before breakfast 30 capsule 11 01/18/2016 at Unknown time  . naproxen (NAPROSYN) 375 MG tablet Take 1 po BID with food prn pain (Patient not taking: Reported on 11/12/2015) 30 tablet 0 Not Taking  . predniSONE (DELTASONE) 10 MG tablet Take 4 tablets (40 mg total) by mouth daily. (Patient not taking: Reported on 11/12/2015) 16 tablet 0 Not Taking    Musculoskeletal: Strength & Muscle Tone: within normal limits Gait & Station: normal Patient leans: N/A  Psychiatric Specialty Exam: Physical Exam  Nursing note and vitals reviewed. Constitutional:  I concur with PE done in ED.    Review of Systems  Musculoskeletal: Positive for back pain.   Psychiatric/Behavioral: Positive for depression, suicidal ideas and substance abuse. The patient is nervous/anxious and has insomnia.   All other systems reviewed and are negative.   Blood pressure 144/98, pulse 55, temperature 97.6 F (36.4 C), temperature source Oral, resp. rate 16, height '5\' 9"'$  (1.753 m), weight 73.936 kg (163 lb), SpO2 98 %.Body mass index is 24.06 kg/(m^2).  General Appearance: Casual  Eye Contact::  Fair  Speech:  Clear and Coherent  Volume:  Normal  Mood:  Anxious and Depressed  Affect:  Congruent  Thought Process:  Goal Directed  Orientation:  Full (Time, Place, and Person)  Thought Content:  Rumination  Suicidal Thoughts:  S/P suicide attempt , currently denies  Homicidal Thoughts:  threatened to hurt sister on admission, currently denies  Memory:  Immediate;   Fair Recent;   Fair Remote;   Fair  Judgement:  Impaired  Insight:  Shallow  Psychomotor Activity:  Normal  Concentration:  Poor  Recall:  [employer] of Knowledge:Fair  Language: Fair  Akathisia:  No  Handed:  Right  AIMS (if indicated):     Assets:  Communication Skills  ADL's:  Intact  Cognition: WNL  Sleep:  Number of Hours: 6.75     Treatment Plan Summary:Becky [name] is a 55 y.o. AA female, who is single, unemployed , who lives in [place] with her two children, has a hx of depression as well as polysubstance abuse , who presented to [facility] by [employer] for SI/HI.Pt continues to be depressed, is S/P suicide attempt. Will start treatment.   Daily contact with patient to assess and evaluate symptoms and progress in treatment and Medication management   Patient will benefit from inpatient treatment and stabilization.  Estimated length of stay is 5-7 days.  Reviewed past medical records,treatment plan.  Will start a trial of Cymbalta 30 mg po daily for affective sx. Will continue Trazodone  50 mg po qhs for sleep. Will make available PRN medications as per agitation  protocol. Will start CIWA /librium protocol.  Patient is very motivated to get help with her substance abuse problems - CSW to refer pt to rehab . Pt to attend substance abuse groups on [address] if she agrees.  Will continue to monitor vitals ,medication compliance and treatment side effects while patient is here.  Will monitor for medical issues as well as call consult as needed.  Reviewed labs UDS positive for BZD, cocaine, BAL 5, cbc , cmp - wnl  ,will order  EKG since she reports OD in on trazodone , will get TSH.Will also get Hepatitis panel, LFTs as well as hep c rna quant. CSW will start working on disposition.  Patient to participate in therapeutic milieu .       Observation Level/Precautions:  15 minute checks    Psychotherapy:  Individual and group therapy     Consultations:  Social worker  Discharge Concerns:  Stability Aldean Jewett       I certify that inpatient services furnished can reasonably be expected to improve the patient's condition.    Jamye Balicki, MD 3/1/20171:00 PM

## 2016-01-20 NOTE — Progress Notes (Signed)
Adult Psychoeducational Group Note  Date:  01/20/2016 Time:  9:16 PM  Group Topic/Focus:  Wrap-Up Group:   The focus of this group is to help patients review their daily goal of treatment and discuss progress on daily workbooks.  Participation Level:  Active  Participation Quality:  Appropriate  Affect:  Appropriate  Cognitive:  Alert  Insight: Appropriate  Engagement in Group:  Engaged  Modes of Intervention:  Discussion  Additional Comments:  Patient goal for today was to feel better. On a scale between 1-10, (1=worse, 10=best) patient rated her day a 8.  Maurie Olesen L Taji Sather 01/20/2016, 9:16 PM

## 2016-01-20 NOTE — BHH Group Notes (Signed)
[facility] LCSW Aftercare Discharge Planning Group Note  01/20/2016 8:45 AM  Participation Quality: Alert, Appropriate and Oriented  Mood/Affect: Flat  Depression Rating: "fine"  Anxiety Rating: "fine"  Thoughts of Suicide: Pt denies SI/HI  Will you contract for safety? Yes  Current AVH: Pt denies  Plan for Discharge/Comments: Pt attended discharge planning group and actively participated in group. CSW discussed suicide prevention education with the group and encouraged them to discuss discharge planning and any relevant barriers. Pt was reserved in group and forwarded minimal information.   Transportation Means: Pt reports access to transportation  Supports: No supports mentioned at this time  [name], [place] 01/20/2016 2:13 PM

## 2016-01-21 LAB — HEPATIC FUNCTION PANEL
ALBUMIN: 3.9 g/dL (ref 3.5–5.0)
ALT: 22 U/L (ref 14–54)
AST: 20 U/L (ref 15–41)
Alkaline Phosphatase: 53 U/L (ref 38–126)
TOTAL PROTEIN: 6.9 g/dL (ref 6.5–8.1)
Total Bilirubin: 0.3 mg/dL (ref 0.3–1.2)

## 2016-01-21 LAB — TSH: TSH: 0.692 u[IU]/mL (ref 0.350–4.500)

## 2016-01-21 NOTE — Progress Notes (Signed)
[facility] MD Progress Note  01/21/2016 1:45 PM Becky [name]  MRN:  [id number] Subjective: Pt states " I am better.'  Objective:Becky [name] is a 55 y.o. AA female, who is single, unemployed , who lives in [place] with her two children, has a hx of depression as well as polysubstance abuse , who presented to [facility] by [place] department for SI/HI.  Patient seen and chart reviewed.Discussed patient with treatment team.  Pt today seen as less depressed, denies any new concerns. Pt is on CIWA, reviewed score as well as VS. Pt denies any withdrawal sx. Pt continues to be motivated to get help with her substance abuse problems, Pt continues to participate in groups and has been going to [facility] for substance abuse groups.    Principal Problem: Major depressive disorder, recurrent, severe without psychotic features ([place]) Diagnosis:   Patient Active Problem List   Diagnosis Date Noted  . Alcohol use disorder, severe, dependence ([place]) [F10.20] 01/20/2016  . Cocaine use disorder, severe, dependence ([place]) [F14.20] 01/20/2016  . 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 Chronic pain syndrome [G89.4] 01/20/2016  . Hepatitis C [B19.20] 01/20/2016  . Prolapsed internal hemorrhoids [K64.8] 06/08/2015  . Constipation [K59.00] 06/08/2015  . Major depressive disorder, recurrent, severe without psychotic features ([place]) [F33.2]    Total Time spent with patient: 30 minutes  Past Psychiatric History: Pt was admitted at [facility] in the past - 02/12/15- 02/14/15. Pt has a past hx of MDD and substance abuse. Pt is noncompliant with out patient treatment.  Past Medical History:  Past Medical History  Diagnosis Date  . Hypertension   . Bronchitis   . Headache   . Depression   . Cocaine abuse   . MDD (major depressive disorder) [facility])     Past Surgical History  Procedure Laterality Date  . Cysts removed from armpits bilaterally    . Colonoscopy with propofol N/A 06/23/2015    SLF: 1.  Rectal bleeding/pain due to hemorrhoids2. one large and one small colorectal polyp removed.   . Polypectomy N/A 06/23/2015    Procedure: POLYPECTOMY;  Surgeon: Danie Binder, MD;  Location: [facility];  Service: Endoscopy;  Laterality: N/A;  descending colon   Family History:  Family History  Problem Relation Age of Onset  . Heart failure Mother   . Coronary artery disease Mother   . Heart failure Father   . Coronary artery disease Father   . Colon cancer Neg Hx   . Mental illness Neg Hx    Family Psychiatric  History: Pt denies hx of mental illness, substance abuse or suicide.  Social History: Pt is single, went up to 32 th grade,got her GED, is unemployed , has two children aged 87 and 27. Pt reports their aunt is taking care of them, DSS involved as per initial noted in EHR.  History  Alcohol Use  . 12.0 oz/week  . 20 Cans of beer per week    Comment: 2 40 oz beer daily      History  Drug Use  . Yes  . Special: Cocaine, Benzodiazepines    Comment: cocaine last used 01/17/16    Social History   Social History  . Marital Status: Single    Spouse Name: N/A  . Number of Children: N/A  . Years of Education: N/A   Social History Main Topics  . Smoking status: Current Some Day Smoker -- 0.00 packs/day    Types: Cigarettes  . Smokeless tobacco: Never  Used     Comment: vague reports of smoking intermittently   . Alcohol Use: 12.0 oz/week    20 Cans of beer per week     Comment: 2 40 oz beer daily   . Drug Use: Yes    Special: Cocaine, Benzodiazepines     Comment: cocaine last used 01/17/16  . Sexual Activity: Yes   Other Topics Concern  . None   Social History Narrative   Additional Social History:                         Sleep: Fair  Appetite:  Fair  Current Medications: Current Facility-Administered Medications  Medication Dose Route Frequency Provider Last Rate Last Dose  . alum & mag hydroxide-simeth (MAALOX/MYLANTA) 200-200-20 MG/5ML suspension 30  mL  30 mL Oral Q4H PRN Derrill Center, NP      . chlordiazePOXIDE (LIBRIUM) capsule 25 mg  25 mg Oral Q6H PRN Ursula Alert, MD      . chlordiazePOXIDE (LIBRIUM) capsule 25 mg  25 mg Oral TID Ursula Alert, MD   25 mg at 01/21/16 1205   Followed by  . [START ON 01/22/2016] chlordiazePOXIDE (LIBRIUM) capsule 25 mg  25 mg Oral BH-qamhs Ursula Alert, MD       Followed by  . [START ON 01/23/2016] chlordiazePOXIDE (LIBRIUM) capsule 25 mg  25 mg Oral Daily Dina Warbington, MD      . DULoxetine (CYMBALTA) DR capsule 30 mg  30 mg Oral Daily Ursula Alert, MD   30 mg at 01/21/16 0747  . fluticasone (FLONASE) 50 MCG/ACT nasal spray 2 spray  2 spray Each Nare Daily Laverle Hobby, PA-C   2 spray at 01/21/16 [id number]  . hydrOXYzine (ATARAX/VISTARIL) tablet 25 mg  25 mg Oral Q6H PRN Ursula Alert, MD      . ibuprofen (ADVIL,MOTRIN) tablet 600 mg  600 mg Oral Q6H PRN Ursula Alert, MD   600 mg at 01/21/16 0744  . lidocaine (LIDODERM) 5 % 1 patch  1 patch Transdermal Daily Ursula Alert, MD   1 patch at 01/21/16 0746  . lisinopril (PRINIVIL,ZESTRIL) tablet 10 mg  10 mg Oral Daily Ursula Alert, MD   10 mg at 01/21/16 0748  . loperamide (IMODIUM) capsule 2-4 mg  2-4 mg Oral PRN Ursula Alert, MD      . magnesium hydroxide (MILK OF MAGNESIA) suspension 30 mL  30 mL Oral Daily PRN Derrill Center, NP   30 mL at 01/20/16 1714  . multivitamin with minerals tablet 1 tablet  1 tablet Oral Daily Ursula Alert, MD   1 tablet at 01/21/16 0748  . ondansetron (ZOFRAN-ODT) disintegrating tablet 4 mg  4 mg Oral Q6H PRN Ursula Alert, MD      . thiamine (B-1) injection 100 mg  100 mg Intramuscular Once Ursula Alert, MD   100 mg at 01/20/16 1210  . thiamine (VITAMIN B-1) tablet 100 mg  100 mg Oral Daily Ursula Alert, MD   100 mg at 01/21/16 0748  . traZODone (DESYREL) tablet 50 mg  50 mg Oral QHS,MR X 1 Derrill Center, NP   50 mg at 01/20/16 [id number]    Lab Results:  Results for orders placed or performed during the  hospital encounter of 01/19/16 (from the past 48 hour(s))  Hepatic function panel     Status: Abnormal   Collection Time: 01/21/16  6:20 AM  Result Value Ref Range   Total Protein 6.9 6.5 -  8.1 g/dL   Albumin 3.9 3.5 - 5.0 g/dL   AST 20 15 - 41 U/L   ALT 22 14 - 54 U/L   Alkaline Phosphatase 53 38 - 126 U/L   Total Bilirubin 0.3 0.3 - 1.2 mg/dL   Bilirubin, Direct <0.1 (L) 0.1 - 0.5 mg/dL   Indirect Bilirubin NOT CALCULATED 0.3 - 0.9 mg/dL    Comment: Performed at [facility]  TSH     Status: None   Collection Time: 01/21/16  6:20 AM  Result Value Ref Range   TSH 0.692 0.350 - 4.500 uIU/mL    Comment: Performed at [facility]    Blood Alcohol level:  Lab Results  Component Value Date   [facility] 5* 01/18/2016   ETH <5 02/12/2015    Physical Findings: AIMS: Facial and Oral Movements Muscles of Facial Expression: None, normal Lips and Perioral Area: None, normal Jaw: None, normal Tongue: None, normal,Extremity Movements Upper (arms, wrists, hands, fingers): None, normal Lower (legs, knees, ankles, toes): None, normal, Trunk Movements Neck, shoulders, hips: None, normal, Overall Severity Severity of abnormal movements (highest score from questions above): None, normal Incapacitation due to abnormal movements: None, normal Patient's awareness of abnormal movements (rate only patient's report): No Awareness, Dental Status Current problems with teeth and/or dentures?: No Does patient usually wear dentures?: No  CIWA:  CIWA-Ar Total: 1 COWS:     Musculoskeletal: Strength & Muscle Tone: within normal limits Gait & Station: normal Patient leans: N/A  Psychiatric Specialty Exam: Review of Systems  Psychiatric/Behavioral: Positive for depression and substance abuse. The patient is nervous/anxious.   All other systems reviewed and are negative.   Blood pressure 125/82, pulse 50, temperature 97.8 F (36.6 C), temperature source Oral, resp.  rate 20, height 5\' 9"  (1.753 m), weight 73.936 kg (163 lb), SpO2 98 %.Body mass index is 24.06 kg/(m^2).  General Appearance: Fairly Groomed  Engineer, water::  Minimal  Speech:  Slow  Volume:  Normal  Mood:  Anxious and Depressed  Affect:  Depressed  Thought Process:  Coherent  Orientation:  Full (Time, Place, and Person)  Thought Content:  Rumination  Suicidal Thoughts:  No  Homicidal Thoughts:  No  Memory:  Immediate;   Fair Recent;   Fair Remote;   Fair  Judgement:  Impaired  Insight:  Shallow  Psychomotor Activity:  Restlessness  Concentration:  Fair  Recall:  Poor  Fund of Knowledge:Fair  Language: Fair  Akathisia:  No  Handed:  Right  AIMS (if indicated):     Assets:  Desire for Improvement  ADL's:  Intact  Cognition: WNL  Sleep:  Number of Hours: 6.75   Treatment Plan Summary:Becky [name] is a 55 y.o. AA female, who is single, unemployed , who lives in [place] with her two children, has a hx of depression as well as polysubstance abuse , who presented to [facility] for SI/HI. Pt continues to progress. Will continue treatment.  Daily contact with patient to assess and evaluate symptoms and progress in treatment and Medication management  Will continue Cymbalta 30 mg po daily for affective sx. Will continue Trazodone 50 mg po qhs for sleep. Will make available PRN medications as per agitation protocol. Will continue CIWA /librium protocol.  Patient continues to be motivated to get help for her substance abuse problems - CSW to refer pt to rehab . Pt to attend substance abuse groups on [facility] if she agrees.  Will continue to  monitor vitals ,medication compliance and treatment side effects while patient is here.  Will monitor for medical issues as well as call consult as needed.  Reviewed labs UDS positive for BZD, cocaine, BAL 5, cbc , cmp - wnl , EKG since she reports OD in on trazodone- qtc wnl  ,  TSH- wnl, LFT - wnl  .Pending   Hepatitis panel, hep c rna quant. CSW will start working on disposition.  Patient to participate in therapeutic milieu .     Becky Sky, MD 01/21/2016, 1:45 PM

## 2016-01-21 NOTE — Progress Notes (Signed)
DAR NOTE: Patient presents with anxious affect and depressed mood.  Denies auditory and visual hallucinations.  Rates depression at 7, hopelessness at 7, and anxiety at 7.  Maintained on routine safety checks.  Medications given as prescribed.  Support and encouragement offered as needed.  Attended group and participated.  States goal for today is "temper and getting well."  Patient observed socializing with peers in the dayroom.  Offered no complaint.

## 2016-01-21 NOTE — [facility] Group Notes (Signed)
[facility] Group Therapy 01/21/2016 1:15pm  Type of Therapy: Group Therapy- Balance in Life  Participation Level: Active   Description of the Group:  The topic for group was balance in life. Today's group focused on defining balance in one's own words, identifying things that can knock one off balance, and exploring healthy ways to maintain balance in life. Group members were asked to provide an example of a time when they felt off balance, describe how they handled that situation,and process healthier ways to regain balance in the future. Group members were asked to share the most important tool for maintaining balance that they learned while at [facility] and how they plan to apply this method after discharge.  Summary of Patient Progress Pt chose a picture of two frogs as a representation of balance. She expressed that having a friend or support group, particularly someone who will love her is necessary for balance in her life. Pt expressed that she has this love in her life.    Therapeutic Modalities:   Cognitive Behavioral Therapy Solution-Focused Therapy Assertiveness Training   [name], [place] 01/21/2016 1:47 PM

## 2016-01-21 NOTE — Progress Notes (Signed)
D: Pt is alert and oriented x4. Pt endorses moderate depression of 5 on a 0-10 depression scale. She states, "I can't believe I tried to hurt myself; I am useless, got no money, got no job." Pt also endorses moderate anxiety of 5 on a 0-10 anxiety scale; she states, "My anxiety is about a 5 at the moment, this is the best I felt in sometime." Pt also complained of moderate chronic back pain; states, "all I need now is some rest and I should be fine." Pt however, denies AVH and SI/HI. Pt is very flat. Pt remained calm and cooperative through the shift assessment. A: Pt was encouraged to attend group. Medications administered as prescribed.  Support, encouragement, and safe environment provided.  15-minute safety checks continue. R: Pt was med compliant.  Pt attended wrap-up group. Safety checks continue.

## 2016-01-21 NOTE — Progress Notes (Signed)
Adult Psychoeducational Group Note  Date:  01/21/2016 Time:  8:55 PM  Group Topic/Focus:  Wrap-Up Group:   The focus of this group is to help patients review their daily goal of treatment and discuss progress on daily workbooks.  Participation Level:  Active  Participation Quality:  Appropriate  Affect:  Appropriate  Cognitive:  Appropriate  Insight: Appropriate  Engagement in Group:  Engaged  Modes of Intervention:  Discussion  Additional Comments: The patient expressed that she rates her day a 8 which is good.The patient also said that she attended group.  Nash Shearer 01/21/2016, 8:55 PM

## 2016-01-22 LAB — HEPATITIS PANEL, ACUTE
HCV Ab: 2.6 s/co ratio — ABNORMAL HIGH (ref 0.0–0.9)
HEP B C IGM: NEGATIVE
HEP B S AG: NEGATIVE
Hep A IgM: NEGATIVE

## 2016-01-22 LAB — HCV RNA QUANT: HCV Quantitative: NOT DETECTED IU/mL (ref >50)

## 2016-01-22 MED ORDER — GUAIFENESIN ER 600 MG PO TB12
600.0000 mg | ORAL_TABLET | Freq: Two times a day (BID) | ORAL | Status: DC | PRN
  Administered 2016-01-22: 600 mg via ORAL
  Filled 2016-01-22: qty 1

## 2016-01-22 MED ORDER — DULOXETINE HCL 30 MG PO CPEP
30.0000 mg | ORAL_CAPSULE | Freq: Two times a day (BID) | ORAL | Status: DC
  Administered 2016-01-22 – 2016-01-25 (×6): 30 mg via ORAL
  Filled 2016-01-22 (×9): qty 1

## 2016-01-22 NOTE — Progress Notes (Signed)
D: Pt is alert and oriented x4. Pt continues endorse moderate depression and moderate anxiety of; she states, "I don't know where all this is going to end." Pt also complained of mild chronic back pain; states, "all I need now is some rest and I should be fine." Pt however, denies AVH and SI/HI. Pt is very flat. Pt remained calm and cooperative through the shift assessment. A: Pt was encouraged to attend group. Medications administered as prescribed.  Support, encouragement, and safe environment provided.  15-minute safety checks continue. R: Pt refused scheduled sleep medication; states, "I can sleep without it."  Pt attended wrap-up group. Safety checks continue.

## 2016-01-22 NOTE — Progress Notes (Signed)
[facility] MD Progress Note  01/22/2016 1:37 PM Becky [name]  MRN:  [id number] Subjective: Pt states " I am just anxious and home sick.'  Objective:Becky [name] is a 55 y.o. AA female, who is single, unemployed , who lives in [place] with her two children, has a hx of depression as well as polysubstance abuse , who presented to [facility] by [place] department for SI/HI.  Patient seen and chart reviewed.Discussed patient with treatment team.  Pt today seen as anxious , sad, states she is worried about what is going to happen next and about her children. Pt continues to deny any withdrawal sx. Pt is on CIWA, reviewed score as well as VS. Pt denies any withdrawal sx. Pt continues to be motivated to get help with her substance abuse problems,CSW has been discussing several options for continued care with patient. Pt reports she has not been able to make up her mid yet about what she wants. Pt continues to participate in groups .   Principal Problem: Major depressive disorder, recurrent, severe without psychotic features ([place]) Diagnosis:   Patient Active Problem List   Diagnosis Date Noted  . Alcohol use disorder, severe, dependence ([place]) [F10.20] 01/20/2016  . Cocaine use disorder, severe, dependence ([place]) [F14.20] 01/20/2016  . 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 Chronic pain syndrome [G89.4] 01/20/2016  . Hepatitis C [B19.20] 01/20/2016  . Prolapsed internal hemorrhoids [K64.8] 06/08/2015  . Constipation [K59.00] 06/08/2015  . Major depressive disorder, recurrent, severe without psychotic features ([place]) [F33.2]    Total Time spent with patient: 30 minutes  Past Psychiatric History: Pt was admitted at [name] in the past - 02/12/15- 02/14/15. Pt has a past hx of MDD and substance abuse. Pt is noncompliant with out patient treatment.  Past Medical History:  Past Medical History  Diagnosis Date  . Hypertension   . Bronchitis   . Headache   . Depression   .  Cocaine abuse   . MDD (major depressive disorder) [facility])     Past Surgical History  Procedure Laterality Date  . Cysts removed from armpits bilaterally    . Colonoscopy with propofol N/A 06/23/2015    SLF: 1. Rectal bleeding/pain due to hemorrhoids2. one large and one small colorectal polyp removed.   . Polypectomy N/A 06/23/2015    Procedure: POLYPECTOMY;  Surgeon: Danie Binder, MD;  Location: [facility];  Service: Endoscopy;  Laterality: N/A;  descending colon   Family History:  Family History  Problem Relation Age of Onset  . Heart failure Mother   . Coronary artery disease Mother   . Heart failure Father   . Coronary artery disease Father   . Colon cancer Neg Hx   . Mental illness Neg Hx    Family Psychiatric  History: Pt denies hx of mental illness, substance abuse or suicide.  Social History: Pt is single, went up to 31 th grade,got her GED, is unemployed , has two children aged 52 and 89. Pt reports their aunt is taking care of them, DSS involved as per initial noted in EHR.  History  Alcohol Use  . 12.0 oz/week  . 20 Cans of beer per week    Comment: 2 40 oz beer daily      History  Drug Use  . Yes  . Special: Cocaine, Benzodiazepines    Comment: cocaine last used 01/17/16    Social History   Social History  . Marital Status: Single    Spouse Name: N/A  .  Number of Children: N/A  . Years of Education: N/A   Social History Main Topics  . Smoking status: Current Some Day Smoker -- 0.00 packs/day    Types: Cigarettes  . Smokeless tobacco: Never Used     Comment: vague reports of smoking intermittently   . Alcohol Use: 12.0 oz/week    20 Cans of beer per week     Comment: 2 40 oz beer daily   . Drug Use: Yes    Special: Cocaine, Benzodiazepines     Comment: cocaine last used 01/17/16  . Sexual Activity: Yes   Other Topics Concern  . None   Social History Narrative   Additional Social History:                         Sleep: Fair  Appetite:   Fair  Current Medications: Current Facility-Administered Medications  Medication Dose Route Frequency Provider Last Rate Last Dose  . alum & mag hydroxide-simeth (MAALOX/MYLANTA) 200-200-20 MG/5ML suspension 30 mL  30 mL Oral Q4H PRN Becky Center, NP      . chlordiazePOXIDE (LIBRIUM) capsule 25 mg  25 mg Oral Q6H PRN Becky Alert, MD      . chlordiazePOXIDE (LIBRIUM) capsule 25 mg  25 mg Oral BH-qamhs Becky Inghram, MD   25 mg at 01/22/16 0752   Followed by  . [START ON 01/23/2016] chlordiazePOXIDE (LIBRIUM) capsule 25 mg  25 mg Oral Daily Becky Trzcinski, MD      . DULoxetine (CYMBALTA) DR capsule 30 mg  30 mg Oral BID Becky Alert, MD      . fluticasone (FLONASE) 50 MCG/ACT nasal spray 2 spray  2 spray Each Nare Daily Becky Hobby, PA-C   2 spray at 01/22/16 [phone]  . guaiFENesin (MUCINEX) 12 hr tablet 600 mg  600 mg Oral BID PRN Becky Alert, MD   600 mg at 01/22/16 1012  . hydrOXYzine (ATARAX/VISTARIL) tablet 25 mg  25 mg Oral Q6H PRN Becky Alert, MD      . ibuprofen (ADVIL,MOTRIN) tablet 600 mg  600 mg Oral Q6H PRN Becky Alert, MD   600 mg at 01/22/16 0744  . lidocaine (LIDODERM) 5 % 1 patch  1 patch Transdermal Daily Becky Alert, MD   1 patch at 01/22/16 0746  . lisinopril (PRINIVIL,ZESTRIL) tablet 10 mg  10 mg Oral Daily Becky Alert, MD   10 mg at 01/22/16 0751  . loperamide (IMODIUM) capsule 2-4 mg  2-4 mg Oral PRN Becky Alert, MD      . magnesium hydroxide (MILK OF MAGNESIA) suspension 30 mL  30 mL Oral Daily PRN Becky Center, NP   30 mL at 01/20/16 1714  . multivitamin with minerals tablet 1 tablet  1 tablet Oral Daily Becky Alert, MD   1 tablet at 01/22/16 0751  . ondansetron (ZOFRAN-ODT) disintegrating tablet 4 mg  4 mg Oral Q6H PRN Becky Alert, MD      . thiamine (B-1) injection 100 mg  100 mg Intramuscular Once Becky Alert, MD   100 mg at 01/20/16 1210  . thiamine (VITAMIN B-1) tablet 100 mg  100 mg Oral Daily Becky Alert, MD   100 mg at  01/22/16 0751  . traZODone (DESYREL) tablet 50 mg  50 mg Oral QHS,MR X 1 Becky Center, NP   50 mg at 01/20/16 [id number]    Lab Results:  Results for orders placed or performed during the hospital encounter of 01/19/16 (from the  past 48 hour(s))  Hepatitis panel, acute     Status: Abnormal   Collection Time: 01/21/16  6:20 AM  Result Value Ref Range   Hepatitis B Surface Ag Negative Negative   HCV Ab 2.6 (H) 0.0 - 0.9 s/co ratio    Comment: (NOTE)                                  Negative:     < 0.8                             Indeterminate: 0.8 - 0.9                                  Positive:     > 0.9 The CDC recommends that a positive HCV antibody result be followed up with a HCV Nucleic Acid Amplification test [id number]). Performed At: [name] [place], [place] [id number] Lindon Romp MD [id number]    Hep A IgM Negative Negative   Hep B C IgM Negative Negative    Comment: Performed at [facility]  Hepatic function panel     Status: Abnormal   Collection Time: 01/21/16  6:20 AM  Result Value Ref Range   Total Protein 6.9 6.5 - 8.1 g/dL   Albumin 3.9 3.5 - 5.0 g/dL   AST 20 15 - 41 U/L   ALT 22 14 - 54 U/L   Alkaline Phosphatase 53 38 - 126 U/L   Total Bilirubin 0.3 0.3 - 1.2 mg/dL   Bilirubin, Direct <0.1 (L) 0.1 - 0.5 mg/dL   Indirect Bilirubin NOT CALCULATED 0.3 - 0.9 mg/dL    Comment: Performed at [facility]  TSH     Status: None   Collection Time: 01/21/16  6:20 AM  Result Value Ref Range   TSH 0.692 0.350 - 4.500 uIU/mL    Comment: Performed at [facility]    Blood Alcohol level:  Lab Results  Component Value Date   [facility] 5* 01/18/2016   ETH <5 02/12/2015    Physical Findings: AIMS: Facial and Oral Movements Muscles of Facial Expression: None, normal Lips and Perioral Area: None, normal Jaw: None, normal Tongue: None, normal,Extremity Movements Upper (arms, wrists,  hands, fingers): None, normal Lower (legs, knees, ankles, toes): None, normal, Trunk Movements Neck, shoulders, hips: None, normal, Overall Severity Severity of abnormal movements (highest score from questions above): None, normal Incapacitation due to abnormal movements: None, normal Patient's awareness of abnormal movements (rate only patient's report): No Awareness, Dental Status Current problems with teeth and/or dentures?: No Does patient usually wear dentures?: No  CIWA:  CIWA-Ar Total: 1 COWS:     Musculoskeletal: Strength & Muscle Tone: within normal limits Gait & Station: normal Patient leans: N/A  Psychiatric Specialty Exam: Review of Systems  Respiratory: Positive for cough.   Psychiatric/Behavioral: Positive for depression and substance abuse. The patient is nervous/anxious.   All other systems reviewed and are negative.   Blood pressure 132/87, pulse 55, temperature 97.8 F (36.6 C), temperature source Oral, resp. rate 16, height 5\' 9"  (1.753 m), weight 73.936 kg (163 lb), SpO2 98 %.Body mass index is 24.06 kg/(m^2).  General Appearance: Fairly Groomed  Engineer, water::  Minimal  Speech:  Slow  Volume:  Normal  Mood:  Anxious and Depressed  Affect:  Depressed  Thought Process:  Coherent  Orientation:  Full (Time, Place, and Person)  Thought Content:  Rumination  Suicidal Thoughts:  No  Homicidal Thoughts:  No  Memory:  Immediate;   Fair Recent;   Fair Remote;   Fair  Judgement:  Impaired  Insight:  Shallow  Psychomotor Activity:  Restlessness  Concentration:  Fair  Recall:  Poor  Fund of Knowledge:Fair  Language: Fair  Akathisia:  No  Handed:  Right  AIMS (if indicated):     Assets:  Desire for Improvement  ADL's:  Intact  Cognition: WNL  Sleep:  Number of Hours: 6.5   Treatment Plan Summary:Becky [name] is a 55 y.o. AA female, who is single, unemployed , who lives in [place] with her two children, has a hx of depression as well as polysubstance  abuse , who presented to [facility] by [employer] for SI/HI. Pt continues to be depressed, anxious , although making progress . Will continue treatment.  Daily contact with patient to assess and evaluate symptoms and progress in treatment and Medication management  Will increase Cymbalta to 30 mg po BID for affective sx. Will continue Trazodone 50 mg po qhs for sleep. Will make available PRN medications as per agitation protocol. Will continue CIWA /librium protocol.  Patient continues to be motivated to get help for her substance abuse problems- CSW has been working with her for the same.  Will continue to monitor vitals ,medication compliance and treatment side effects while patient is here.  Will monitor for medical issues as well as call consult as needed.  Reviewed labs UDS positive for BZD, cocaine, BAL 5, cbc , cmp - wnl , EKG since she reports OD in on trazodone- qtc wnl  ,  TSH- wnl, LFT - wnl  Hep C virus ab - 2.6 , pending hep c rna quant. CSW will continue working on disposition.Likely discharge in 2-3 days based on progress. Patient to participate in therapeutic milieu .     Zariana Strub, MD 01/22/2016, 1:37 PM

## 2016-01-22 NOTE — B[facility] Group Notes (Signed)
[place] LCSW Group Therapy  01/22/2016 1:15pm  Type of Therapy:  Group Therapy vercoming Obstacles  Participation Level:  Active  Participation Quality:  Distracting  Affect:  Appropriate  Cognitive:  Appropriate and Oriented  Insight:  Limited  Engagement in Therapy:  Improving  Modes of Intervention:  Discussion, Exploration, Problem-solving and Support  Description of Group:   In this group patients will be encouraged to explore what they see as obstacles to their own wellness and recovery. They will be guided to discuss their thoughts, feelings, and behaviors related to these obstacles. The group will process together ways to cope with barriers, with attention given to specific choices patients can make. Each patient will be challenged to identify changes they are motivated to make in order to overcome their obstacles. This group will be process-oriented, with patients participating in exploration of their own experiences as well as giving and receiving support and challenge from other group members.  Summary of Patient Progress: Pt observed to be laughing often throughout group and engaging in side conversations even after attempts to redirect her. She expressed that "being settled at home and not using" is her biggest obstacle. She has difficulty identifying specific tools she can use to overcome these obstacles.   Therapeutic Modalities:   Cognitive Behavioral Therapy Solution Focused Therapy Motivational Interviewing Relapse Prevention Therapy   [name], LCSWA 01/22/2016 2:05 PM

## 2016-01-22 NOTE — BHH Suicide Risk Assessment (Signed)
[facility] INPATIENT:  Family/Significant Other Suicide Prevention Education  Suicide Prevention Education:  Education Completed; Milus Height, Pt's sister [phone], has been identified by the patient as the family member/significant other with whom the patient will be residing, and identified as the person(s) who will aid the patient in the event of a mental health crisis (suicidal ideations/suicide attempt).  With written consent from the patient, the family member/significant other has been provided the following suicide prevention education, prior to the and/or following the discharge of the patient.  The suicide prevention education provided includes the following:  Suicide risk factors  Suicide prevention and interventions  National Suicide Hotline telephone number  [facility] assessment telephone number  [facility] Emergency Assistance [place] and/or Residential Mobile Crisis Unit telephone number  Request made of family/significant other to:  Remove weapons (e.g., guns, rifles, knives), all items previously/currently identified as safety concern.    Remove drugs/medications (over-the-counter, prescriptions, illicit drugs), all items previously/currently identified as a safety concern.  The family member/significant other verbalizes understanding of the suicide prevention education information provided.  The family member/significant other agrees to remove the items of safety concern listed above.  Bo Mcclintock 01/22/2016, 3:24 PM

## 2016-01-22 NOTE — Progress Notes (Signed)
DAR NOTE: Patient presents with anxious affect and depressed mood.  Patient more alert and awake.  Patient up and in the dayroom most of the shift.  Denies  auditory and visual hallucinations.  Rates depression at 5, hopelessness at 7, and anxiety at 5. Describes energy level as low and concentration as good.  Maintained on routine safety checks.  Medications given as prescribed.  Support and encouragement offered as needed.  Attended group and participated.  States goal for today is "going home."  Patient observed socializing with peers in the dayroom.  Patient requested and received Ibuprofen 600 mg for complain of back pain with good effect.

## 2016-01-22 NOTE — Progress Notes (Signed)
D: Patient denies SI/HI or AVH. Pt. Attended evening group and then went straight to her bed.  Pt. States, "I'm depressed but ok", she verbally contracts for safety.  Pt. States she had a good day overall.  She reports that her appetite is good as well as sleep.    A: Patient given emotional support from RN. Patient encouraged to come to staff with concerns and/or questions. Patient's medication routine continued. Patient's orders and plan of care reviewed.   R: Patient remains appropriate and cooperative. Will continue to monitor patient q15 minutes for safety.

## 2016-01-22 NOTE — B[facility] Group Notes (Signed)
[facility] LCSW Aftercare Discharge Planning Group Note   01/22/2016 3:05 PM  Participation Quality: Active   Mood/Affect:  Appropriate  Depression Rating:  6  Anxiety Rating:  7  Thoughts of Suicide:  No Will you contract for safety?   NA  Current AVH:  No  Plan for Discharge/Comments:  Pt states that she is feeling ok this morning. Pt declined rehab. Will follow-up with [facility].  Transportation Means:   Supports:  Georga Kaufmann

## 2016-01-23 NOTE — Progress Notes (Signed)
Patient ID: Becky [name], female   DOB: January 29, 1961, 55 y.o.   MRN: [id number] [facility] MD Progress Note  01/23/2016 2:14 PM EARLEE STROUPE  MRN:  [id number]  Subjective: Pt states " I want to go home"  Objective: Zyriana is seen, chart reviewed. She is visible on the unit. She is active in the group counseling sessions. 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 She says her main problems is not having enough money & her inability to find employment are the main reason sfor her depression & lashing out. She denies any new issues. Koriann is tolerating her medication well. She appears to be in no apparent distress. She currently denies any SIHI, AVH. She planing on quitting alcohol for good after discharge.  Principal Problem: Major depressive disorder, recurrent, severe without psychotic features ([place]) Diagnosis:   Patient Active Problem List   Diagnosis Date Noted  . Alcohol use disorder, severe, dependence ([place]) [F10.20] 01/20/2016  . Cocaine use disorder, severe, dependence ([place]) [F14.20] 01/20/2016  . 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 Chronic pain syndrome [G89.4] 01/20/2016  . Hepatitis C [B19.20] 01/20/2016  . Prolapsed internal hemorrhoids [K64.8] 06/08/2015  . Constipation [K59.00] 06/08/2015  . Major depressive disorder, recurrent, severe without psychotic features ([place]) [F33.2]    Total Time spent with patient: 15 minutes  Past Psychiatric History: Pt was admitted at [facility] in the past - 02/12/15- 02/14/15. Pt has a past hx of MDD and substance abuse. Pt is noncompliant with out patient treatment.  Past Medical History:  Past Medical History  Diagnosis Date  . Hypertension   . Bronchitis   . Headache   . Depression   . Cocaine abuse   . MDD (major depressive disorder) [facility])     Past Surgical History  Procedure Laterality Date  . Cysts removed from armpits bilaterally    . Colonoscopy with propofol N/A  06/23/2015    SLF: 1. Rectal bleeding/pain due to hemorrhoids2. one large and one small colorectal polyp removed.   . Polypectomy N/A 06/23/2015    Procedure: POLYPECTOMY;  Surgeon: Danie Binder, MD;  Location: [facility];  Service: Endoscopy;  Laterality: N/A;  descending colon   Family History:  Family History  Problem Relation Age of Onset  . Heart failure Mother   . Coronary artery disease Mother   . Heart failure Father   . Coronary artery disease Father   . Colon cancer Neg Hx   . Mental illness Neg Hx    Family Psychiatric  History: Pt denies substance abuse or suicide in the family.  Social History: Pt is single, went up to 11 th grade, got her GED, is unemployed , has two children aged 66 and 31. Pt reports their aunt is taking care of them, DSS involved as per initial noted in EHR.  History  Alcohol Use  . 12.0 oz/week  . 20 Cans of beer per week    Comment: 2 40 oz beer daily      History  Drug Use  . Yes  . Special: Cocaine, Benzodiazepines    Comment: cocaine last used 01/17/16    Social History   Social History  . Marital Status: Single    Spouse Name: N/A  . Number of Children: N/A  . Years of Education: N/A   Social History Main Topics  . Smoking status: Current Some Day Smoker -- 0.00 packs/day  Types: Cigarettes  . Smokeless tobacco: Never Used     Comment: vague reports of smoking intermittently   . Alcohol Use: 12.0 oz/week    20 Cans of beer per week     Comment: 2 40 oz beer daily   . Drug Use: Yes    Special: Cocaine, Benzodiazepines     Comment: cocaine last used 01/17/16  . Sexual Activity: Yes   Other Topics Concern  . None   Social History Narrative   Additional Social History:   Sleep: Good  Appetite:  Good  Current Medications: Current Facility-Administered Medications  Medication Dose Route Frequency Provider Last Rate Last Dose  . alum & mag hydroxide-simeth (MAALOX/MYLANTA) 200-200-20 MG/5ML suspension 30 mL  30 mL Oral Q4H  PRN Derrill Center, NP      . chlordiazePOXIDE (LIBRIUM) capsule 25 mg  25 mg Oral Daily Ursula Alert, MD   25 mg at 01/23/16 0744  . DULoxetine (CYMBALTA) DR capsule 30 mg  30 mg Oral BID Ursula Alert, MD   30 mg at 01/23/16 0738  . fluticasone (FLONASE) 50 MCG/ACT nasal spray 2 spray  2 spray Each Nare Daily Laverle Hobby, PA-C   2 spray at 01/22/16 [id number]  . guaiFENesin (MUCINEX) 12 hr tablet 600 mg  600 mg Oral BID PRN Ursula Alert, MD   600 mg at 01/22/16 1012  . ibuprofen (ADVIL,MOTRIN) tablet 600 mg  600 mg Oral Q6H PRN Ursula Alert, MD   600 mg at 01/23/16 0738  . lidocaine (LIDODERM) 5 % 1 patch  1 patch Transdermal Daily Ursula Alert, MD   1 patch at 01/23/16 0739  . lisinopril (PRINIVIL,ZESTRIL) tablet 10 mg  10 mg Oral Daily Ursula Alert, MD   10 mg at 01/23/16 0738  . magnesium hydroxide (MILK OF MAGNESIA) suspension 30 mL  30 mL Oral Daily PRN Derrill Center, NP   30 mL at 01/22/16 1523  . multivitamin with minerals tablet 1 tablet  1 tablet Oral Daily Ursula Alert, MD   1 tablet at 01/23/16 0738  . thiamine (B-1) injection 100 mg  100 mg Intramuscular Once Ursula Alert, MD   100 mg at 01/20/16 1210  . thiamine (VITAMIN B-1) tablet 100 mg  100 mg Oral Daily Ursula Alert, MD   100 mg at 01/23/16 0744  . traZODone (DESYREL) tablet 50 mg  50 mg Oral QHS,MR X 1 Derrill Center, NP   50 mg at 01/20/16 [id number]   Lab Results:  No results found for this or any previous visit (from the past 48 hour(s)).  Blood Alcohol level:  Lab Results  Component Value Date   ETH 5* 01/18/2016   ETH <5 02/12/2015   Physical Findings: AIMS: Facial and Oral Movements Muscles of Facial Expression: None, normal Lips and Perioral Area: None, normal Jaw: None, normal Tongue: None, normal,Extremity Movements Upper (arms, wrists, hands, fingers): None, normal Lower (legs, knees, ankles, toes): None, normal, Trunk Movements Neck, shoulders, hips: None, normal, Overall Severity Severity of  abnormal movements (highest score from questions above): None, normal Incapacitation due to abnormal movements: None, normal Patient's awareness of abnormal movements (rate only patient's report): No Awareness, Dental Status Current problems with teeth and/or dentures?: No Does patient usually wear dentures?: No  CIWA:  CIWA-Ar Total: 0 COWS:     Musculoskeletal: Strength & Muscle Tone: within normal limits Gait & Station: normal Patient leans: N/A  Psychiatric Specialty Exam: Review of Systems  Respiratory: Positive for cough.  Psychiatric/Behavioral: Positive for depression and substance abuse. The patient is nervous/anxious.   All other systems reviewed and are negative.   Blood pressure 113/90, pulse 52, temperature 98 F (36.7 C), temperature source Oral, resp. rate 18, height 5\' 9"  (1.753 m), weight 73.936 kg (163 lb), SpO2 98 %.Body mass index is 24.06 kg/(m^2).  General Appearance: Fairly Groomed  Engineer, water::  Minimal  Speech:  Slow  Volume:  Normal  Mood:  "I'm feeling a lot better"  Affect:  Depressed  Thought Process:  Coherent  Orientation:  Full (Time, Place, and Person)  Thought Content:  Rumination  Suicidal Thoughts:  No  Homicidal Thoughts:  No  Memory:  Immediate;   Fair Recent;   Fair Remote;   Fair  Judgement:  Impaired  Insight:  Shallow  Psychomotor Activity:  Normal  Concentration:  Good  Recall:  Poor  Fund of Knowledge:Fair  Language: Fair  Akathisia:  No  Handed:  Right  AIMS (if indicated):     Assets:  Desire for Improvement  ADL's:  Intact  Cognition: WNL  Sleep:  Number of Hours: 6.75   Treatment Plan Summary:Stephaniemarie C Munyan is a 3 y.o. AA female, who is single, unemployed , who lives in [place] with her two children, has a hx of depression as well as polysubstance abuse , who presented to [facility] by [employer] for SI/HI. Pt continues to be depressed, anxious , although making progress . Will continue  treatment.  Daily contact with patient to assess and evaluate symptoms and progress in treatment and Medication management: Will continue  Cymbalta 30 mg po BID for affective sx. Will continue Trazodone 50 mg po qhs for sleep. Will make available PRN medications as per agitation protocol. Will continue CIWA /librium protocol. Patient continues to be motivated to get help for her substance abuse problems- CSW has been working with her for the same. Will continue to monitor vitals ,medication compliance and treatment side effects while patient is here.  Will monitor for medical issues as well as call consult as needed.  Reviewed labs UDS positive for BZD, cocaine, BAL 5, cbc , cmp - wnl , EKG since she reports OD in on trazodone- qtc wnl  ,  TSH- wnl, LFT - wnl  Hep C virus ab - 2.6 , reviewed hep c rna quantitative, not detected, HCV AB high at 2.6. CSW will continue working on disposition.Likely discharge in 2-3 days based on progress. Patient to participate in therapeutic milieu .   Lindell Spar I, NP, PMHNP-BC 01/23/2016, 2:14 PM Agree with NP Progress Note, as above  Neita Garnet, MD

## 2016-01-23 NOTE — Progress Notes (Signed)
D: Pt denies SI/HI/AVH. Pt is pleasant and cooperative. Pt stated she is doing much better because of the groups, the medication, and she has actually calmed down by being in here. Pt plans to continue out pt Tx possibly at [facility] on D/c , but pt has better outlook on her future than she had on admission per pt.   A: Pt was offered support and encouragement. Pt was given scheduled medications. Pt was encourage to attend groups. Q 15 minute checks were done for safety.   R:Pt attends groups and interacts well with peers and staff. Pt is taking medication. Pt has no complaints.Pt receptive to treatment and safety maintained on unit.

## 2016-01-23 NOTE — Progress Notes (Signed)
DAR NOTE: Patient presents with anxious affect and depressed mood.  Denies auditory and visual hallucinations.  Rates depression at 7, hopelessness at 0, and anxiety at 2.  Maintained on routine safety checks.  Medications given as prescribed.  Support and encouragement offered as needed.  Attended group and participated.  States goal for today is "going home."  Patient observed socializing with peers in the dayroom.  Requested and received Motrin 600 mg for complain of lower back pain with good effect.  Patient concern about discharge date due to pending appointment.  Patient verbalizes discharge readiness and plan.

## 2016-01-23 NOTE — B[facility] Group Notes (Signed)
[place] Group Notes: (Clinical Social Work)  01/23/2016 11:15-12:00PM  Summary of Progress/Problems: Today's process group involved patients discussing their feelings related to being hospitalized, as well as how they can avoid future hospitalizations. An exploration of key elements of wellness included medications, adherence, use of doctors, use of therapy, sleep hygiene, and a number of healthy coping skills that various patients have tried successfully. The patient expressed her primary feeling about being hospitalized is good, and she described the helpfulness of using a pill box.  She was positive and goal-oriented during the group.  Type of Therapy: Group Therapy - Process  Participation Level: Active  Participation Quality: Attentive  Affect: Appropriate  Cognitive: Appropriate  Insight: Improving  Engagement in Therapy: Improving  Modes of Intervention: Exploration, Discussion  Selmer Dominion, LCSW 01/23/2016, 12:14 PM

## 2016-01-23 NOTE — BHH Group Notes (Signed)
Adult Psychoeducational Group Note  Date:  01/23/2016 Time:  12:07 AM  Group Topic/Focus:  Wrap-Up Group:   The focus of this group is to help patients review their daily goal of treatment and discuss progress on daily workbooks.  Participation Level:  Active  Participation Quality:  Appropriate  Affect:  Appropriate  Cognitive:  Appropriate  Insight: Good  Engagement in Group:  Engaged  Modes of Intervention:  Discussion  Additional Comments:  Pt rated her day a 48.  She stated she had a visit with her family.  Her goal was to go home.  She also expressed her day was wonderful.  Becky [name] A 01/23/2016, 12:07 AM

## 2016-01-23 NOTE — Plan of Care (Signed)
Problem: Alteration in mood & ability to function due to Goal: LTG-Pt reports reduction in suicidal thoughts (Patient reports reduction in suicidal thoughts and is able to verbalize a safety plan for whenever patient is feeling suicidal)  Outcome: Progressing Pt denies SI at this time     

## 2016-01-23 NOTE — BHH Group Notes (Signed)
[facility] Group Notes:  (Nursing/MHT/Case Management/Adjunct)  Date:  01/23/2016  Time:  10:59 AM  Type of Therapy:  Nurse Education  Participation Level:  Active  Participation Quality:  Appropriate and Attentive  Affect:  Appropriate  Cognitive:  Alert and Appropriate  Insight:  Appropriate and Good  Engagement in Group:  Engaged  Modes of Intervention:  Discussion and Education  Summary of Progress/Problems: Topic was on healthy coping skills. Discussed the importance of learning new coping skills that leads to a healthy lifestyle. Patient was attentive and receptive.  Mart Piggs 01/23/2016, 10:59 AM

## 2016-01-23 NOTE — Progress Notes (Signed)
Adult Psychoeducational Group Note  Date:  01/23/2016 Time:  9:27 PM  Group Topic/Focus:  Wrap-Up Group:   The focus of this group is to help patients review their daily goal of treatment and discuss progress on daily workbooks.  Participation Level:  Active  Participation Quality:  Appropriate  Affect:  Appropriate  Cognitive:  Appropriate  Insight: Appropriate  Engagement in Group:  Engaged  Modes of Intervention:  Discussion  Additional Comments:  The patient expressed that she attended groups and had a good day. Nash Shearer 01/23/2016, 9:27 PM

## 2016-01-24 NOTE — Progress Notes (Signed)
Patient ID: Becky [name], female   DOB: January 23, 1961, 55 y.o.   MRN: [id number]   D: Pt has been appropriate on the unit today. Pt attended all groups and engaged in treatment. Pt reported that she felt better, and that she was ready for discharge. Pt reported that her depression was a 3, her hopelessness was a 2, and her anxiety was a 0. Pt reported that her goal for today was to go home. Pt reported being negative SI/HI, no AH/VH noted. A: 15 min checks continued for patient safety. R: Pt safety maintained.

## 2016-01-24 NOTE — BHH Group Notes (Signed)
[facility] Group Notes:  (Clinical Social Work)  01/24/2016  11:00AM-12:00PM  Summary of Progress/Problems:  The main focus of today's process group was to listen to a variety of genres of music and to identify that different types of music provoke different responses.  The patient then was able to identify personally what was soothing for them, as well as energizing.  Today in group, there were many patients singing, a few dancing, and many visibly enjoyed themselves, were encouraging to others to participate. The patient expressed understanding of concepts, as well as knowledge of how each type of music affected her and how this can be used at home as a wellness/recovery tool.  At the beginning of group she stated she was extremely sleep, at a 9 out of 10, and was noticeably drowsy.  Throughout group she led a lot of the singing, got up and danced quite a bit, encouraged others to sing and dance.  She stated at the end of group that she was wide awake and feeling much better.  Type of Therapy:  Music Therapy   Participation Level:  Active  Participation Quality:  Attentive and Sharing  Affect:  Blunted  Cognitive:  Oriented  Insight:  Engaged  Engagement in Therapy:  Engaged  Modes of Intervention:   Activity, Exploration  Selmer Dominion, LCSW 01/24/2016 2:43 PM

## 2016-01-24 NOTE — Progress Notes (Signed)
Patient ID: Becky [name], female   DOB: Nov 26, 1960, 55 y.o.   MRN: [id number] Patient ID: Becky [name], female   DOB: 1961-05-18, 55 y.o.   MRN: [id number] [facility] MD Progress Note  01/24/2016 1:49 PM Becky [name]  MRN:  [id number]  Subjective: Pt states "I feel good today, I really need to go home"  Objective: Becky [name] is seen, chart reviewed. She is visible on the unit. She is active in the group counseling sessions. 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 She says she feels she is ready to go home, go out there & find some work. She currently denies any new symptoms. She denies any SIHI, AVH, delusional thoughts or paranoia. She appears to be in no apparent distress. She is tolerating her medication regimen without any side effects. She is no longer having substance withdrawal symptoms.  Principal Problem: Major depressive disorder, recurrent, severe without psychotic features ([place]) Diagnosis:   Patient Active Problem List   Diagnosis Date Noted  . Alcohol use disorder, severe, dependence ([place]) [F10.20] 01/20/2016  . Cocaine use disorder, severe, dependence ([place]) [F14.20] 01/20/2016  . 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 Chronic pain syndrome [G89.4] 01/20/2016  . Hepatitis C [B19.20] 01/20/2016  . Prolapsed internal hemorrhoids [K64.8] 06/08/2015  . Constipation [K59.00] 06/08/2015  . Major depressive disorder, recurrent, severe without psychotic features ([place]) [F33.2]    Total Time spent with patient: 15 minutes  Past Psychiatric History: Pt was admitted at [facility] in the past - 02/12/15- 02/14/15. Pt has a past hx of MDD and substance abuse. Pt is noncompliant with out patient treatment.  Past Medical History:  Past Medical History  Diagnosis Date  . Hypertension   . Bronchitis   . Headache   . Depression   . Cocaine abuse   . MDD (major depressive disorder) [facility])     Past Surgical History   Procedure Laterality Date  . Cysts removed from armpits bilaterally    . Colonoscopy with propofol N/A 06/23/2015    SLF: 1. Rectal bleeding/pain due to hemorrhoids2. one large and one small colorectal polyp removed.   . Polypectomy N/A 06/23/2015    Procedure: POLYPECTOMY;  Surgeon: Danie Binder, MD;  Location: [facility];  Service: Endoscopy;  Laterality: N/A;  descending colon   Family History:  Family History  Problem Relation Age of Onset  . Heart failure Mother   . Coronary artery disease Mother   . Heart failure Father   . Coronary artery disease Father   . Colon cancer Neg Hx   . Mental illness Neg Hx    Family Psychiatric  History: Pt denies substance abuse or suicide in the family.  Social History: Pt is single, went up to 11 th grade, got her GED, is unemployed , has two children aged 79 and 56. Pt reports their aunt is taking care of them, DSS involved as per initial noted in EHR.  History  Alcohol Use  . 12.0 oz/week  . 20 Cans of beer per week    Comment: 2 40 oz beer daily      History  Drug Use  . Yes  . Special: Cocaine, Benzodiazepines    Comment: cocaine last used 01/17/16    Social History   Social History  . Marital Status: Single    Spouse Name: N/A  . Number of Children: N/A  . Years of Education: N/A  Social History Main Topics  . Smoking status: Current Some Day Smoker -- 0.00 packs/day    Types: Cigarettes  . Smokeless tobacco: Never Used     Comment: vague reports of smoking intermittently   . Alcohol Use: 12.0 oz/week    20 Cans of beer per week     Comment: 2 40 oz beer daily   . Drug Use: Yes    Special: Cocaine, Benzodiazepines     Comment: cocaine last used 01/17/16  . Sexual Activity: Yes   Other Topics Concern  . None   Social History Narrative   Additional Social History:   Sleep: Good  Appetite:  Good  Current Medications: Current Facility-Administered Medications  Medication Dose Route Frequency Provider Last Rate Last  Dose  . alum & mag hydroxide-simeth (MAALOX/MYLANTA) 200-200-20 MG/5ML suspension 30 mL  30 mL Oral Q4H PRN Derrill Center, NP      . DULoxetine (CYMBALTA) DR capsule 30 mg  30 mg Oral BID Ursula Alert, MD   30 mg at 01/24/16 0754  . fluticasone (FLONASE) 50 MCG/ACT nasal spray 2 spray  2 spray Each Nare Daily Laverle Hobby, PA-C   2 spray at 01/24/16 0754  . guaiFENesin (MUCINEX) 12 hr tablet 600 mg  600 mg Oral BID PRN Ursula Alert, MD   600 mg at 01/22/16 1012  . ibuprofen (ADVIL,MOTRIN) tablet 600 mg  600 mg Oral Q6H PRN Ursula Alert, MD   600 mg at 01/23/16 0738  . lidocaine (LIDODERM) 5 % 1 patch  1 patch Transdermal Daily Ursula Alert, MD   1 patch at 01/24/16 0754  . lisinopril (PRINIVIL,ZESTRIL) tablet 10 mg  10 mg Oral Daily Ursula Alert, MD   10 mg at 01/23/16 0738  . magnesium hydroxide (MILK OF MAGNESIA) suspension 30 mL  30 mL Oral Daily PRN Derrill Center, NP   30 mL at 01/22/16 1523  . multivitamin with minerals tablet 1 tablet  1 tablet Oral Daily Ursula Alert, MD   1 tablet at 01/24/16 0754  . thiamine (B-1) injection 100 mg  100 mg Intramuscular Once Ursula Alert, MD   100 mg at 01/20/16 1210  . thiamine (VITAMIN B-1) tablet 100 mg  100 mg Oral Daily Ursula Alert, MD   100 mg at 01/24/16 0754  . traZODone (DESYREL) tablet 50 mg  50 mg Oral QHS,MR X 1 Derrill Center, NP   50 mg at 01/20/16 [id number]   Lab Results:  No results found for this or any previous visit (from the past 48 hour(s)).  Blood Alcohol level:  Lab Results  Component Value Date   ETH 5* 01/18/2016   ETH <5 02/12/2015   Physical Findings: AIMS: Facial and Oral Movements Muscles of Facial Expression: None, normal Lips and Perioral Area: None, normal Jaw: None, normal Tongue: None, normal,Extremity Movements Upper (arms, wrists, hands, fingers): None, normal Lower (legs, knees, ankles, toes): None, normal, Trunk Movements Neck, shoulders, hips: None, normal, Overall Severity Severity of  abnormal movements (highest score from questions above): None, normal Incapacitation due to abnormal movements: None, normal Patient's awareness of abnormal movements (rate only patient's report): No Awareness, Dental Status Current problems with teeth and/or dentures?: No Does patient usually wear dentures?: No  CIWA:  CIWA-Ar Total: 0 COWS:     Musculoskeletal: Strength & Muscle Tone: within normal limits Gait & Station: normal Patient leans: N/A  Psychiatric Specialty Exam: Review of Systems  Respiratory: Positive for cough.   Psychiatric/Behavioral: Positive for depression  and substance abuse. The patient is nervous/anxious.   All other systems reviewed and are negative.   Blood pressure 113/95, pulse 59, temperature 98.1 F (36.7 C), temperature source Oral, resp. rate 18, height 5\' 9"  (1.753 m), weight 73.936 kg (163 lb), SpO2 98 %.Body mass index is 24.06 kg/(m^2).  General Appearance: Fairly Groomed  Engineer, water::  Minimal  Speech:  Slow  Volume:  Normal  Mood:  "I'm feeling a lot better"  Affect:  Depressed  Thought Process:  Coherent  Orientation:  Full (Time, Place, and Person)  Thought Content:  Rumination  Suicidal Thoughts:  No  Homicidal Thoughts:  No  Memory:  Immediate;   Fair Recent;   Fair Remote;   Fair  Judgement:  Impaired  Insight:  Shallow  Psychomotor Activity:  Normal  Concentration:  Good  Recall:  Poor  Fund of Knowledge:Fair  Language: Fair  Akathisia:  No  Handed:  Right  AIMS (if indicated):     Assets:  Desire for Improvement  ADL's:  Intact  Cognition: WNL  Sleep:  Number of Hours: 6.75   Treatment Plan Summary: YASMEEN EGERER is a 55 y.o. AA female, who is single, unemployed , who lives in [place] with her two children, has a hx of depression as well as polysubstance abuse , who presented to [place] by [employer] for SI/HI. Pt continues to be depressed, anxious , although making progress . Will continue  treatment.  Daily contact with patient to assess and evaluate symptoms and progress in treatment and Medication management: Will continue Cymbalta 30 mg po BID for affective sx. Will continue Trazodone 50 mg po qhs for sleep. Will make available PRN medications as per agitation protocol. Will discontinue continue CIWA /librium protocol Pt no longer experiencing withdrawal symtoms. Patient continues to be motivated to get help for her substance abuse problems- CSW has been working with her for the same. Will continue to monitor vitals ,medication compliance and treatment side effects while patient is here.  Will monitor for medical issues as well as call consult as needed.  Reviewed labs UDS positive for BZD, cocaine, BAL 5, cbc , cmp - wnl , EKG since she reports OD in on trazodone- qtc wnl  ,  TSH- wnl, LFT - wnl  Hep C virus ab - 2.6 , reviewed hep c rna quantitative, not detected, HCV AB high at 2.6, no changes. CSW will continue working on disposition.Likely discharge in 2-3 days based on progress. Patient to participate in therapeutic milieu .   Lindell Spar I, NP, PMHNP-BC 01/24/2016, 1:49 PM Agree with NP Progress Note, as above  Neita Garnet, MD

## 2016-01-24 NOTE — Progress Notes (Signed)
Adult Psychoeducational Group Note  Date:  01/24/2016 Time:  8:43 PM  Group Topic/Focus:  Wrap-Up Group:   The focus of this group is to help patients review their daily goal of treatment and discuss progress on daily workbooks.  Participation Level:  Active  Participation Quality:  Appropriate  Affect:  Appropriate  Cognitive:  Appropriate  Insight: Appropriate  Engagement in Group:  Engaged  Modes of Intervention:  Discussion  Additional Comments: The patient expressed that she attend groups today.The patient also said that music therapy was uplifting.  Nash Shearer 01/24/2016, 8:43 PM

## 2016-01-25 DIAGNOSIS — B182 Chronic viral hepatitis C: Secondary | ICD-10-CM | POA: Insufficient documentation

## 2016-01-25 MED ORDER — LISINOPRIL 10 MG PO TABS: 10.0000 mg | ORAL_TABLET | Freq: Every day | ORAL | Status: DC

## 2016-01-25 MED ORDER — TRAZODONE HCL 50 MG PO TABS: 50.0000 mg | ORAL_TABLET | Freq: Every evening | ORAL | Status: DC | PRN

## 2016-01-25 MED ORDER — DULOXETINE HCL 30 MG PO CPEP: 30.0000 mg | ORAL_CAPSULE | Freq: Two times a day (BID) | ORAL | Status: DC

## 2016-01-25 NOTE — Progress Notes (Signed)
Patient discharged home with prescriptions. Patient was stable and appreciative at that time. All papers and prescriptions were given and valuables returned. Verbal understanding expressed. Denies SI/HI and A/VH. Patient given opportunity to express concerns and ask questions.

## 2016-01-25 NOTE — Progress Notes (Signed)
  [facility] Adult Case Management Discharge Plan :  Will you be returning to the same living situation after discharge:  Yes,  home At discharge, do you have transportation home?: Yes,  family Do you have the ability to pay for your medications: Yes,  MCD  Release of information consent forms completed and in the chart;  Patient's signature needed at discharge.  Patient to Follow up at: Follow-up Information    Follow up with [facility] On 01/28/2016.   Why:  Thursday at 2:00pm for your hospital discharge appointment. Please arrive at 1:30pm to complete paperwork and bring your ID and insurance card.   Contact information:   [address], [place], [place]  [address] Ph:  [phone] (fax) [phone]      Next level of care provider has access to [place] and Suicide Prevention discussed: Yes,  yes  Have you used any form of tobacco in the last 30 days? (Cigarettes, Smokeless Tobacco, Cigars, and/or Pipes): Yes  Has patient been referred to the Quitline?: Patient refused referral  Patient has been referred for addiction treatment: Yes  Roque Lias B 01/25/2016, 3:22 PM

## 2016-01-25 NOTE — Tx Team (Signed)
Interdisciplinary Treatment Plan Update (Adult) Date: 01/25/2016   Date: 01/25/2016 8:11 AM  Progress in Treatment:  Attending groups: Yes  Participating in groups: Yes  Taking medication as prescribed: Yes  Tolerating medication: Yes  Family/Significant othe contact made: No, CSW assessing for appropriate contact Patient understands diagnosis: Yes AEB seeking help with depression and substance abuse Discussing patient identified problems/goals with staff: Yes  Medical problems stabilized or resolved: Yes  Denies suicidal/homicidal ideation: No, Pt recently admitted with SI Patient has not harmed self or Others: Yes   New problem(s) identified: None identified at this time.   Discharge Plan or Barriers: Pt considering rehab referral. Otherwise, will return home and follow-up with outpatient resources  Additional comments:  Patient and CSW reviewed pt's identified goals and treatment plan. Patient verbalized understanding and agreed to treatment plan. CSW reviewed [facility] "Discharge Process and Patient Involvement" Form. Pt verbalized understanding of information provided and signed form.   Reason for Continuation of Hospitalization:    Estimated length of stay: D/C today  Review of initial/current patient goals per problem list:   1.  Goal(s): Patient will participate in aftercare plan  Met:  Yes  Target date: 3-5 days from date of admission   As evidenced by: Patient will participate within aftercare plan AEB aftercare provider and housing plan at discharge being identified.  01/20/16: Pt considering rehab referral. Otherwise, will return home and follow-up with outpatient resources   01/25/16:  Return home, follow up [facility]  2.  Goal (s): Patient will exhibit decreased depressive symptoms and suicidal ideations.  Met:  Yes  Target date: 3-5 days from date of admission   As evidenced by: Patient will utilize self rating of depression at 3 or below and demonstrate  decreased signs of depression or be deemed stable for discharge by MD. 01/20/16: Pt was admitted with symptoms of depression, rating 10/10. Pt continues to present with flat affect and depressive symptoms.  Pt will demonstrate decreased symptoms of depression and rate depression at 3/10 or lower prior to discharge. 3/6/17[name] denies depression today  4.  Goal(s): Patient will demonstrate decreased signs of withdrawal due to substance abuse  Met:  Yes  Target date: 3-5 days from date of admission   As evidenced by: Patient will produce a CIWA/COWS score of 0, have stable vitals signs, and no symptoms of withdrawal\   01/20/16: Pt has CIWA score of 1; only anxiety listed as symptom of withdrawal.  Attendees:  Patient:    Family:    Physician: Dr. Shea Evans, MD  01/25/2016 8:11 AM  Nursing: Lars Pinks, RN Case manager  01/25/2016 8:11 AM  Clinical Social Worker [place]  01/25/2016 8:11 AM  Other:  01/25/2016 8:11 AM  Clinical:  01/25/2016 8:11 AM  Other: , RN Charge Nurse 01/25/2016 8:11 AM  Other: Hilda Lias, P4CC

## 2016-01-25 NOTE — BHH Suicide Risk Assessment (Signed)
[facility] Discharge Suicide Risk Assessment   Principal Problem: Major depressive disorder, recurrent, severe without psychotic features [facility]) Discharge Diagnoses:  Patient Active Problem List   Diagnosis Date Noted  . Alcohol use disorder, severe, dependence ([place]) [F10.20] 01/20/2016  . Cocaine use disorder, severe, dependence ([place]) [F14.20] 01/20/2016  . 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 Chronic pain syndrome [G89.4] 01/20/2016  . Hepatitis C [B19.20] 01/20/2016  . Prolapsed internal hemorrhoids [K64.8] 06/08/2015  . Constipation [K59.00] 06/08/2015  . Major depressive disorder, recurrent, severe without psychotic features ([place]) [F33.2]     Total Time spent with patient: 30 minutes  Musculoskeletal: Strength & Muscle Tone: within normal limits Gait & Station: normal Patient leans: N/A  Psychiatric Specialty Exam: Review of Systems  Psychiatric/Behavioral: Positive for substance abuse. Negative for depression, suicidal ideas and hallucinations. The patient is not nervous/anxious.   All other systems reviewed and are negative.   Blood pressure 105/78, pulse 72, temperature 97.8 F (36.6 C), temperature source Oral, resp. rate 62, height 5\' 9"  (1.753 m), weight 73.936 kg (163 lb), SpO2 98 %.Body mass index is 24.06 kg/(m^2).  General Appearance: Casual  Eye Contact::  Fair  Speech:  Clear and [id number]  Volume:  Normal  Mood:  Euthymic  Affect:  Appropriate  Thought Process:  Coherent  Orientation:  Full (Time, Place, and Person)  Thought Content:  WDL  Suicidal Thoughts:  No  Homicidal Thoughts:  No  Memory:  Immediate;   Fair Recent;   Fair Remote;   Fair  Judgement:  Fair  Insight:  Fair  Psychomotor Activity:  Normal  Concentration:  Fair  Recall:  [employer] of Knowledge:Fair  Language: Fair  Akathisia:  No  Handed:  Right  AIMS (if indicated):     Assets:  Desire for Improvement  Sleep:  Number of Hours: 7  Cognition: WNL  ADL's:   Intact   Mental Status Per Nursing Assessment::   On Admission:  NA  Demographic Factors:  Unemployed  Loss Factors: Decrease in vocational status and Legal issues  Historical Factors: Impulsivity  Risk Reduction Factors:   Responsible for children under 36 years of age, Sense of responsibility to family, Religious beliefs about death and Positive social support  Continued Clinical Symptoms:  Alcohol/Substance Abuse/Dependencies Previous Psychiatric Diagnoses and Treatments  Cognitive Features That Contribute To Risk:  None    Suicide Risk:  Minimal: No identifiable suicidal ideation.  Patients presenting with no risk factors but with morbid ruminations; may be classified as minimal risk based on the severity of the depressive symptoms  Follow-up Information    Follow up with [facility] On 01/28/2016.   Why:  at 2:00pm for your hospital discharge appointment. Please arrive at 1:30pm to complete paperwork and bring your ID and insurance card.   Contact information:   [address] [place], [place]  [address] Ph:  [phone] (fax) [phone]      Plan Of Care/Follow-up recommendations:  Activity:  no restrictions Diet:  regular Tests:  as needed Other:  follow up with after care  [place], MD 01/25/2016, 10:01 AM

## 2016-01-25 NOTE — Discharge Summary (Signed)
Physician Discharge Summary Note  Patient:  Becky [name] is an 55 y.o., female MRN:  [id number] DOB:  [birth year] Patient phone:  [phone] (home)  Patient address:   [address] [place] [address],  Total Time spent with patient: 30 minutes  Date of Admission:  01/19/2016 Date of Discharge: 01/25/2016  Reason for Admission:     Principal Problem: Major depressive disorder, recurrent, severe without psychotic features [facility]) Discharge Diagnoses: Patient Active Problem List   Diagnosis Date Noted  . Chronic hepatitis C without hepatic coma ([place]) [B18.2]   . Alcohol use disorder, severe, dependence ([place]) [F10.20] 01/20/2016  . Cocaine use disorder, severe, dependence ([place]) [F14.20] 01/20/2016  . 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 Chronic pain syndrome [G89.4] 01/20/2016  . Hepatitis C [B19.20] 01/20/2016  . Prolapsed internal hemorrhoids [K64.8] 06/08/2015  . Constipation [K59.00] 06/08/2015  . Major depressive disorder, recurrent, severe without psychotic features ([place]) [F33.2]     Past Psychiatric History:  See above noted  Past Medical History:  Past Medical History  Diagnosis Date  . Hypertension   . Bronchitis   . Headache   . Depression   . Cocaine abuse   . MDD (major depressive disorder) [facility])     Past Surgical History  Procedure Laterality Date  . Cysts removed from armpits bilaterally    . Colonoscopy with propofol N/A 06/23/2015    SLF: 1. Rectal bleeding/pain due to hemorrhoids2. one large and one small colorectal polyp removed.   . Polypectomy N/A 06/23/2015    Procedure: POLYPECTOMY;  Surgeon: Danie Binder, MD;  Location: [facility];  Service: Endoscopy;  Laterality: N/A;  descending colon   Family History:  Family History  Problem Relation Age of Onset  . Heart failure Mother   . Coronary artery disease Mother   . Heart failure Father   . Coronary artery disease Father   . Colon cancer Neg Hx   . Mental illness Neg Hx     Family Psychiatric  History:  See above noted Social History:  History  Alcohol Use  . 12.0 oz/week  . 20 Cans of beer per week    Comment: 2 40 oz beer daily      History  Drug Use  . Yes  . Special: Cocaine, Benzodiazepines    Comment: cocaine last used 01/17/16    Social History   Social History  . Marital Status: Single    Spouse Name: N/A  . Number of Children: N/A  . Years of Education: N/A   Social History Main Topics  . Smoking status: Current Some Day Smoker -- 0.00 packs/day    Types: Cigarettes  . Smokeless tobacco: Never Used     Comment: vague reports of smoking intermittently   . Alcohol Use: 12.0 oz/week    20 Cans of beer per week     Comment: 2 40 oz beer daily   . Drug Use: Yes    Special: Cocaine, Benzodiazepines     Comment: cocaine last used 01/17/16  . Sexual Activity: Yes   Other Topics Concern  . None   Social History Narrative    Hospital Course:   Becky [name] is a 55 y.o.female has a hx of depression as well as polysubstance abuse , who [facility] to [facility] by [facility] department for SI/HI.  Becky [name] was admitted for Major depressive disorder, recurrent, severe without psychotic features ([place]) and crisis management.  She was treated with the following  medications, Trazodone 50 mg po qhs for sleep, PRN medications as per agitation protocol. And patient CIWA /librium protocol without event.   Labs were reviewed upon admission, UDS positive for BZD, cocaine, BAL 5, cbc , cmp - wnl , EKG since she reports OD in on trazodone- qtc wnl , TSH- wnl, LFT - wnl Hep C virus ab - 2.6 , reviewed hep c rna quantitative, not detected, HCV AB high at 2.6, no changes.   Becky [name] was discharged with current medication and was instructed on how to take medications as prescribed; (details listed below under Medication List).  Medical problems were identified and treated as needed.  Home medications were restarted as appropriate.  Patient  continues to be motivated to get help for her substance abuse problems- CSW has been working with her for the same.  Improvement was monitored by observation and Becky [name] daily report of symptom reduction.  Emotional and mental status was monitored by daily self-inventory reports completed by Becky [name] and clinical staff.         Becky [name] was evaluated by the treatment team for stability and plans for continued recovery upon discharge.  Becky [name] motivation was an integral factor for scheduling further treatment.  Employment, transportation, bed availability, health status, family support, and any pending legal issues were also considered during her hospital stay.  She was offered further treatment options upon discharge including but not limited to Residential, Intensive Outpatient, and Outpatient treatment.  Becky [name] will follow up with the services as listed below under Follow Up Information.     Upon completion of this admission the Becky [name] was both mentally and medically stable for discharge denying suicidal/homicidal ideation, auditory/visual/tactile hallucinations, delusional thoughts and paranoia.     Physical Findings: AIMS: Facial and Oral Movements Muscles of Facial Expression: None, normal Lips and Perioral Area: None, normal Jaw: None, normal Tongue: None, normal,Extremity Movements Upper (arms, wrists, hands, fingers): None, normal Lower (legs, knees, ankles, toes): None, normal, Trunk Movements Neck, shoulders, hips: None, normal, Overall Severity Severity of abnormal movements (highest score from questions above): None, normal Incapacitation due to abnormal movements: None, normal Patient's awareness of abnormal movements (rate only patient's report): No Awareness, Dental Status Current problems with teeth and/or dentures?: No Does patient usually wear dentures?: No  CIWA:  CIWA-Ar Total: 0 COWS:     Musculoskeletal: Strength & Muscle Tone:  within normal limits Gait & Station: normal Patient leans: N/A  Psychiatric Specialty Exam:  SEE MD SRA Review of Systems  Psychiatric/Behavioral: Positive for hallucinations. Negative for depression and suicidal ideas.  All other systems reviewed and are negative.   Blood pressure 105/78, pulse 72, temperature 97.8 F (36.6 C), temperature source Oral, resp. rate 62, height 5\' 9"  (1.753 m), weight 73.936 kg (163 lb), SpO2 98 %.Body mass index is 24.06 kg/(m^2).  Have you used any form of tobacco in the last 30 days? (Cigarettes, Smokeless Tobacco, Cigars, and/or Pipes): Yes  Has this patient used any form of tobacco in the last 30 days? (Cigarettes, Smokeless Tobacco, Cigars, and/or Pipes) Yes, N/A  Blood Alcohol level:  Lab Results  Component Value Date   ETH 5* 01/18/2016   ETH <5 [id number]    Metabolic Disorder Labs:  No results [name] for: HGBA1C, MPG No results [name] for: PROLACTIN No results [name] for: CHOL, TRIG, HDL, CHOLHDL, VLDL, LDLCALC  See Psychiatric Specialty Exam and Suicide Risk Assessment completed by Attending Physician prior  to discharge.  Discharge destination:  Home  Is patient on multiple antipsychotic therapies at discharge:  No   Has Patient had three or more failed trials of antipsychotic monotherapy by history:  No  Recommended Plan for Multiple Antipsychotic Therapies: NA     Medication List    STOP taking these medications        albuterol 108 (90 Base) MCG/ACT inhaler  Commonly known as:  PROVENTIL HFA;VENTOLIN HFA     azithromycin 250 MG tablet  Commonly known as:  ZITHROMAX     HYDROcodone-acetaminophen 5-325 MG tablet  Commonly known as:  NORCO/VICODIN     Linaclotide 290 MCG Caps capsule  Commonly known as:  LINZESS     naproxen 375 MG tablet  Commonly known as:  NAPROSYN     predniSONE 10 MG tablet  Commonly known as:  DELTASONE      TAKE these medications      Indication   DULoxetine 30 MG capsule  Commonly known as:   CYMBALTA  Take 1 capsule (30 mg total) by mouth 2 (two) times daily.      lisinopril 10 MG tablet  Commonly known as:  PRINIVIL,ZESTRIL  Take 1 tablet (10 mg total) by mouth daily.   Indication:  High Blood Pressure     traZODone 50 MG tablet  Commonly known as:  DESYREL  Take 1 tablet (50 mg total) by mouth at bedtime and may repeat dose one time if needed.   Indication:  Trouble Sleeping       Follow-up Information    Follow up with [facility] On 01/28/2016.   Why:  Thursday at 2:00pm for your hospital discharge appointment. Please arrive at 1:30pm to complete paperwork and bring your ID and insurance card.   Contact information:   [address] [place], [place]  [address] Ph:  [phone] (fax) [phone]      Follow-up recommendations:  Activity:  as tol Diet:  as tol  Comments:  1.  Take all your medications as prescribed.              2.  Report any adverse side effects to outpatient provider.                       3.  Patient instructed to not use alcohol or illegal drugs while on prescription medicines.            4.  In the event of worsening symptoms, instructed patient to call 911, the crisis hotline or go to nearest emergency room for evaluation of symptoms.  Signed: Janett Labella, NP [facility] 01/25/2016, 11:10 AM

## 2016-01-25 NOTE — Progress Notes (Signed)
D: Pt is alert and oriented x4. Pt continues endorse moderate depression and moderate anxiety; she states, "I am getting there, I will rate them about a 4 each." Pt continues to isolate self to room. Pt however, denies pain, AVH and SI/HI. Pt is very flat. Pt remained calm and cooperative through the shift assessment. A: Pt was encouraged to attend group. Medications administered as prescribed.  Support, encouragement, and safe environment provided.  15-minute safety checks continue. R: Pt refused scheduled sleep medication; states, "I can sleep without it."  Pt attended wrap-up group. Safety checks continue.

## 2016-03-03 ENCOUNTER — Encounter (HOSPITAL_COMMUNITY): Payer: Self-pay

## 2016-03-03 ENCOUNTER — Emergency Department (HOSPITAL_COMMUNITY): Payer: Medicaid Other

## 2016-03-03 ENCOUNTER — Emergency Department (HOSPITAL_COMMUNITY)
Admission: EM | Admit: 2016-03-03 | Discharge: 2016-03-03 | Disposition: A | Discharge status: Home / Self Care | Payer: Medicaid Other | Attending: Emergency Medicine | Admitting: Emergency Medicine

## 2016-03-03 DIAGNOSIS — I10 Essential (primary) hypertension: Secondary | ICD-10-CM | POA: Diagnosis not present

## 2016-03-03 DIAGNOSIS — R0602 Shortness of breath: Secondary | ICD-10-CM | POA: Diagnosis not present

## 2016-03-03 DIAGNOSIS — F329 Major depressive disorder, single episode, unspecified: Secondary | ICD-10-CM | POA: Insufficient documentation

## 2016-03-03 DIAGNOSIS — F17200 Nicotine dependence, unspecified, uncomplicated: Secondary | ICD-10-CM | POA: Diagnosis not present

## 2016-03-03 DIAGNOSIS — R42 Dizziness and giddiness: Secondary | ICD-10-CM | POA: Diagnosis not present

## 2016-03-03 DIAGNOSIS — Z79899 Other long term (current) drug therapy: Secondary | ICD-10-CM | POA: Diagnosis not present

## 2016-03-03 LAB — CBC WITH DIFFERENTIAL/PLATELET
Basophils Absolute: 0 10*3/uL (ref 0.0–0.1)
Basophils Relative: 0 %
EOS ABS: 0.1 10*3/uL (ref 0.0–0.7)
Eosinophils Relative: 2 %
HEMATOCRIT: 43.9 % (ref 36.0–46.0)
HEMOGLOBIN: 14.8 g/dL (ref 12.0–15.0)
Lymphocytes Relative: 60 %
Lymphs Abs: 2.9 10*3/uL (ref 0.7–4.0)
MCH: 28.9 pg (ref 26.0–34.0)
MCHC: 33.7 g/dL (ref 30.0–36.0)
MCV: 85.7 fL (ref 78.0–100.0)
MONOS PCT: 9 %
Monocytes Absolute: 0.5 10*3/uL (ref 0.1–1.0)
NEUTROS PCT: 29 %
Neutro Abs: 1.4 10*3/uL — ABNORMAL LOW (ref 1.7–7.7)
Platelets: 196 10*3/uL (ref 150–400)
RBC: 5.12 MIL/uL — ABNORMAL HIGH (ref 3.87–5.11)
RDW: 13 % (ref 11.5–15.5)
WBC: 4.8 10*3/uL (ref 4.0–10.5)

## 2016-03-03 LAB — BASIC METABOLIC PANEL
Anion gap: 9 (ref 5–15)
BUN: 24 mg/dL — AB (ref 6–20)
CHLORIDE: 106 mmol/L (ref 101–111)
CO2: 24 mmol/L (ref 22–32)
CREATININE: 0.82 mg/dL (ref 0.44–1.00)
Calcium: 9.7 mg/dL (ref 8.9–10.3)
GFR calc Af Amer: >60 mL/min (ref >60)
GFR calc non Af Amer: >60 mL/min (ref >60)
GLUCOSE: 96 mg/dL (ref 65–99)
Potassium: 4 mmol/L (ref 3.5–5.1)
Sodium: 139 mmol/L (ref 135–145)

## 2016-03-03 NOTE — Discharge Instructions (Signed)
Make an appointment to follow-up with your primary care doctor. Today's labs and chest x-ray are normal. Return for any new or worse symptoms.

## 2016-03-03 NOTE — ED Provider Notes (Signed)
CSN: [id number]     Arrival date & time 03/03/16  1249 History   First MD Initiated Contact with Patient 03/03/16 1317     Chief Complaint  Patient presents with  . Dizziness     (Consider location/radiation/quality/duration/timing/severity/associated sxs/prior Treatment) Patient is a 55 y.o. female presenting with dizziness. The history is provided by the patient.  Dizziness Associated symptoms: shortness of breath   Associated symptoms: no chest pain, no diarrhea, no nausea, no vomiting and no weakness    55 year old female recently started on Cymbalta back in March and also with the 2 new blood pressure medicines as more recently followed by Dr. Legrand Rams. Patient with a feeling of some dizziness no room spinning lightheadedness dizziness is mild. Unassociated shortness of breath. No pain in the chest or abdomen. No weakness or numbness. Patient is concerned it may be due to her medications.  Past Medical History  Diagnosis Date  . Hypertension   . Bronchitis   . Headache   . Depression   . Cocaine abuse   . MDD (major depressive disorder) [facility])    Past Surgical History  Procedure Laterality Date  . Cysts removed from armpits bilaterally    . Colonoscopy with propofol N/A 06/23/2015    SLF: 1. Rectal bleeding/pain due to hemorrhoids2. one large and one small colorectal polyp removed.   . Polypectomy N/A 06/23/2015    Procedure: POLYPECTOMY;  Surgeon: Danie Binder, MD;  Location: [facility];  Service: Endoscopy;  Laterality: N/A;  descending colon   Family History  Problem Relation Age of Onset  . Heart failure Mother   . Coronary artery disease Mother   . Heart failure Father   . Coronary artery disease Father   . Colon cancer Neg Hx   . Mental illness Neg Hx    Social History  Substance Use Topics  . Smoking status: Current Some Day Smoker -- 0.00 packs/day    Types: Cigarettes  . Smokeless tobacco: Never Used     Comment: vague reports of smoking intermittently   . Alcohol  Use: 12.0 oz/week    20 Cans of beer per week     Comment: 2 40 oz beer daily    OB History    Gravida Para Term Preterm AB TAB SAB Ectopic Multiple Living   5 3 2 1 2  2         Review of Systems  Constitutional: Negative for fever.  HENT: Negative for congestion.   Eyes: Negative for visual disturbance.  Respiratory: Positive for shortness of breath. Negative for cough.   Cardiovascular: Negative for chest pain.  Gastrointestinal: Negative for nausea, vomiting, abdominal pain and diarrhea.  Genitourinary: Negative for dysuria.  Musculoskeletal: Negative for myalgias.  Skin: Negative for rash.  Neurological: Positive for dizziness and light-headedness. Negative for weakness and numbness.  Hematological: Does not bruise/bleed easily.  Psychiatric/Behavioral: Negative for confusion.      Allergies  Review of patient's allergies indicates no known allergies.  Home Medications   Prior to Admission medications   Medication Sig Start Date End Date Taking? Authorizing Provider  DULoxetine (CYMBALTA) 30 MG capsule Take 1 capsule (30 mg total) by mouth 2 (two) times daily. 01/25/16  Yes Kerrie Buffalo, NP  lisinopril (PRINIVIL,ZESTRIL) 10 MG tablet Take 1 tablet (10 mg total) by mouth daily. 01/25/16  Yes Kerrie Buffalo, NP  traZODone (DESYREL) 50 MG tablet Take 1 tablet (50 mg total) by mouth at bedtime and may repeat dose one time if needed.  01/25/16  Yes Kerrie Buffalo, NP   BP 106/82 mmHg  Pulse 82  Temp(Src) 97.5 F (36.4 C) (Oral)  Resp 18  SpO2 100% Physical Exam  Constitutional: [name], place, and time. She appears well-developed and well-nourished. No distress.  HENT:  Head: Normocephalic and atraumatic.  Mouth/Throat: Oropharynx is clear and moist.  Eyes: Conjunctivae and EOM are normal. Pupils are equal, round, and reactive to light.  Neck: Normal range of motion. Neck supple.  Cardiovascular: Normal rate, regular rhythm and normal heart sounds.   No  murmur heard. Pulmonary/Chest: Effort normal and breath sounds normal. No respiratory distress. She has no wheezes. She has no rales.  Abdominal: Soft. Bowel sounds are normal. There is no tenderness.  Musculoskeletal: Normal range of motion. She exhibits no edema.  Neurological: She is alert and oriented to person, place, and time. No cranial nerve deficit. She exhibits normal muscle tone. Coordination normal.  Skin: Skin is warm. No rash noted.  Nursing note and vitals reviewed.   ED Course  Procedures (including critical care time) Labs Review Labs Reviewed  CBC WITH DIFFERENTIAL/PLATELET - Abnormal; Notable for the following:    RBC 5.12 (*)    Neutro Abs 1.4 (*)    All other components within normal limits  BASIC METABOLIC PANEL - Abnormal; Notable for the following:    BUN 24 (*)    All other components within normal limits   Results for orders placed or performed during the hospital encounter of 03/03/16  CBC with Differential/Platelet  Result Value Ref Range   WBC 4.8 4.0 - 10.5 K/uL   RBC 5.12 (H) 3.87 - 5.11 MIL/uL   Hemoglobin 14.8 12.0 - 15.0 g/dL   HCT 43.9 36.0 - 46.0 %   MCV 85.7 78.0 - 100.0 fL   MCH 28.9 26.0 - 34.0 pg   MCHC 33.7 30.0 - 36.0 g/dL   RDW 13.0 11.5 - 15.5 %   Platelets 196 150 - 400 K/uL   Neutrophils Relative % 29 %   Neutro Abs 1.4 (L) 1.7 - 7.7 K/uL   Lymphocytes Relative 60 %   Lymphs Abs 2.9 0.7 - 4.0 K/uL   Monocytes Relative 9 %   Monocytes Absolute 0.5 0.1 - 1.0 K/uL   Eosinophils Relative 2 %   Eosinophils Absolute 0.1 0.0 - 0.7 K/uL   Basophils Relative 0 %   Basophils Absolute 0.0 0.0 - 0.1 K/uL  Basic metabolic panel  Result Value Ref Range   Sodium 139 135 - 145 mmol/L   Potassium 4.0 3.5 - 5.1 mmol/L   Chloride 106 101 - 111 mmol/L   CO2 24 22 - 32 mmol/L   Glucose, Bld 96 65 - 99 mg/dL   BUN 24 (H) 6 - 20 mg/dL   Creatinine, Ser 0.82 0.44 - 1.00 mg/dL   Calcium 9.7 8.9 - 10.3 mg/dL   GFR calc non Af Amer >60 >60  mL/min   GFR calc Af Amer >60 >60 mL/min   Anion gap 9 5 - 15     Imaging Review Dg Chest 2 View  03/03/2016  CLINICAL DATA:  Shortness of breath 1 week. EXAM: CHEST  2 VIEW COMPARISON:  08/27/2015 FINDINGS: Lungs are adequately inflated without focal consolidation or effusion. Cardiomediastinal silhouette is within normal. There are mild degenerative changes of the spine. Several old left lateral rib fractures. IMPRESSION: No active cardiopulmonary disease. Electronically Signed   By: Marin Olp M.D.   On: 03/03/2016 14:21  I have personally reviewed and evaluated these images and lab results as part of my medical decision-making.   EKG Interpretation   Date/Time:  Thursday March 03 2016 13:03:42 EDT Ventricular Rate:  59 PR Interval:  129 QRS Duration: 108 QT Interval:  455 QTC Calculation: 451 R Axis:   87 Text Interpretation:  Sinus rhythm Nonspecific T abnormalities, lateral  leads Confirmed by Tullio Chausse  MD, Kody Brandl ([id number]) on 03/03/2016 1:09:13 PM      MDM   Final diagnoses:  Dizziness    The patient with several complaints main ones being not dizziness without vertigo feeling a little lightheaded and short of breath. Room air sats are in the upper 90s. Chest x-rays negative for pneumonia pneumothorax or pulmonary edema. Basic labs are normal. Cymbalta can have a side effect of dizziness. Patient does not want to stop but says not that severe she will follow-up with her regular doctor for further discussion about the meds. Recently added with some new antihypertensive meds blood pressure is fine here but that could be causing some of her symptoms. Patient not showing any evidence of orthostasis.    [name], MD 03/03/16 [phone]

## 2016-03-03 NOTE — ED Notes (Signed)
Pt reports her doctor recently put her on cymbalta and 2 bp pills.  Reports since then she has been getting lightheaded and SOB.

## 2016-03-03 NOTE — ED Notes (Signed)
Pt in room talking with another pt.  Unable to get pt to go back into room for vitals

## 2016-03-10 ENCOUNTER — Encounter (HOSPITAL_COMMUNITY): Payer: Self-pay

## 2016-03-10 ENCOUNTER — Emergency Department (HOSPITAL_COMMUNITY)
Admission: EM | Admit: 2016-03-10 | Discharge: 2016-03-10 | Disposition: A | Discharge status: Home / Self Care | Payer: Medicaid Other | Attending: Emergency Medicine | Admitting: Emergency Medicine

## 2016-03-10 DIAGNOSIS — I10 Essential (primary) hypertension: Secondary | ICD-10-CM | POA: Insufficient documentation

## 2016-03-10 DIAGNOSIS — F329 Major depressive disorder, single episode, unspecified: Secondary | ICD-10-CM | POA: Diagnosis not present

## 2016-03-10 DIAGNOSIS — Z79899 Other long term (current) drug therapy: Secondary | ICD-10-CM | POA: Diagnosis not present

## 2016-03-10 DIAGNOSIS — F17210 Nicotine dependence, cigarettes, uncomplicated: Secondary | ICD-10-CM | POA: Insufficient documentation

## 2016-03-10 DIAGNOSIS — K0889 Other specified disorders of teeth and supporting structures: Secondary | ICD-10-CM | POA: Insufficient documentation

## 2016-03-10 MED ORDER — AMOXICILLIN 500 MG PO CAPS
500.0000 mg | ORAL_CAPSULE | Freq: Three times a day (TID) | ORAL | Status: DC
Start: 2016-03-10 — End: 2017-04-08

## 2016-03-10 MED ORDER — DICLOFENAC SODIUM 50 MG PO TBEC: 50.0000 mg | DELAYED_RELEASE_TABLET | Freq: Two times a day (BID) | ORAL | Status: DC

## 2016-03-10 NOTE — ED Notes (Signed)
Rt upper dental pain that began today.

## 2016-03-10 NOTE — Discharge Instructions (Signed)

## 2016-03-10 NOTE — ED Provider Notes (Signed)
CSN: [id number]     Arrival date & time 03/10/16  1154 History   First MD Initiated Contact with Patient 03/10/16 1249     Chief Complaint  Patient presents with  . Dental Pain     (Consider location/radiation/quality/duration/timing/severity/associated sxs/prior Treatment) Patient is a 55 y.o. female presenting with tooth pain. The history is provided by the patient. No language interpreter was used.  Dental Pain Location:  Upper and generalized Quality:  Aching Severity:  Severe Progression:  Worsening Chronicity:  New Relieved by:  Nothing Worsened by:  Nothing tried Pt has an appointment to see the dentist next week.   Past Medical History  Diagnosis Date  . Hypertension   . Bronchitis   . Headache   . Depression   . Cocaine abuse   . MDD (major depressive disorder) [facility])    Past Surgical History  Procedure Laterality Date  . Cysts removed from armpits bilaterally    . Colonoscopy with propofol N/A 06/23/2015    SLF: 1. Rectal bleeding/pain due to hemorrhoids2. one large and one small colorectal polyp removed.   . Polypectomy N/A 06/23/2015    Procedure: POLYPECTOMY;  Surgeon: Danie Binder, MD;  Location: [facility];  Service: Endoscopy;  Laterality: N/A;  descending colon   Family History  Problem Relation Age of Onset  . Heart failure Mother   . Coronary artery disease Mother   . Heart failure Father   . Coronary artery disease Father   . Colon cancer Neg Hx   . Mental illness Neg Hx    Social History  Substance Use Topics  . Smoking status: Current Some Day Smoker -- 0.00 packs/day    Types: Cigarettes  . Smokeless tobacco: Never Used     Comment: vague reports of smoking intermittently   . Alcohol Use: 12.0 oz/week    20 Cans of beer per week     Comment: 2 40 oz beer daily    OB History    Gravida Para Term Preterm AB TAB SAB Ectopic Multiple Living   5 3 2 1 2  2         Review of Systems  All other systems reviewed and are  negative.     Allergies  Review of patient's allergies indicates no known allergies.  Home Medications   Prior to Admission medications   Medication Sig Start Date End Date Taking? Authorizing Provider  DULoxetine (CYMBALTA) 30 MG capsule Take 1 capsule (30 mg total) by mouth 2 (two) times daily. 01/25/16  Yes Kerrie Buffalo, NP  lisinopril (PRINIVIL,ZESTRIL) 10 MG tablet Take 1 tablet (10 mg total) by mouth daily. 01/25/16  Yes Kerrie Buffalo, NP  traZODone (DESYREL) 50 MG tablet Take 1 tablet (50 mg total) by mouth at bedtime and may repeat dose one time if needed. 01/25/16  Yes Kerrie Buffalo, NP  amoxicillin (AMOXIL) 500 MG capsule Take 1 capsule (500 mg total) by mouth 3 (three) times daily. 03/10/16   Fransico Meadow, PA-C  diclofenac (VOLTAREN) 50 MG EC tablet Take 1 tablet (50 mg total) by mouth 2 (two) times daily. 03/10/16   Hollace Kinnier Aarush Stukey, PA-C   BP 130/85 mmHg  Pulse 66  Temp(Src) 98.6 F (37 C)  Resp 16  Wt 72.349 kg  SpO2 100% Physical Exam  Constitutional: [name], place, and time. She appears well-developed and well-nourished.  HENT:  Head: Normocephalic and atraumatic.  Eyes: EOM are normal. Pupils are equal, round, and reactive to light.  Neck: Normal range of motion.  Cardiovascular: Normal rate.   Pulmonary/Chest: Effort normal.  Abdominal: She exhibits no distension.  Musculoskeletal: Normal range of motion.  Neurological: She is alert and oriented to person, place, and time.  Skin: Skin is warm.  Psychiatric: She has a normal mood and affect.  Nursing note and vitals reviewed.   ED Course  Procedures (including critical care time) Labs Review Labs Reviewed - No data to display  Imaging Review No results found. I have personally reviewed and evaluated these images and lab results as part of my medical decision-making.   EKG Interpretation None      MDM i do not see an abscess.  Pt given rx for voltaren and amoxicillian.     Final  diagnoses:  Toothache   An After Visit Summary was printed and given to the patient.    Hollace Kinnier [place], PA-C 03/10/16 [place], MD 03/11/16 [phone]

## 2016-03-16 ENCOUNTER — Other Ambulatory Visit (HOSPITAL_COMMUNITY): Payer: Self-pay | Admitting: Internal Medicine

## 2016-03-16 DIAGNOSIS — R634 Abnormal weight loss: Secondary | ICD-10-CM

## 2016-03-16 DIAGNOSIS — R1011 Right upper quadrant pain: Secondary | ICD-10-CM

## 2016-03-22 ENCOUNTER — Ambulatory Visit (HOSPITAL_COMMUNITY): Payer: Medicaid Other

## 2016-03-25 ENCOUNTER — Ambulatory Visit (HOSPITAL_COMMUNITY)
Admission: RE | Admit: 2016-03-25 | Discharge: 2016-03-25 | Disposition: A | Discharge status: Home / Self Care | Payer: Medicaid Other | Source: Ambulatory Visit | Attending: Internal Medicine | Admitting: Internal Medicine

## 2016-03-25 ENCOUNTER — Ambulatory Visit (HOSPITAL_COMMUNITY): Payer: Medicaid Other

## 2016-03-25 DIAGNOSIS — R1011 Right upper quadrant pain: Secondary | ICD-10-CM

## 2016-03-25 DIAGNOSIS — R634 Abnormal weight loss: Secondary | ICD-10-CM | POA: Insufficient documentation

## 2016-03-25 DIAGNOSIS — R109 Unspecified abdominal pain: Secondary | ICD-10-CM | POA: Diagnosis present

## 2016-03-26 ENCOUNTER — Encounter (HOSPITAL_COMMUNITY): Payer: Self-pay | Admitting: Emergency Medicine

## 2016-03-26 ENCOUNTER — Emergency Department (HOSPITAL_COMMUNITY)
Admission: EM | Admit: 2016-03-26 | Discharge: 2016-03-26 | Disposition: A | Discharge status: Home / Self Care | Payer: Medicaid Other | Attending: Dermatology | Admitting: Dermatology

## 2016-03-26 DIAGNOSIS — Z5321 Procedure and treatment not carried out due to patient leaving prior to being seen by health care provider: Secondary | ICD-10-CM | POA: Diagnosis not present

## 2016-03-26 DIAGNOSIS — I10 Essential (primary) hypertension: Secondary | ICD-10-CM | POA: Diagnosis not present

## 2016-03-26 DIAGNOSIS — L299 Pruritus, unspecified: Secondary | ICD-10-CM | POA: Insufficient documentation

## 2016-03-26 DIAGNOSIS — F329 Major depressive disorder, single episode, unspecified: Secondary | ICD-10-CM | POA: Insufficient documentation

## 2016-03-26 DIAGNOSIS — F17210 Nicotine dependence, cigarettes, uncomplicated: Secondary | ICD-10-CM | POA: Insufficient documentation

## 2016-03-26 NOTE — ED Notes (Signed)
Pt requesting socks, states that her feet are better and that she is leaving

## 2016-03-26 NOTE — ED Notes (Signed)
Pt states her feet are itching.  Pt actively scratching at triage causing redness but no rash noted.

## 2016-03-31 ENCOUNTER — Encounter: Payer: Self-pay | Admitting: Gastroenterology

## 2016-05-01 IMAGING — CR DG LUMBAR SPINE COMPLETE 4+V
5 series · 5 of 5 positions shown · non-contrast
Comparison: None.

CLINICAL DATA: Back pain for 2 days

EXAM:
LUMBAR SPINE - COMPLETE 4+ VIEW

[view not recorded (1 of 5)]
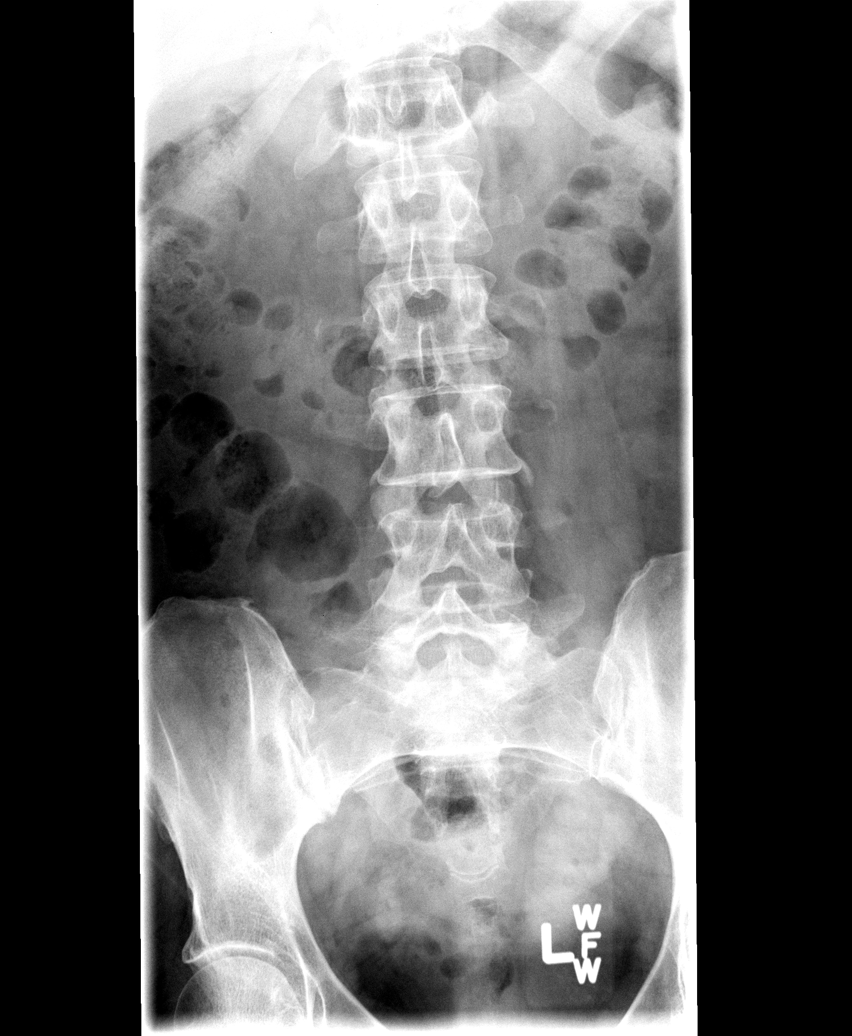

[view not recorded (2 of 5)]
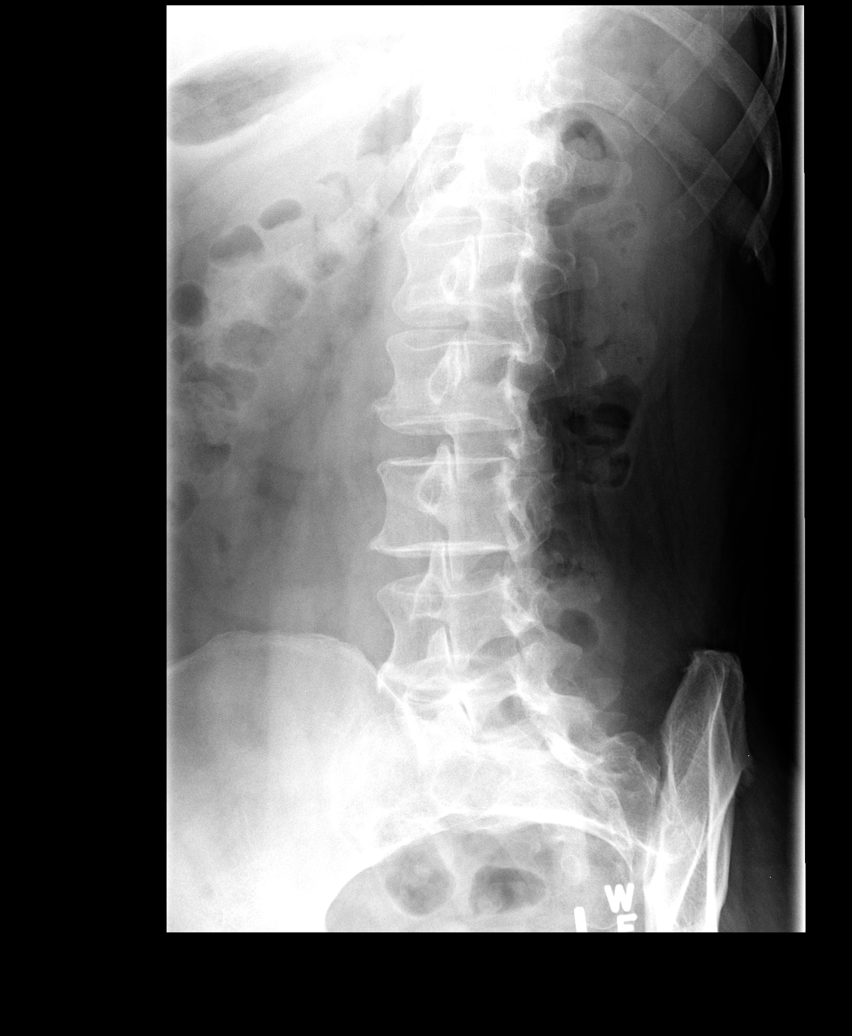

[view not recorded (3 of 5)]
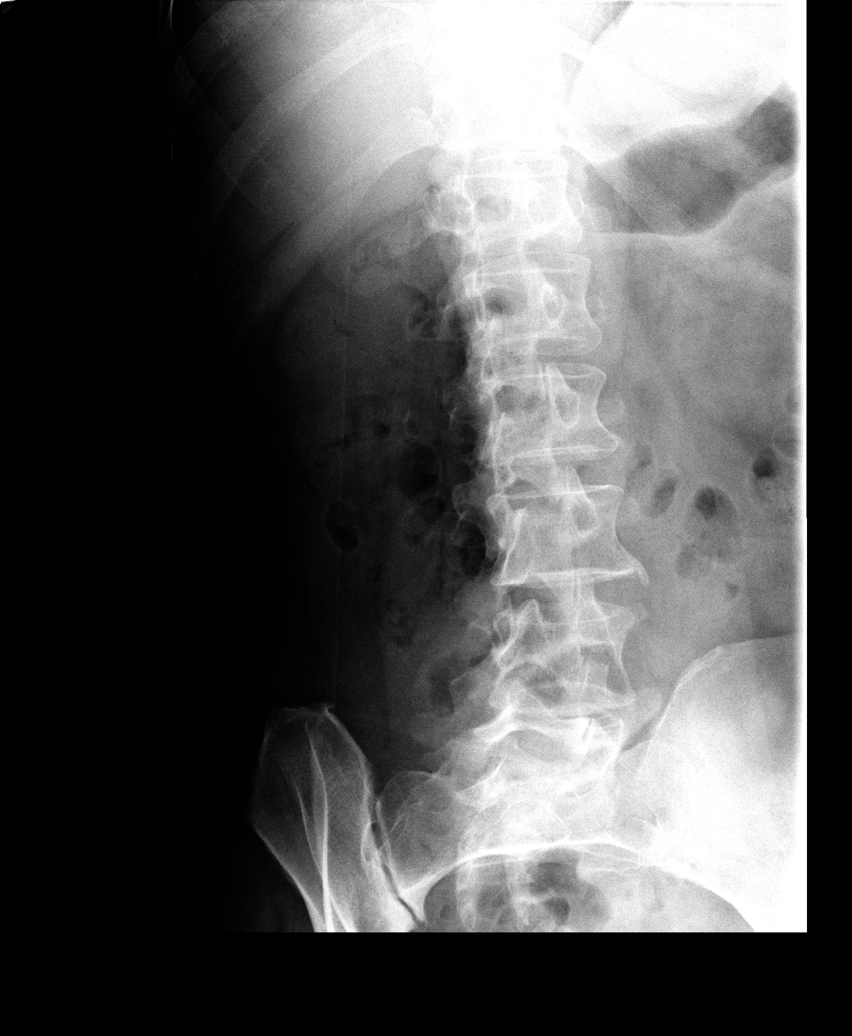

[view not recorded (4 of 5)]
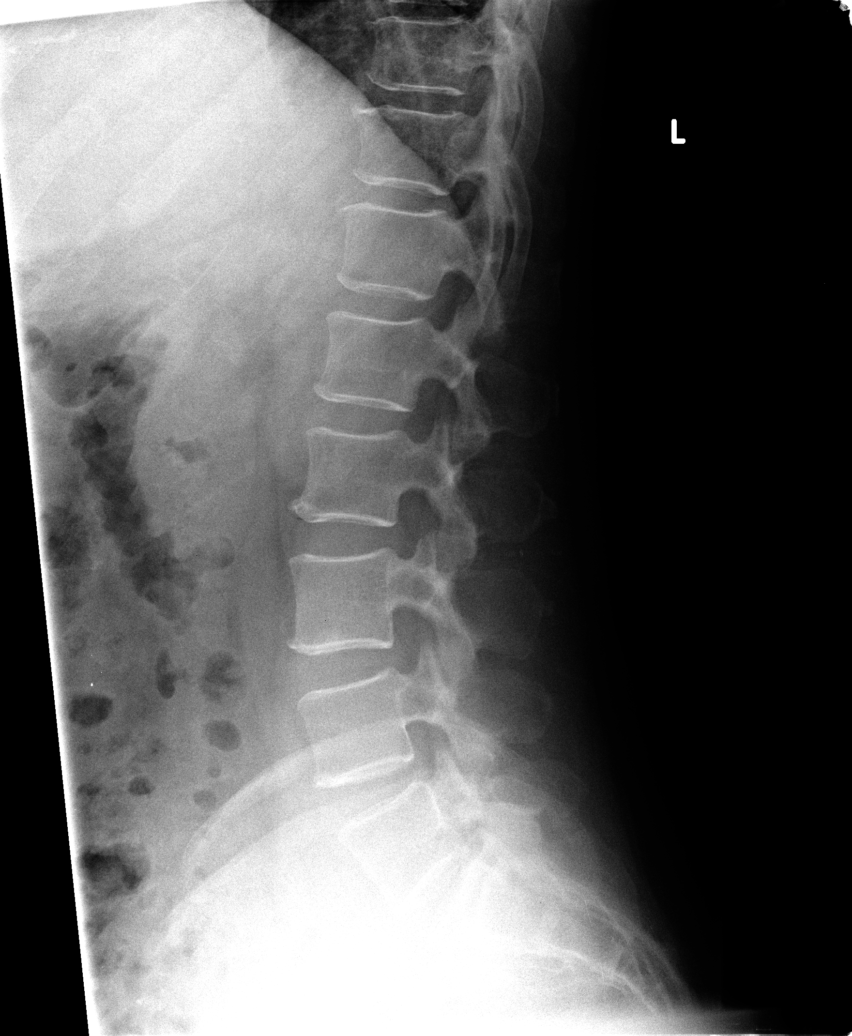

[view not recorded (5 of 5)]
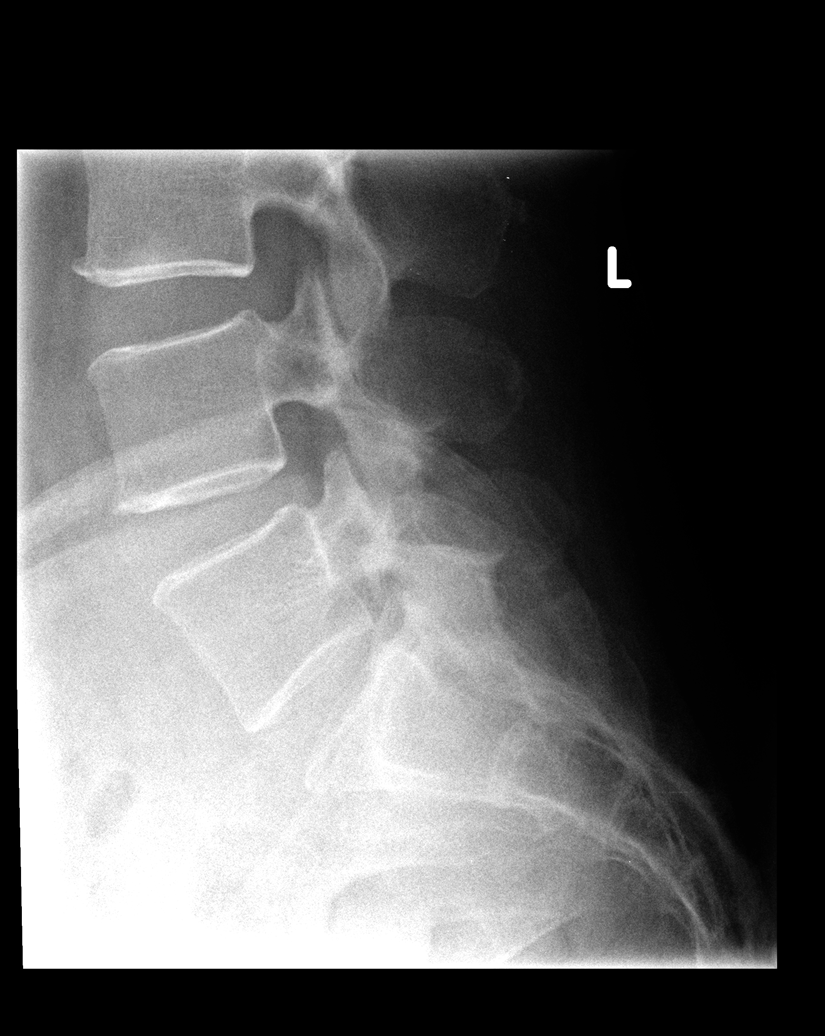

[5 of 5 positions shown; findings below may reference images not displayed]

FINDINGS: Five views of lumbar spine submitted. No acute fracture or
subluxation. Mild anterior spurring lower endplate of L2 and L3
vertebral body. Alignment and vertebral body heights are preserved.
There is mild disc space flattening at L5-S1 level.
IMPRESSION: No acute fracture or subluxation. Mild disc space flattening at
L5-S1 level.

## 2016-05-11 ENCOUNTER — Emergency Department (HOSPITAL_COMMUNITY)
Admission: EM | Admit: 2016-05-11 | Discharge: 2016-05-11 | Disposition: A | Discharge status: Home / Self Care | Payer: Medicaid Other | Attending: Emergency Medicine | Admitting: Emergency Medicine

## 2016-05-11 ENCOUNTER — Encounter (HOSPITAL_COMMUNITY): Payer: Self-pay

## 2016-05-11 DIAGNOSIS — I10 Essential (primary) hypertension: Secondary | ICD-10-CM | POA: Insufficient documentation

## 2016-05-11 DIAGNOSIS — N390 Urinary tract infection, site not specified: Secondary | ICD-10-CM | POA: Insufficient documentation

## 2016-05-11 DIAGNOSIS — F17210 Nicotine dependence, cigarettes, uncomplicated: Secondary | ICD-10-CM | POA: Insufficient documentation

## 2016-05-11 LAB — RAPID URINE DRUG SCREEN, HOSP PERFORMED
Amphetamines: NOT DETECTED
Barbiturates: NOT DETECTED
Benzodiazepines: NOT DETECTED
Cocaine: NOT DETECTED
Opiates: NOT DETECTED
Tetrahydrocannabinol: NOT DETECTED

## 2016-05-11 LAB — URINALYSIS, ROUTINE W REFLEX MICROSCOPIC
Bilirubin Urine: NEGATIVE
Glucose, UA: NEGATIVE mg/dL
Hgb urine dipstick: NEGATIVE
Ketones, ur: NEGATIVE mg/dL
Leukocytes, UA: NEGATIVE
Nitrite: POSITIVE — AB
Protein, ur: NEGATIVE mg/dL
Specific Gravity, Urine: 1.025 (ref 1.005–1.030)
pH: 5 (ref 5.0–8.0)

## 2016-05-11 LAB — URINE MICROSCOPIC-ADD ON: WBC, UA: NONE SEEN WBC/hpf (ref 0–5)

## 2016-05-11 MED ORDER — CEPHALEXIN 500 MG PO CAPS: 500.0000 mg | ORAL_CAPSULE | Freq: Four times a day (QID) | ORAL | Status: DC

## 2016-05-11 MED ORDER — PHENAZOPYRIDINE HCL 200 MG PO TABS: 200.0000 mg | ORAL_TABLET | Freq: Three times a day (TID) | ORAL | Status: DC

## 2016-05-11 NOTE — ED Provider Notes (Signed)
CSN: [id number]     Arrival date & time 05/11/16  [id number] History   First MD Initiated Contact with Patient 05/11/16 [phone]     Chief Complaint  Patient presents with  . Dysuria     (Consider location/radiation/quality/duration/timing/severity/associated sxs/prior Treatment) HPI   Becky [name] is a 55 y.o. female who presents to the Emergency Department complaining of pain and burning with urination for 2 days. She reports urinary frequency and lower abdominal pain associated with urination. She states that she has frequent urinary tract infections and pain feels similar to previous. She also states that she accidentally smoked someone else's cigarette 2 days ago and believes that it was "laced" with some type of drug and requested have her urine drug tested. She denies fever, back pain, vomiting, vaginal bleeding or discharge. No new sexual partners.   Past Medical History  Diagnosis Date  . Hypertension   . Bronchitis   . Headache   . Depression   . Cocaine abuse   . MDD (major depressive disorder) [facility])    Past Surgical History  Procedure Laterality Date  . Cysts removed from armpits bilaterally    . Colonoscopy with propofol N/A 06/23/2015    SLF: 1. Rectal bleeding/pain due to hemorrhoids2. one large and one small colorectal polyp removed.   . Polypectomy N/A 06/23/2015    Procedure: POLYPECTOMY;  Surgeon: Danie Binder, MD;  Location: [facility];  Service: Endoscopy;  Laterality: N/A;  descending colon   Family History  Problem Relation Age of Onset  . Heart failure Mother   . Coronary artery disease Mother   . Heart failure Father   . Coronary artery disease Father   . Colon cancer Neg Hx   . Mental illness Neg Hx    Social History  Substance Use Topics  . Smoking status: Current Some Day Smoker -- 0.50 packs/day    Types: Cigarettes  . Smokeless tobacco: Never Used     Comment: vague reports of smoking intermittently   . Alcohol Use: 12.0 oz/week    20 Cans of beer per  week     Comment: 2 40 oz beer daily    OB History    Gravida Para Term Preterm AB TAB SAB Ectopic Multiple Living   5 3 2 1 2  2         Review of Systems  Constitutional: Negative for fever, chills, activity change and appetite change.  Respiratory: Negative for chest tightness and shortness of breath.   Gastrointestinal: Negative for nausea and vomiting.  Genitourinary: Positive for dysuria, urgency, frequency and pelvic pain. Negative for hematuria, flank pain, decreased urine volume, vaginal bleeding, vaginal discharge and difficulty urinating.       Burning with urination  Musculoskeletal: Negative for back pain.  Skin: Negative for rash.  Neurological: Negative for dizziness, weakness and numbness.  Hematological: Negative for adenopathy.  Psychiatric/Behavioral: Negative for confusion.  All other systems reviewed and are negative.     Allergies  Review of patient's allergies indicates no known allergies.  Home Medications   Prior to Admission medications   Medication Sig Start Date End Date Taking? Authorizing Provider  amoxicillin (AMOXIL) 500 MG capsule Take 1 capsule (500 mg total) by mouth 3 (three) times daily. 03/10/16   Fransico Meadow, PA-C  diclofenac (VOLTAREN) 50 MG EC tablet Take 1 tablet (50 mg total) by mouth 2 (two) times daily. 03/10/16   Fransico Meadow, PA-C  DULoxetine (CYMBALTA) 30 MG capsule Take 1  capsule (30 mg total) by mouth 2 (two) times daily. 01/25/16   Kerrie Buffalo, NP  lisinopril (PRINIVIL,ZESTRIL) 10 MG tablet Take 1 tablet (10 mg total) by mouth daily. 01/25/16   Kerrie Buffalo, NP  traZODone (DESYREL) 50 MG tablet Take 1 tablet (50 mg total) by mouth at bedtime and may repeat dose one time if needed. 01/25/16   Kerrie Buffalo, NP   BP 136/100 mmHg  Temp(Src) 97.9 F (36.6 C) (Oral)  Resp 20  Ht 5\' 9"  (1.753 m)  Wt 74.798 kg  BMI 24.34 kg/m2  SpO2 100% Physical Exam  Constitutional: [name], place, and time. She appears  well-developed and well-nourished. No distress.  HENT:  Head: Normocephalic and atraumatic.  Cardiovascular: Normal rate, regular rhythm, normal heart sounds and intact distal pulses.   No murmur heard. Pulmonary/Chest: Effort normal and breath sounds normal. No respiratory distress. She has no wheezes. She has no rales.  Abdominal: Soft. Normal appearance. She exhibits no distension and no mass. There is no hepatosplenomegaly. There is tenderness in the suprapubic area. There is no rigidity, no rebound, no guarding, no CVA tenderness and no tenderness at McBurney's point.  Mild ttp of the suprapubic region.  Remaining abdomen is soft, non-tender without guarding or rebound tenderness. No CVA tenderness  Musculoskeletal: Normal range of motion. She exhibits no edema.  Neurological: She is alert and oriented to person, place, and time. Coordination normal.  Skin: Skin is warm and dry. No rash noted.  Nursing note and vitals reviewed.   ED Course  Procedures (including critical care time) Labs Review Labs Reviewed  URINALYSIS, ROUTINE W REFLEX MICROSCOPIC (NOT AT [facility]) - Abnormal; Notable for the following:    Nitrite POSITIVE (*)    All other components within normal limits  URINE MICROSCOPIC-ADD ON - Abnormal; Notable for the following:    Squamous Epithelial / LPF TOO NUMEROUS TO COUNT (*)    Bacteria, UA MANY (*)    All other components within normal limits  URINE CULTURE  URINE RAPID DRUG SCREEN, HOSP PERFORMED    Imaging Review No results found. I have personally reviewed and evaluated these images and lab results as part of my medical decision-making.   EKG Interpretation None      MDM   Final diagnoses:  Acute UTI    Patient well appearing. Nontoxic. Suprapubic tenderness, dysuria with nitrite + U/A.   likely related to UTI. History of same. No fever, vomiting, or CVA tenderness to suggest pyelonephritis. Patient appears stable for discharge, urine culture pending,  prescription given for Keflex and Pyridium, she agrees to PMD follow-up for ER return if needed.    Kem Parkinson, PA-C 05/13/16 1303  Julianne Rice, MD 05/14/16 [phone]

## 2016-05-11 NOTE — Discharge Instructions (Signed)

## 2016-05-11 NOTE — ED Notes (Signed)
Pt reports that she has pain wiith urination for 2 days. States she smoke a cigarette and thinks someone put something in it.

## 2016-05-19 LAB — SUSCEPTIBILITY, AER + ANAEROB

## 2016-05-20 LAB — URINE CULTURE: Culture: >=100000 — AB

## 2016-05-21 ENCOUNTER — Telehealth (HOSPITAL_BASED_OUTPATIENT_CLINIC_OR_DEPARTMENT_OTHER): Payer: Self-pay

## 2016-05-21 NOTE — Telephone Encounter (Signed)
Post ED Visit - Positive Culture Follow-up  Culture report reviewed by antimicrobial stewardship pharmacist:  []  Elenor Quinones, Pharm.D. []  Heide Guile, Pharm.D., BCPS []  Parks Neptune, Pharm.D. []  Alycia Rossetti, Pharm.D., BCPS []  [place], Pharm.D., BCPS, AAHIVP []  Legrand Como, Pharm.D., BCPS, AAHIVP [x]  Milus Glazier, Pharm.D. []  Stephens November, Pharm.D.  Positive urine culture Treated with Cephalexin, organism sensitive to the same and no further patient follow-up is required at this time.  Genia Del 05/21/2016, 2:28 PM

## 2016-08-06 IMAGING — CR DG CHEST 1V PORT
1 series · 1 of 1 positions shown · non-contrast
Comparison: 11/09/2014 and earlier.

CLINICAL DATA: 53-year-old female with hyper ventilation and
shortness of Breath this morning. Initial encounter.

EXAM:
PORTABLE CHEST - 1 VIEW

[portable]
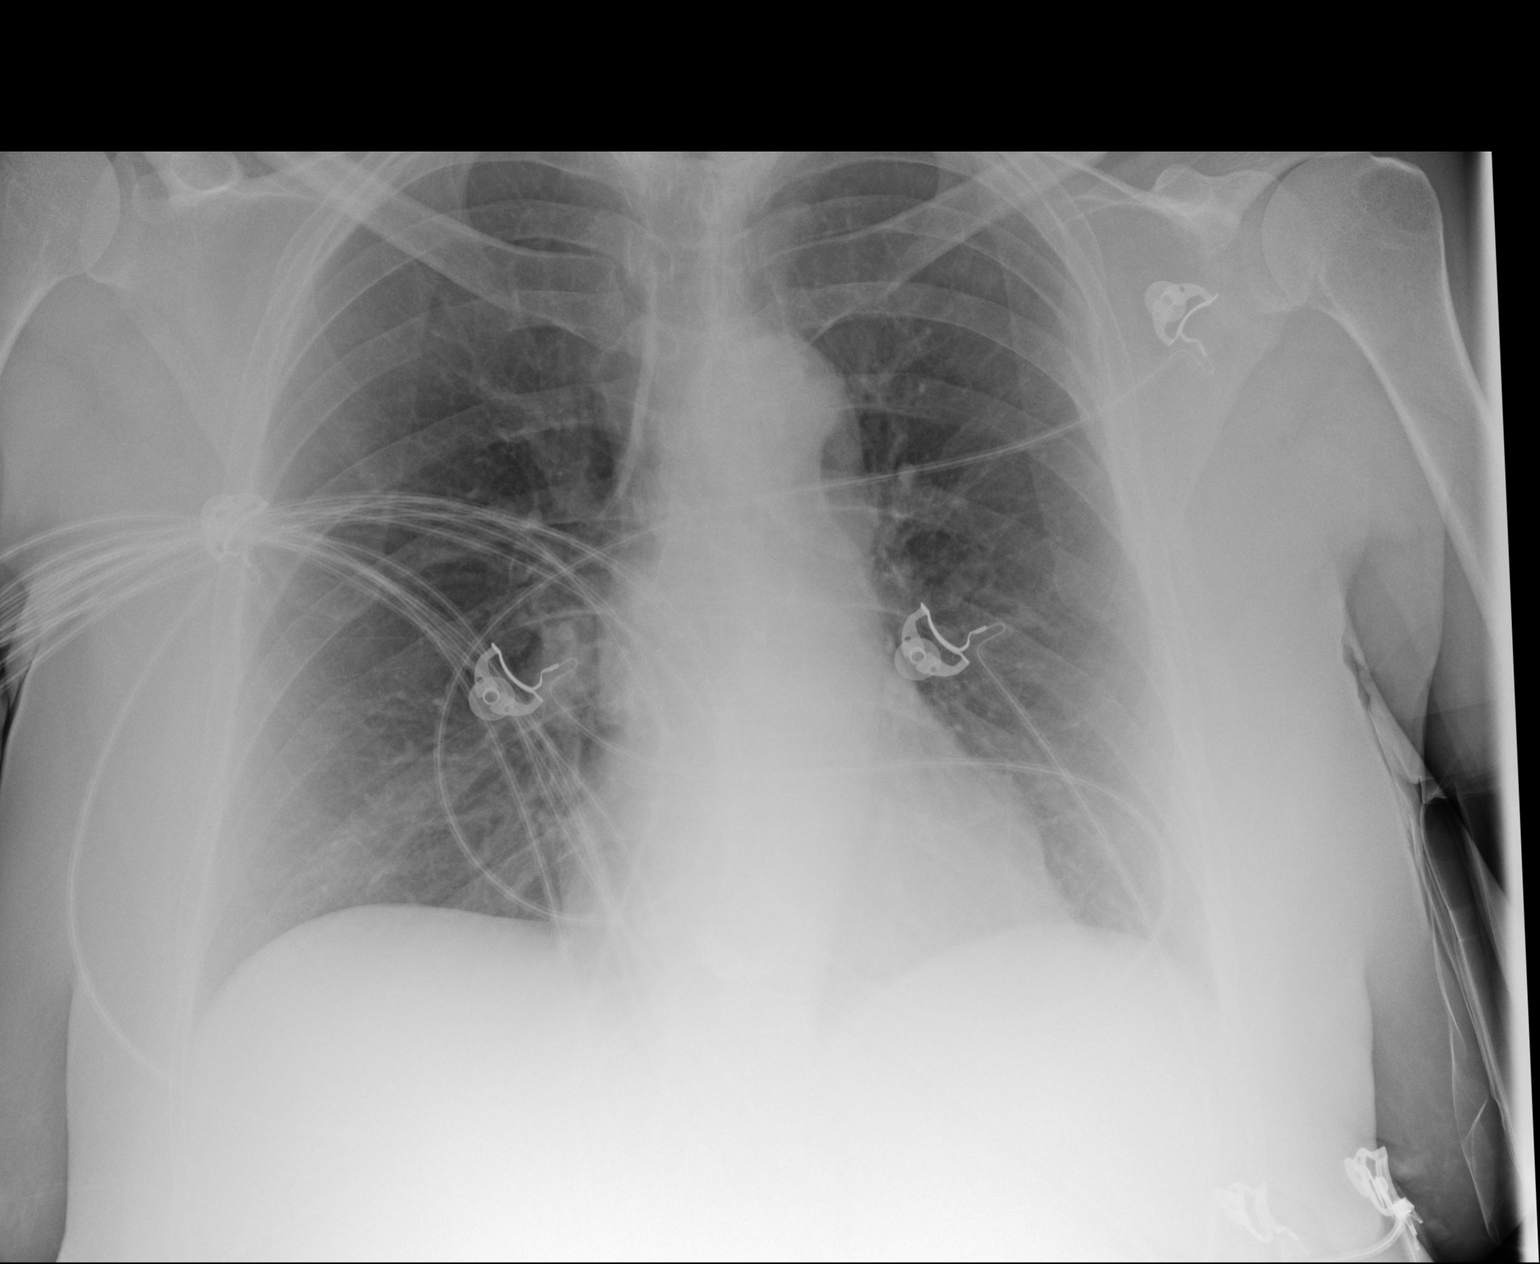

[1 of 1 positions shown; findings below may reference images not displayed]

FINDINGS: Portable AP upright view at 0977 hours. Mildly lower lung volumes.
Normal cardiac size and mediastinal contours. Visualized tracheal
air column is within normal limits. Allowing for portable technique,
the lungs are clear. No pneumothorax or pleural effusion.
IMPRESSION: No acute cardiopulmonary abnormality.

## 2016-09-25 LAB — SUSCEPTIBILITY RESULT

## 2016-10-10 ENCOUNTER — Emergency Department (HOSPITAL_COMMUNITY)
Admission: EM | Admit: 2016-10-10 | Discharge: 2016-10-10 | Disposition: A | Discharge status: Home / Self Care | Payer: Self-pay | Attending: Emergency Medicine | Admitting: Emergency Medicine

## 2016-10-10 ENCOUNTER — Emergency Department (HOSPITAL_COMMUNITY): Payer: Self-pay

## 2016-10-10 ENCOUNTER — Encounter (HOSPITAL_COMMUNITY): Payer: Self-pay | Admitting: Emergency Medicine

## 2016-10-10 DIAGNOSIS — R112 Nausea with vomiting, unspecified: Secondary | ICD-10-CM

## 2016-10-10 DIAGNOSIS — F17210 Nicotine dependence, cigarettes, uncomplicated: Secondary | ICD-10-CM | POA: Insufficient documentation

## 2016-10-10 DIAGNOSIS — R059 Cough, unspecified: Secondary | ICD-10-CM

## 2016-10-10 DIAGNOSIS — B349 Viral infection, unspecified: Secondary | ICD-10-CM | POA: Insufficient documentation

## 2016-10-10 DIAGNOSIS — R05 Cough: Secondary | ICD-10-CM

## 2016-10-10 DIAGNOSIS — R197 Diarrhea, unspecified: Secondary | ICD-10-CM

## 2016-10-10 DIAGNOSIS — Z79899 Other long term (current) drug therapy: Secondary | ICD-10-CM | POA: Insufficient documentation

## 2016-10-10 DIAGNOSIS — I10 Essential (primary) hypertension: Secondary | ICD-10-CM | POA: Insufficient documentation

## 2016-10-10 LAB — COMPREHENSIVE METABOLIC PANEL
ALBUMIN: 3.7 g/dL (ref 3.5–5.0)
ALK PHOS: 114 U/L (ref 38–126)
ALT: 113 U/L — AB (ref 14–54)
AST: 113 U/L — AB (ref 15–41)
Anion gap: 8 (ref 5–15)
BUN: 13 mg/dL (ref 6–20)
CALCIUM: 9 mg/dL (ref 8.9–10.3)
CHLORIDE: 110 mmol/L (ref 101–111)
CO2: 20 mmol/L — AB (ref 22–32)
CREATININE: 0.92 mg/dL (ref 0.44–1.00)
GFR calc Af Amer: >60 mL/min (ref >60)
GFR calc non Af Amer: >60 mL/min (ref >60)
GLUCOSE: 142 mg/dL — AB (ref 65–99)
Potassium: 3.6 mmol/L (ref 3.5–5.1)
SODIUM: 138 mmol/L (ref 135–145)
Total Bilirubin: 0.8 mg/dL (ref 0.3–1.2)
Total Protein: 7.1 g/dL (ref 6.5–8.1)

## 2016-10-10 LAB — CBC
HEMATOCRIT: 44.9 % (ref 36.0–46.0)
HEMOGLOBIN: 14.9 g/dL (ref 12.0–15.0)
MCH: 28.7 pg (ref 26.0–34.0)
MCHC: 33.2 g/dL (ref 30.0–36.0)
MCV: 86.5 fL (ref 78.0–100.0)
Platelets: 129 10*3/uL — ABNORMAL LOW (ref 150–400)
RBC: 5.19 MIL/uL — AB (ref 3.87–5.11)
RDW: 13.8 % (ref 11.5–15.5)
WBC: 3.3 10*3/uL — AB (ref 4.0–10.5)

## 2016-10-10 LAB — LIPASE, BLOOD: Lipase: 22 U/L (ref 11–51)

## 2016-10-10 MED ORDER — PROMETHAZINE-CODEINE 6.25-10 MG/5ML PO SYRP: 5.0000 mL | ORAL_SOLUTION | ORAL | 0 refills | Status: DC | PRN

## 2016-10-10 MED ORDER — PROMETHAZINE-CODEINE 6.25-10 MG/5ML PO SYRP
5.0000 mL | ORAL_SOLUTION | Freq: Once | ORAL | Status: AC
  Administered 2016-10-10: 5 mL via ORAL
  Filled 2016-10-10: qty 5

## 2016-10-10 MED ORDER — DIPHENOXYLATE-ATROPINE 2.5-0.025 MG PO TABS
2.0000 | ORAL_TABLET | Freq: Once | ORAL | Status: AC
  Administered 2016-10-10: 2 via ORAL
  Filled 2016-10-10: qty 2

## 2016-10-10 MED ORDER — SODIUM CHLORIDE 0.9 % IV BOLUS (SEPSIS)
1000.0000 mL | Freq: Once | INTRAVENOUS | Status: AC
  Administered 2016-10-10: 1000 mL via INTRAVENOUS

## 2016-10-10 MED ORDER — ONDANSETRON HCL 4 MG/2ML IJ SOLN
4.0000 mg | Freq: Once | INTRAMUSCULAR | Status: AC | PRN
  Administered 2016-10-10: 4 mg via INTRAVENOUS
  Filled 2016-10-10: qty 2

## 2016-10-10 MED ORDER — IBUPROFEN 800 MG PO TABS: 800.0000 mg | ORAL_TABLET | Freq: Three times a day (TID) | ORAL | 0 refills | Status: DC | PRN

## 2016-10-10 MED ORDER — IBUPROFEN 800 MG PO TABS
800.0000 mg | ORAL_TABLET | Freq: Once | ORAL | Status: AC
  Administered 2016-10-10: 800 mg via ORAL
  Filled 2016-10-10: qty 1

## 2016-10-10 MED ORDER — DIPHENOXYLATE-ATROPINE 2.5-0.025 MG PO TABS: 1.0000 | ORAL_TABLET | Freq: Four times a day (QID) | ORAL | 0 refills | Status: DC | PRN

## 2016-10-10 NOTE — ED Notes (Addendum)
Pt also c/o productive cough and nasal congestion. Pt reports she has bronchitis. Pt reports the nasal congestion is not a new symptom for her that she has this constantly, but it is worse than normal right now.

## 2016-10-10 NOTE — Care Management (Signed)
CM consult received for med assistance. Meds orders are either on $4 list at [facility] or are narcotic and not covered by [facility] voucher. Discussed with RN, White.

## 2016-10-10 NOTE — ED Notes (Signed)
ED Provider at bedside. 

## 2016-10-10 NOTE — ED Notes (Signed)
PA notified again of temperature of 103.3. PA to speak with MD.

## 2016-10-10 NOTE — ED Notes (Signed)
PA notified of temp of 101.4. Ibuprofen was given at 1154 prior to taking temperature. PA gave order to recheck temp again in 30 minutes before discharging pt.

## 2016-10-10 NOTE — Discharge Instructions (Signed)
Take the medicines for your symptoms,  rest and make sure you are drinking plenty of fluids.  Your liver enzymes are elevated and need to be rechecked within 1 week to make sure this is improving.  Avoid drinking alcohol and avoid any tylenol until this gets rechecked.

## 2016-10-10 NOTE — ED Provider Notes (Signed)
[place] DEPT Provider Note   CSN: [id number] Arrival date & time: 10/10/16  0818     History   Chief Complaint Chief Complaint  Patient presents with  . Emesis    HPI Becky [name] is a 55 y.o. female presenting with increased cough productive Of clear to white sputum production along with nasal congestion, along with post tussive emesis for the past 2 days but also endorses nausea which is also causing vomiting.  She has diarrhea as well.  She denies urinary symptoms.  She has seen no blood in her diarrhea, reporting to 3 episodes of loose but not watery stools.  She reports a history of chronic COPD, denies increased shortness of breath, chest pain.  She does endorse generalized body aches.  She has had no sick exposures, but works in [employer] and is around lots of people.  She has not had a flu shot this year.  Patient's history is significant for COPD, substance abuse including cocaine and EtOH.  She last drank one beer 2 days ago.    The history is provided by the patient.    Past Medical History:  Diagnosis Date  . Bronchitis   . Cocaine abuse   . Depression   . Headache   . Hypertension   . MDD (major depressive disorder)     Patient Active Problem List   Diagnosis Date Noted  . Chronic hepatitis C without hepatic coma ([place])   . Alcohol use disorder, severe, dependence ([place]) 01/20/2016  . Cocaine use disorder, severe, dependence ([place]) 01/20/2016  . Mild benzodiazepine use disorder 01/20/2016  . Chronic pain syndrome 01/20/2016  . Hepatitis C 01/20/2016  . Prolapsed internal hemorrhoids 06/08/2015  . Constipation 06/08/2015  . Major depressive disorder, recurrent, severe without psychotic features [facility])     Past Surgical History:  Procedure Laterality Date  . COLONOSCOPY WITH PROPOFOL N/A 06/23/2015   SLF: 1. Rectal bleeding/pain due to hemorrhoids2. one large and one small colorectal polyp removed.   . cysts removed from armpits bilaterally    .  POLYPECTOMY N/A 06/23/2015   Procedure: POLYPECTOMY;  Surgeon: Danie Binder, MD;  Location: [facility];  Service: Endoscopy;  Laterality: N/A;  descending colon    OB History    Gravida Para Term Preterm AB Living   5 3 2 1 2      SAB TAB Ectopic Multiple Live Births   2               Home Medications    Prior to Admission medications   Medication Sig Start Date End Date Taking? Authorizing Provider  amoxicillin (AMOXIL) 500 MG capsule Take 1 capsule (500 mg total) by mouth 3 (three) times daily. 03/10/16   Fransico Meadow, PA-C  cephALEXin (KEFLEX) 500 MG capsule Take 1 capsule (500 mg total) by mouth 4 (four) times daily. For 7 days 05/11/16   Tammy Triplett, PA-C  diclofenac (VOLTAREN) 50 MG EC tablet Take 1 tablet (50 mg total) by mouth 2 (two) times daily. 03/10/16   Fransico Meadow, PA-C  diphenoxylate-atropine (LOMOTIL) 2.5-0.025 MG tablet Take 1 tablet by mouth 4 (four) times daily as needed for diarrhea or loose stools. 10/10/16   Evalee Jefferson, PA-C  DULoxetine (CYMBALTA) 30 MG capsule Take 1 capsule (30 mg total) by mouth 2 (two) times daily. 01/25/16   Kerrie Buffalo, NP  ibuprofen (ADVIL,MOTRIN) 800 MG tablet Take 1 tablet (800 mg total) by mouth every 8 (eight) hours as needed for  fever. 10/10/16   Evalee Jefferson, PA-C  lisinopril (PRINIVIL,ZESTRIL) 10 MG tablet Take 1 tablet (10 mg total) by mouth daily. 01/25/16   Kerrie Buffalo, NP  phenazopyridine (PYRIDIUM) 200 MG tablet Take 1 tablet (200 mg total) by mouth 3 (three) times daily. 05/11/16   Tammy Triplett, PA-C  promethazine-codeine (PHENERGAN WITH CODEINE) 6.25-10 MG/5ML syrup Take 5 mLs by mouth every 4 (four) hours as needed for cough. 10/10/16   Evalee Jefferson, PA-C  traZODone (DESYREL) 50 MG tablet Take 1 tablet (50 mg total) by mouth at bedtime and may repeat dose one time if needed. 01/25/16   Kerrie Buffalo, NP    Family History Family History  Problem Relation Age of Onset  . Heart failure Mother   . Coronary artery disease Mother     . Heart failure Father   . Coronary artery disease Father   . Colon cancer Neg Hx   . Mental illness Neg Hx     Social History Social History  Substance Use Topics  . Smoking status: Current Some Day Smoker    Packs/day: 0.50    Types: Cigarettes  . Smokeless tobacco: Never Used     Comment: vague reports of smoking intermittently   . Alcohol use 0.6 oz/week    1 Cans of beer per week     Allergies   Patient has no known allergies.   Review of Systems Review of Systems  Constitutional: Positive for chills and fever.  HENT: Negative for congestion and sore throat.   Eyes: Negative.   Respiratory: Positive for cough. Negative for chest tightness and shortness of breath.   Cardiovascular: Negative for chest pain.  Gastrointestinal: Positive for diarrhea and vomiting. Negative for abdominal pain and nausea.  Genitourinary: Negative.   Musculoskeletal: Positive for myalgias. Negative for arthralgias, joint swelling and neck pain.  Skin: Negative.  Negative for rash and wound.  Neurological: Negative for dizziness, weakness, light-headedness, numbness and headaches.  Psychiatric/Behavioral: Negative.      Physical Exam Updated Vital Signs BP 121/79   Pulse 79   Temp 102.8 F (39.3 C) (Oral) Comment: PA notified  Resp 18   Ht 5\' 9"  (1.753 m)   Wt 80.3 kg   SpO2 96%   BMI 26.14 kg/m   Physical Exam  Constitutional: She appears well-developed and well-nourished.  HENT:  Head: Normocephalic and atraumatic.  Eyes: Conjunctivae are normal.  Neck: Normal range of motion.  Cardiovascular: Normal rate, regular rhythm, normal heart sounds and intact distal pulses.   Pulmonary/Chest: Effort normal and breath sounds normal. No respiratory distress. She has no wheezes.  Abdominal: Soft. Bowel sounds are normal. She exhibits no mass. There is no tenderness. There is no guarding.  Musculoskeletal: Normal range of motion.  Neurological: She is alert.  Skin: Skin is warm and  dry.  Psychiatric: She has a normal mood and affect.  Nursing note and vitals reviewed.    ED Treatments / Results  Labs   Results for orders placed or performed during the hospital encounter of 10/10/16  Lipase, blood  Result Value Ref Range   Lipase 22 11 - 51 U/L  Comprehensive metabolic panel  Result Value Ref Range   Sodium 138 135 - 145 mmol/L   Potassium 3.6 3.5 - 5.1 mmol/L   Chloride 110 101 - 111 mmol/L   CO2 20 (L) 22 - 32 mmol/L   Glucose, Bld 142 (H) 65 - 99 mg/dL   BUN 13 6 - 20 mg/dL  Creatinine, Ser 0.92 0.44 - 1.00 mg/dL   Calcium 9.0 8.9 - 10.3 mg/dL   Total Protein 7.1 6.5 - 8.1 g/dL   Albumin 3.7 3.5 - 5.0 g/dL   AST 113 (H) 15 - 41 U/L   ALT 113 (H) 14 - 54 U/L   Alkaline Phosphatase 114 38 - 126 U/L   Total Bilirubin 0.8 0.3 - 1.2 mg/dL   GFR calc non Af Amer >60 >60 mL/min   GFR calc Af Amer >60 >60 mL/min   Anion gap 8 5 - 15  CBC  Result Value Ref Range   WBC 3.3 (L) 4.0 - 10.5 K/uL   RBC 5.19 (H) 3.87 - 5.11 MIL/uL   Hemoglobin 14.9 12.0 - 15.0 g/dL   HCT 44.9 36.0 - 46.0 %   MCV 86.5 78.0 - 100.0 fL   MCH 28.7 26.0 - 34.0 pg   MCHC 33.2 30.0 - 36.0 g/dL   RDW 13.8 11.5 - 15.5 %   Platelets 129 (L) 150 - 400 K/uL   Dg Chest 2 View  Result Date: 10/10/2016 CLINICAL DATA:  Cough and wheezing. EXAM: CHEST  2 VIEW COMPARISON:  03/03/2016 FINDINGS: Normal heart size and mediastinal contours. No acute infiltrate or edema. No effusion or pneumothorax. No acute osseous findings. IMPRESSION: No active cardiopulmonary disease. Electronically Signed   By: Monte Fantasia M.D.   On: 10/10/2016 09:34    EKG  EKG Interpretation None          Procedures Procedures (including critical care time)  Medications Ordered in ED Medications  ondansetron (ZOFRAN) injection 4 mg (4 mg Intravenous Given 10/10/16 0847)  sodium chloride 0.9 % bolus 1,000 mL (0 mLs Intravenous Stopped 10/10/16 1005)  promethazine-codeine (PHENERGAN with CODEINE)  6.25-10 MG/5ML syrup 5 mL (5 mLs Oral Given 10/10/16 1023)  sodium chloride 0.9 % bolus 1,000 mL (0 mLs Intravenous Stopped 10/10/16 1203)  ibuprofen (ADVIL,MOTRIN) tablet 800 mg (800 mg Oral Given 10/10/16 1154)  diphenoxylate-atropine (LOMOTIL) 2.5-0.025 MG per tablet 2 tablet (2 tablets Oral Given 10/10/16 1154)     Initial Impression / Assessment and Plan / ED Course  I have reviewed the triage vital signs and the nursing notes.  Pertinent labs & imaging results that were available during my care of the patient were reviewed by me and considered in my medical decision making (see chart for details).  Clinical Course     Pt given IV fluids, zofran and was able to tolerate PO intake.  She spiked a fever during her visit, ibuprofen given.  Phenergan/codeine for cough.  Suspect viral syndrome, probably influenza.  Lfts elevated today which was discussed with pt and need for recheck of these labs within one week.  Recheck prior to dc - no abd pain including ruq pain.  Advised rest, motrin for fever reduction, increased fluid intake.    Discussed with Dr. Lacinda Axon prior to dc home.  The patient appears reasonably screened and/or stabilized for discharge and I doubt any other medical condition or other [facility] requiring further screening, evaluation, or treatment in the ED at this time prior to discharge.   Final Clinical Impressions(s) / ED Diagnoses   Final diagnoses:  Nausea vomiting and diarrhea  Cough  Viral syndrome    New Prescriptions Discharge Medication List as of 10/10/2016 11:54 AM    START taking these medications   Details  diphenoxylate-atropine (LOMOTIL) 2.5-0.025 MG tablet Take 1 tablet by mouth 4 (four) times daily as needed for diarrhea or loose  stools., Starting Mon 10/10/2016, Print    ibuprofen (ADVIL,MOTRIN) 800 MG tablet Take 1 tablet (800 mg total) by mouth every 8 (eight) hours as needed for fever., Starting Mon 10/10/2016, Print    promethazine-codeine (PHENERGAN  WITH CODEINE) 6.25-10 MG/5ML syrup Take 5 mLs by mouth every 4 (four) hours as needed for cough., Starting Mon 10/10/2016, Print         Evalee Jefferson, PA-C 10/12/16 [id number]    [name], MD 10/12/16 [phone]

## 2016-10-10 NOTE — ED Triage Notes (Signed)
Pt vomiting in triage, states N/V/D  X 2 days

## 2017-04-08 ENCOUNTER — Encounter (HOSPITAL_COMMUNITY): Payer: Self-pay | Admitting: Emergency Medicine

## 2017-04-08 ENCOUNTER — Emergency Department (HOSPITAL_COMMUNITY)
Admission: EM | Admit: 2017-04-08 | Discharge: 2017-04-09 | Disposition: A | Discharge status: Other Acute Inpatient Hospital | Payer: Medicaid Other | Attending: Emergency Medicine | Admitting: Emergency Medicine

## 2017-04-08 DIAGNOSIS — I10 Essential (primary) hypertension: Secondary | ICD-10-CM | POA: Insufficient documentation

## 2017-04-08 DIAGNOSIS — F329 Major depressive disorder, single episode, unspecified: Secondary | ICD-10-CM | POA: Insufficient documentation

## 2017-04-08 DIAGNOSIS — F1910 Other psychoactive substance abuse, uncomplicated: Secondary | ICD-10-CM | POA: Insufficient documentation

## 2017-04-08 DIAGNOSIS — F32A Depression, unspecified: Secondary | ICD-10-CM

## 2017-04-08 DIAGNOSIS — F17210 Nicotine dependence, cigarettes, uncomplicated: Secondary | ICD-10-CM | POA: Insufficient documentation

## 2017-04-08 DIAGNOSIS — R45851 Suicidal ideations: Secondary | ICD-10-CM

## 2017-04-08 DIAGNOSIS — Z79899 Other long term (current) drug therapy: Secondary | ICD-10-CM | POA: Insufficient documentation

## 2017-04-08 LAB — HEPATIC FUNCTION PANEL
ALK PHOS: 60 U/L (ref 38–126)
ALT: 27 U/L (ref 14–54)
AST: 22 U/L (ref 15–41)
Albumin: 4 g/dL (ref 3.5–5.0)
BILIRUBIN INDIRECT: 0.5 mg/dL (ref 0.3–0.9)
Bilirubin, Direct: 0.1 mg/dL (ref 0.1–0.5)
TOTAL PROTEIN: 7.3 g/dL (ref 6.5–8.1)
Total Bilirubin: 0.6 mg/dL (ref 0.3–1.2)

## 2017-04-08 LAB — RAPID URINE DRUG SCREEN, HOSP PERFORMED
Amphetamines: NOT DETECTED
BENZODIAZEPINES: NOT DETECTED
Barbiturates: NOT DETECTED
Cocaine: POSITIVE — AB
Opiates: NOT DETECTED
Tetrahydrocannabinol: POSITIVE — AB

## 2017-04-08 LAB — CBC WITH DIFFERENTIAL/PLATELET
BASOS ABS: 0.1 10*3/uL (ref 0.0–0.1)
Basophils Relative: 1 %
EOS ABS: 0.1 10*3/uL (ref 0.0–0.7)
EOS PCT: 2 %
HCT: 45.9 % (ref 36.0–46.0)
Hemoglobin: 15.3 g/dL — ABNORMAL HIGH (ref 12.0–15.0)
Lymphocytes Relative: 54 %
Lymphs Abs: 3.2 10*3/uL (ref 0.7–4.0)
MCH: 28.8 pg (ref 26.0–34.0)
MCHC: 33.3 g/dL (ref 30.0–36.0)
MCV: 86.4 fL (ref 78.0–100.0)
Monocytes Absolute: 0.3 10*3/uL (ref 0.1–1.0)
Monocytes Relative: 5 %
Neutro Abs: 2.2 10*3/uL (ref 1.7–7.7)
Neutrophils Relative %: 38 %
PLATELETS: 210 10*3/uL (ref 150–400)
RBC: 5.31 MIL/uL — ABNORMAL HIGH (ref 3.87–5.11)
RDW: 14.5 % (ref 11.5–15.5)
WBC: 5.9 10*3/uL (ref 4.0–10.5)

## 2017-04-08 LAB — BASIC METABOLIC PANEL
ANION GAP: 8 (ref 5–15)
BUN: 19 mg/dL (ref 6–20)
CO2: 23 mmol/L (ref 22–32)
Calcium: 9.4 mg/dL (ref 8.9–10.3)
Chloride: 108 mmol/L (ref 101–111)
Creatinine, Ser: 0.95 mg/dL (ref 0.44–1.00)
Glucose, Bld: 125 mg/dL — ABNORMAL HIGH (ref 65–99)
POTASSIUM: 4.1 mmol/L (ref 3.5–5.1)
SODIUM: 139 mmol/L (ref 135–145)

## 2017-04-08 LAB — ETHANOL

## 2017-04-08 MED ORDER — LISINOPRIL 10 MG PO TABS
10.0000 mg | ORAL_TABLET | Freq: Every day | ORAL | Status: DC
  Administered 2017-04-08: 10 mg via ORAL
  Filled 2017-04-08: qty 1

## 2017-04-08 MED ORDER — IBUPROFEN 400 MG PO TABS
600.0000 mg | ORAL_TABLET | Freq: Once | ORAL | Status: AC
  Administered 2017-04-08: 600 mg via ORAL
  Filled 2017-04-08: qty 2

## 2017-04-08 MED ORDER — ONDANSETRON HCL 4 MG PO TABS: 4.0000 mg | ORAL_TABLET | Freq: Three times a day (TID) | ORAL | Status: DC | PRN

## 2017-04-08 MED ORDER — ACETAMINOPHEN 325 MG PO TABS: 650.0000 mg | ORAL_TABLET | ORAL | Status: DC | PRN

## 2017-04-08 MED ORDER — ALUM & MAG HYDROXIDE-SIMETH 200-200-20 MG/5ML PO SUSP
30.0000 mL | ORAL | Status: DC | PRN
Start: 2017-04-08 — End: 2017-04-09

## 2017-04-08 MED ORDER — IBUPROFEN 400 MG PO TABS: 600.0000 mg | ORAL_TABLET | Freq: Three times a day (TID) | ORAL | Status: DC | PRN

## 2017-04-08 MED ORDER — LORAZEPAM 1 MG PO TABS: 1.0000 mg | ORAL_TABLET | Freq: Three times a day (TID) | ORAL | Status: DC | PRN

## 2017-04-08 NOTE — ED Notes (Signed)
Pt instructed on getting a urine sample

## 2017-04-08 NOTE — BH Assessment (Signed)
Patient accepted to [facility], bed 300-1. Attending MD [place], please call report to [phone].

## 2017-04-08 NOTE — ED Notes (Signed)
ED Provider at bedside. 

## 2017-04-08 NOTE — ED Notes (Signed)
Security wanded pt ?

## 2017-04-08 NOTE — ED Notes (Signed)
Lab in room to obtain blood work

## 2017-04-08 NOTE — Progress Notes (Signed)
Patient was referred to inpatient treatment at the following facilities: [facility], [facility], [place], [place], [place], [place].  CSW in disposition will continue to seek placement.  Verlon Setting, [place] Disposition staff 04/08/2017 4:34 PM

## 2017-04-08 NOTE — ED Triage Notes (Signed)
Here for assessment of depression- Pt reports that she has no where to live Here with [facility]

## 2017-04-08 NOTE — ED Triage Notes (Signed)
Patient states "life is too hard, I don't have any of my medicines, I don't have anywhere to live, and I don't want to live."

## 2017-04-08 NOTE — ED Notes (Signed)
Pt placed in paper scrubs, belongings locked in locker within the dept., security called to wand pt.

## 2017-04-08 NOTE — ED Provider Notes (Signed)
[facility] DEPT Provider Note   CSN: [id number] Arrival date & time: 04/08/17  1114   By signing my name below, I, Hilbert Odor, attest that this documentation has been prepared under the direction and in the presence of Davonna Belling, MD. Electronically Signed: Hilbert Odor, Scribe. 04/08/17. 1:18 PM. History   Chief Complaint Chief Complaint  Patient presents with  . V70.1    The history is provided by the patient. No language interpreter was used.  HPI Comments: Becky [name] is a 56 y.o. female who presents to the Emergency Department complaining of feeling depressed since earlier today. She states that she has no where to live. She states that she is not currentlly on any of her depression medications because she can't afford to buy them. She reports current cocaine, alcohol, and marijuana use. Her last use of cocaine was 2 days. She has hx of suicidal ideas. She states that she feels as though she might hurt herself at this point. She denies hallucinations or homicidal ideas.  Past Medical History:  Diagnosis Date  . Bronchitis   . Cocaine abuse   . Depression   . Headache   . Hypertension   . MDD (major depressive disorder)     Patient Active Problem List   Diagnosis Date Noted  . Chronic hepatitis C without hepatic coma ([place])   . Alcohol use disorder, severe, dependence ([place]) 01/20/2016  . Cocaine use disorder, severe, dependence ([place]) 01/20/2016  . Mild benzodiazepine use disorder 01/20/2016  . Chronic pain syndrome 01/20/2016  . Hepatitis C 01/20/2016  . Prolapsed internal hemorrhoids 06/08/2015  . Constipation 06/08/2015  . Major depressive disorder, recurrent, severe without psychotic features [facility])     Past Surgical History:  Procedure Laterality Date  . COLONOSCOPY WITH PROPOFOL N/A 06/23/2015   SLF: 1. Rectal bleeding/pain due to hemorrhoids2. one large and one small colorectal polyp removed.   . cysts removed from armpits bilaterally    .  POLYPECTOMY N/A 06/23/2015   Procedure: POLYPECTOMY;  Surgeon: Danie Binder, MD;  Location: [facility];  Service: Endoscopy;  Laterality: N/A;  descending colon    OB History    Gravida Para Term Preterm AB Living   5 3 2 1 2      SAB TAB Ectopic Multiple Live Births   2               Home Medications    Prior to Admission medications   Medication Sig Start Date End Date Taking? Authorizing Provider  diphenhydramine-acetaminophen (TYLENOL PM) 25-500 MG TABS tablet Take 3 tablets by mouth at bedtime.   Yes [provider]  ibuprofen (ADVIL,MOTRIN) 200 MG tablet Take 1,000 mg by mouth every 6 (six) hours as needed for headache or moderate pain.   Yes [provider]  oxymetazoline (AFRIN) 0.05 % nasal spray Place 1 spray into both nostrils 2 (two) times daily as needed for congestion.   Yes [provider]  diclofenac (VOLTAREN) 50 MG EC tablet Take 1 tablet (50 mg total) by mouth 2 (two) times daily. Patient not taking: Reported on 04/08/2017 03/10/16   Fransico Meadow, PA-C  DULoxetine (CYMBALTA) 30 MG capsule Take 1 capsule (30 mg total) by mouth 2 (two) times daily. Patient not taking: Reported on 04/08/2017 01/25/16   Kerrie Buffalo, NP  lisinopril (PRINIVIL,ZESTRIL) 10 MG tablet Take 1 tablet (10 mg total) by mouth daily. Patient not taking: Reported on 04/08/2017 01/25/16   Kerrie Buffalo, NP  phenazopyridine (PYRIDIUM) 200  MG tablet Take 1 tablet (200 mg total) by mouth 3 (three) times daily. Patient not taking: Reported on 04/08/2017 05/11/16   Kem Parkinson, PA-C    Family History Family History  Problem Relation Age of Onset  . Heart failure Mother   . Coronary artery disease Mother   . Heart failure Father   . Coronary artery disease Father   . Colon cancer Neg Hx   . Mental illness Neg Hx     Social History Social History  Substance Use Topics  . Smoking status: Current Some Day Smoker    Packs/day: 0.50    Types: Cigarettes  . Smokeless  tobacco: Never Used     Comment: vague reports of smoking intermittently   . Alcohol use 0.6 oz/week    1 Cans of beer per week     Allergies   Patient has no known allergies.   Review of Systems Review of Systems  Constitutional: Negative for fever.  Respiratory: Negative for shortness of breath.   Gastrointestinal: Negative for abdominal pain.  Skin: Negative for rash.  Neurological: Negative for headaches.  Psychiatric/Behavioral: Positive for suicidal ideas. Negative for hallucinations.  All other systems reviewed and are negative.  Physical Exam Updated Vital Signs BP (!) 140/92 (BP Location: Left Arm)   Pulse (!) 56   Temp 98.1 F (36.7 C) (Oral)   Resp 16   Ht 5\' 9"  (1.753 m)   Wt 172 lb (78 kg)   SpO2 99%   BMI 25.40 kg/m   Physical Exam  Constitutional: [name], place, and time. She appears well-developed and well-nourished.  HENT:  Head: Normocephalic.  Nasal congestion.  Eyes: EOM are normal.  Neck: Normal range of motion.  Pulmonary/Chest: Effort normal.  Abdominal: She exhibits no distension. There is no tenderness.  Abdomen is non-tender.  Musculoskeletal: Normal range of motion.  Neurological: She is alert and oriented to person, place, and time.  Psychiatric: She has a normal mood and affect.  Depressed. Tearful.  Nursing note and vitals reviewed.  ED Treatments / Results  DIAGNOSTIC STUDIES: Oxygen Saturation is 99% on RA, normal by my interpretation.    COORDINATION OF CARE: 12:20 PM Discussed treatment plan with pt at bedside and pt agreed to plan. I will consult psychiatry for the patient.  Labs (all labs ordered are listed, but only abnormal results are displayed) Labs Reviewed  CBC WITH DIFFERENTIAL/PLATELET - Abnormal; Notable for the following:       Result Value   RBC 5.31 (*)    Hemoglobin 15.3 (*)    All other components within normal limits  BASIC METABOLIC PANEL - Abnormal; Notable for the following:     Glucose, Bld 125 (*)    All other components within normal limits  RAPID URINE DRUG SCREEN, HOSP PERFORMED - Abnormal; Notable for the following:    Cocaine POSITIVE (*)    Tetrahydrocannabinol POSITIVE (*)    All other components within normal limits  ETHANOL  HEPATIC FUNCTION PANEL    EKG  EKG Interpretation None       Radiology No results found.  Procedures Procedures (including critical care time)  Medications Ordered in ED Medications  lisinopril (PRINIVIL,ZESTRIL) tablet 10 mg (not administered)  LORazepam (ATIVAN) tablet 1 mg (not administered)  acetaminophen (TYLENOL) tablet 650 mg (not administered)  ibuprofen (ADVIL,MOTRIN) tablet 600 mg (not administered)  ondansetron (ZOFRAN) tablet 4 mg (not administered)  alum & mag hydroxide-simeth (MAALOX/MYLANTA) 200-200-20 MG/5ML suspension 30 mL (not  administered)  ibuprofen (ADVIL,MOTRIN) tablet 600 mg (600 mg Oral Given 04/08/17 1256)     Initial Impression / Assessment and Plan / ED Course  I have reviewed the triage vital signs and the nursing notes.  Pertinent labs & imaging results that were available during my care of the patient were reviewed by me and considered in my medical decision making (see chart for details).     Patient with depression and suicidal thoughts. May be homeless. History of depression. Medically cleared. To be seen by TTS.  Final Clinical Impressions(s) / ED Diagnoses   Final diagnoses:  Suicidal ideations  Depression, unspecified depression type  Polysubstance abuse    New Prescriptions New Prescriptions   No medications on file   I personally performed the services described in this documentation, which was scribed in my presence. The recorded information has been reviewed and is accurate.      Davonna Belling, MD 04/08/17 1710

## 2017-04-08 NOTE — BH Assessment (Addendum)
Tele Assessment Note   Becky [name] is 56 year old.  Patient reports that she has given up on life and she is tired.  Patient reports that she just wants to, "lay down and be with the Lord".  Patient reports that she is not able to contract for safety.  Patient reports hearing voices telling her to kill herself.  Patient reports that she is non-compliant with taking her psychiatric medication.  Patient reports prior inpatient psychiatric hospitalization at [place] [facility] but she is not able to remember the year that she was there.   Patient reports being addicted to alcohol and she does not use any drugs; however, patient BAL is <5 and her UDS is positive for cocaine and marijuana.  Patient refused to answer any questions regarding her use of cocaine.    Diagnosis: Major Depressive Disorder; Cocaine Abuse; Schizoaffective Disorder  Past Medical History:  Past Medical History:  Diagnosis Date  . Bronchitis   . Cocaine abuse   . Depression   . Headache   . Hypertension   . MDD (major depressive disorder)     Past Surgical History:  Procedure Laterality Date  . COLONOSCOPY WITH PROPOFOL N/A 06/23/2015   SLF: 1. Rectal bleeding/pain due to hemorrhoids2. one large and one small colorectal polyp removed.   . cysts removed from armpits bilaterally    . POLYPECTOMY N/A 06/23/2015   Procedure: POLYPECTOMY;  Surgeon: Danie Binder, MD;  Location: [facility];  Service: Endoscopy;  Laterality: N/A;  descending colon    Family History:  Family History  Problem Relation Age of Onset  . Heart failure Mother   . Coronary artery disease Mother   . Heart failure Father   . Coronary artery disease Father   . Colon cancer Neg Hx   . Mental illness Neg Hx     Social History:  reports that she has been smoking Cigarettes.  She has been smoking about 0.50 packs per day. She has never used smokeless tobacco. She reports that she drinks about 0.6 oz of alcohol per week . She reports that she uses drugs,  including Benzodiazepines, Cocaine, and Marijuana.  Additional Social History:  Alcohol / Drug Use History of alcohol / drug use?: Yes Longest period of sobriety (when/how long): Patient deines sbriety. Negative Consequences of Use: Financial, Personal relationships, Work / School Withdrawal Symptoms:  (None Reported) Substance #1 Name of Substance 1: Cocaine  1 - Age of First Use: Refused to answer the question  1 - Amount (size/oz): Refused to answer the question  1 - Frequency: Refused to answer the question  1 - Duration: Refused to answer the question  1 - Last Use / Amount: Refused to answer the question.  UDS is positive for cocaine and marijuanna.  CIWA: CIWA-Ar BP: (!) 140/92 Pulse Rate: (!) 56 COWS:    PATIENT STRENGTHS: (choose at least two) Average or above average intelligence Capable of independent living Communication skills Physical Health  Allergies: No Known Allergies  Home Medications:  (Not in a hospital admission)  OB/GYN Status:  No LMP recorded. Patient is postmenopausal.  General Assessment Data Location of Assessment: [facility] ED TTS Assessment: In system Is this a Tele or Face-to-Face Assessment?: Tele Assessment Is this an Initial Assessment or a Re-assessment for this encounter?: Initial Assessment Marital status: Single Maiden name: NA Is patient pregnant?: No Pregnancy Status: No Living Arrangements: Alone Can pt return to current living arrangement?: Yes Admission Status: Voluntary Is patient capable of signing voluntary admission?:  Yes Referral Source: Self/Family/Friend Insurance type: [employer]  Medical Screening Exam ([place]) Medical Exam completed:  (NA)  Crisis Care Plan Living Arrangements: Alone Legal Guardian:  (NA) Name of Psychiatrist: [facility]  Name of Therapist: [facility]  Education Status Is patient currently in school?: No Current Grade: NA Highest grade of school patient has completed: NA Name of school:  NA Contact person: NA  Risk to self with the past 6 months Suicidal Ideation: Yes-Currently Present Has patient been a risk to self within the past 6 months prior to admission? : Yes Suicidal Intent: Yes-Currently Present Has patient had any suicidal intent within the past 6 months prior to admission? : Yes Is patient at risk for suicide?: Yes Suicidal Plan?: No Has patient had any suicidal plan within the past 6 months prior to admission? : Yes Access to Means: No What has been your use of drugs/alcohol within the last 12 months?: Cocaine Previous Attempts/Gestures: Yes How many times?: 1 Other Self Harm Risks: NA Triggers for Past Attempts: Unpredictable Intentional Self Injurious Behavior: None Family Suicide History: No Recent stressful life event(s): Other (Comment) (Voices telling her to kill herself.) Persecutory voices/beliefs?: Yes Depression: Yes Depression Symptoms: Despondent, Insomnia, Tearfulness, Isolating, Fatigue, Guilt, Loss of interest in usual pleasures, Feeling worthless/self pity, Feeling angry/irritable Substance abuse history and/or treatment for substance abuse?: Yes (Cocaine ) Suicide prevention information given to non-admitted patients: Yes  Risk to Others within the past 6 months Homicidal Ideation: No Does patient have any lifetime risk of violence toward others beyond the six months prior to admission? : No Thoughts of Harm to Others: No Current Homicidal Intent: No Current Homicidal Plan: No Access to Homicidal Means: No Identified Victim: NA History of harm to others?: No Assessment of Violence: None Noted Violent Behavior Description: NA Does patient have access to weapons?: No Criminal Charges Pending?: No Does patient have a court date: No Is patient on probation?: No  Psychosis Hallucinations: Auditory Delusions: None noted  Mental Status Report Appearance/Hygiene: Disheveled Eye Contact: Fair Motor Activity: Freedom of  movement Speech: Logical/coherent Level of Consciousness: Alert Mood: Depressed, Anxious Affect: Anxious Anxiety Level: Minimal Thought Processes: Coherent, Relevant Judgement: Impaired Orientation: Person, Place, Time, Situation Obsessive Compulsive Thoughts/Behaviors: None  Cognitive Functioning Concentration: Decreased Memory: Recent Intact, Remote Intact IQ: Average Insight: Poor Impulse Control: Poor Appetite: Fair Weight Loss: 0 Weight Gain: 0 Sleep: Decreased Total Hours of Sleep: 4 Vegetative Symptoms: Staying in bed  ADLScreening [facility] Assessment Services) Patient's cognitive ability adequate to safely complete daily activities?: No Patient able to express need for assistance with ADLs?: Yes Independently performs ADLs?: Yes (appropriate for developmental age)  Prior Inpatient Therapy Prior Inpatient Therapy: Yes Prior Therapy Dates: Unable to remember  Prior Therapy Facilty/Provider(s): [facility]  Reason for Treatment: SI  Prior Outpatient Therapy Prior Outpatient Therapy: Yes Prior Therapy Dates: Ongoing  Prior Therapy Facilty/Provider(s): [facility]  Reason for Treatment: Med Mgt Does patient have an ACCT team?: No Does patient have Intensive In-House Services?  : No Does patient have [facility] services? : No Does patient have P4CC services?: No  ADL Screening (condition at time of admission) Patient's cognitive ability adequate to safely complete daily activities?: No Is the patient deaf or have difficulty hearing?: No Does the patient have difficulty seeing, even when wearing glasses/contacts?: No Does the patient have difficulty concentrating, remembering, or making decisions?: No Patient able to express need for assistance with ADLs?: Yes Does the patient have difficulty dressing or bathing?: No Independently performs ADLs?:  Yes (appropriate for developmental age) Does the patient have difficulty walking or climbing stairs?: No Weakness of Legs:  None Weakness of Arms/Hands: None  Home Assistive Devices/Equipment Home Assistive Devices/Equipment: None    Abuse/Neglect Assessment (Assessment to be complete while patient is alone) Physical Abuse: Denies Verbal Abuse: Denies Sexual Abuse: Denies Exploitation of patient/patient's resources: Denies Self-Neglect: Denies Values / Beliefs Cultural Requests During Hospitalization: None Spiritual Requests During Hospitalization: None Consults Spiritual Care Consult Needed: No Social Work Consult Needed: No Regulatory affairs officer (For Healthcare) Does Patient Have a Medical Advance Directive?: No Would patient like information on creating a medical advance directive?: No - Patient declined    Additional Information 1:1 In Past 12 Months?: No CIRT Risk: No Elopement Risk: No Does patient have medical clearance?: Yes     Disposition: Per Otila Kluver, NP - patient meets criteria for inpatient hospitalization.  CSW will seek placement.  Disposition Initial Assessment Completed for this Encounter: Yes Disposition of Patient: Inpatient treatment program Type of inpatient treatment program: Adult  Rene Paci 04/08/2017 2:30 PM

## 2017-04-09 ENCOUNTER — Inpatient Hospital Stay (HOSPITAL_COMMUNITY)
Admission: AD | Admit: 2017-04-09 | Discharge: 2017-04-14 | DRG: 885 | Disposition: A | Discharge status: Home / Self Care | Payer: No Typology Code available for payment source | Source: Intra-hospital | Attending: Psychiatry | Admitting: Psychiatry

## 2017-04-09 ENCOUNTER — Encounter (HOSPITAL_COMMUNITY): Payer: Self-pay | Admitting: *Deleted

## 2017-04-09 DIAGNOSIS — R400 Somnolence: Secondary | ICD-10-CM | POA: Diagnosis present

## 2017-04-09 DIAGNOSIS — F1410 Cocaine abuse, uncomplicated: Secondary | ICD-10-CM | POA: Diagnosis present

## 2017-04-09 DIAGNOSIS — F17210 Nicotine dependence, cigarettes, uncomplicated: Secondary | ICD-10-CM | POA: Diagnosis present

## 2017-04-09 DIAGNOSIS — Z79899 Other long term (current) drug therapy: Secondary | ICD-10-CM | POA: Diagnosis not present

## 2017-04-09 DIAGNOSIS — Z9119 Patient's noncompliance with other medical treatment and regimen: Secondary | ICD-10-CM

## 2017-04-09 DIAGNOSIS — R45851 Suicidal ideations: Secondary | ICD-10-CM | POA: Diagnosis not present

## 2017-04-09 DIAGNOSIS — R440 Auditory hallucinations: Secondary | ICD-10-CM | POA: Diagnosis present

## 2017-04-09 DIAGNOSIS — B182 Chronic viral hepatitis C: Secondary | ICD-10-CM | POA: Diagnosis present

## 2017-04-09 DIAGNOSIS — F1290 Cannabis use, unspecified, uncomplicated: Secondary | ICD-10-CM | POA: Diagnosis not present

## 2017-04-09 DIAGNOSIS — F1390 Sedative, hypnotic, or anxiolytic use, unspecified, uncomplicated: Secondary | ICD-10-CM | POA: Diagnosis not present

## 2017-04-09 DIAGNOSIS — Z590 Homelessness: Secondary | ICD-10-CM | POA: Diagnosis not present

## 2017-04-09 DIAGNOSIS — Z86010 Personal history of colonic polyps: Secondary | ICD-10-CM

## 2017-04-09 DIAGNOSIS — I10 Essential (primary) hypertension: Secondary | ICD-10-CM | POA: Diagnosis present

## 2017-04-09 DIAGNOSIS — F332 Major depressive disorder, recurrent severe without psychotic features: Secondary | ICD-10-CM | POA: Diagnosis present

## 2017-04-09 DIAGNOSIS — F1490 Cocaine use, unspecified, uncomplicated: Secondary | ICD-10-CM | POA: Diagnosis not present

## 2017-04-09 LAB — LIPID PANEL
Cholesterol: 228 mg/dL — ABNORMAL HIGH (ref 0–200)
HDL: 58 mg/dL
LDL Cholesterol: 132 mg/dL — ABNORMAL HIGH (ref 0–99)
Total CHOL/HDL Ratio: 3.9 ratio
Triglycerides: 191 mg/dL — ABNORMAL HIGH
VLDL: 38 mg/dL (ref 0–40)

## 2017-04-09 LAB — TSH: TSH: 4.147 u[IU]/mL (ref 0.350–4.500)

## 2017-04-09 MED ORDER — ARIPIPRAZOLE 2 MG PO TABS
2.0000 mg | ORAL_TABLET | Freq: Every day | ORAL | Status: DC
  Administered 2017-04-09: 2 mg via ORAL
  Filled 2017-04-09 (×3): qty 1

## 2017-04-09 MED ORDER — SERTRALINE HCL 25 MG PO TABS
25.0000 mg | ORAL_TABLET | Freq: Every day | ORAL | Status: DC
  Administered 2017-04-09: 25 mg via ORAL
  Filled 2017-04-09 (×3): qty 1

## 2017-04-09 MED ORDER — ARIPIPRAZOLE 5 MG PO TABS
5.0000 mg | ORAL_TABLET | Freq: Every day | ORAL | Status: DC
  Administered 2017-04-10 – 2017-04-14 (×5): 5 mg via ORAL
  Filled 2017-04-09: qty 7
  Filled 2017-04-09 (×7): qty 1

## 2017-04-09 MED ORDER — VITAMIN B-1 100 MG PO TABS
100.0000 mg | ORAL_TABLET | Freq: Every day | ORAL | Status: DC
  Administered 2017-04-10 – 2017-04-14 (×5): 100 mg via ORAL
  Filled 2017-04-09 (×7): qty 1

## 2017-04-09 MED ORDER — NICOTINE 21 MG/24HR TD PT24
21.0000 mg | MEDICATED_PATCH | Freq: Every day | TRANSDERMAL | Status: DC
  Administered 2017-04-09 – 2017-04-14 (×6): 21 mg via TRANSDERMAL
  Filled 2017-04-09 (×8): qty 1

## 2017-04-09 MED ORDER — MAGNESIUM HYDROXIDE 400 MG/5ML PO SUSP
30.0000 mL | Freq: Every day | ORAL | Status: DC | PRN
  Administered 2017-04-09 – 2017-04-13 (×3): 30 mL via ORAL
  Filled 2017-04-09 (×3): qty 30

## 2017-04-09 MED ORDER — ACETAMINOPHEN 325 MG PO TABS
650.0000 mg | ORAL_TABLET | Freq: Four times a day (QID) | ORAL | Status: DC | PRN
  Administered 2017-04-10 – 2017-04-12 (×2): 650 mg via ORAL
  Filled 2017-04-09 (×2): qty 2

## 2017-04-09 MED ORDER — HYDROXYZINE HCL 25 MG PO TABS: 25.0000 mg | ORAL_TABLET | Freq: Four times a day (QID) | ORAL | Status: AC | PRN

## 2017-04-09 MED ORDER — ENSURE ENLIVE PO LIQD: 237.0000 mL | Freq: Two times a day (BID) | ORAL | Status: DC

## 2017-04-09 MED ORDER — LORAZEPAM 1 MG PO TABS: 1.0000 mg | ORAL_TABLET | Freq: Four times a day (QID) | ORAL | Status: DC | PRN

## 2017-04-09 MED ORDER — LORAZEPAM 1 MG PO TABS
1.0000 mg | ORAL_TABLET | Freq: Three times a day (TID) | ORAL | Status: DC
  Administered 2017-04-10 (×2): 1 mg via ORAL
  Filled 2017-04-09 (×2): qty 1

## 2017-04-09 MED ORDER — SERTRALINE HCL 50 MG PO TABS
50.0000 mg | ORAL_TABLET | Freq: Every day | ORAL | Status: DC
  Administered 2017-04-10 – 2017-04-12 (×3): 50 mg via ORAL
  Filled 2017-04-09 (×4): qty 1

## 2017-04-09 MED ORDER — PANTOPRAZOLE SODIUM 20 MG PO TBEC
20.0000 mg | DELAYED_RELEASE_TABLET | Freq: Every day | ORAL | Status: DC
  Administered 2017-04-09 – 2017-04-14 (×6): 20 mg via ORAL
  Filled 2017-04-09 (×2): qty 1
  Filled 2017-04-09: qty 7
  Filled 2017-04-09 (×6): qty 1

## 2017-04-09 MED ORDER — LORAZEPAM 1 MG PO TABS
1.0000 mg | ORAL_TABLET | Freq: Four times a day (QID) | ORAL | Status: AC
  Administered 2017-04-09 (×4): 1 mg via ORAL
  Filled 2017-04-09 (×4): qty 1

## 2017-04-09 MED ORDER — LOPERAMIDE HCL 2 MG PO CAPS: 2.0000 mg | ORAL_CAPSULE | ORAL | Status: AC | PRN

## 2017-04-09 MED ORDER — IBUPROFEN 600 MG PO TABS
600.0000 mg | ORAL_TABLET | Freq: Four times a day (QID) | ORAL | Status: DC | PRN
  Administered 2017-04-12 – 2017-04-13 (×4): 600 mg via ORAL
  Filled 2017-04-09 (×4): qty 1

## 2017-04-09 MED ORDER — ADULT MULTIVITAMIN W/MINERALS CH
1.0000 | ORAL_TABLET | Freq: Every day | ORAL | Status: DC
  Administered 2017-04-09 – 2017-04-14 (×6): 1 via ORAL
  Filled 2017-04-09 (×8): qty 1

## 2017-04-09 MED ORDER — ALUM & MAG HYDROXIDE-SIMETH 200-200-20 MG/5ML PO SUSP
30.0000 mL | ORAL | Status: DC | PRN
  Administered 2017-04-09 – 2017-04-12 (×3): 30 mL via ORAL
  Filled 2017-04-09 (×3): qty 30

## 2017-04-09 MED ORDER — LORAZEPAM 1 MG PO TABS: 1.0000 mg | ORAL_TABLET | Freq: Two times a day (BID) | ORAL | Status: DC

## 2017-04-09 MED ORDER — ONDANSETRON 4 MG PO TBDP: 4.0000 mg | ORAL_TABLET | Freq: Four times a day (QID) | ORAL | Status: AC | PRN

## 2017-04-09 MED ORDER — OXYMETAZOLINE HCL 0.05 % NA SOLN: 1.0000 | Freq: Two times a day (BID) | NASAL | Status: DC | PRN

## 2017-04-09 MED ORDER — LORAZEPAM 1 MG PO TABS: 1.0000 mg | ORAL_TABLET | Freq: Every day | ORAL | Status: DC

## 2017-04-09 NOTE — BHH Suicide Risk Assessment (Signed)
[facility] Admission Suicide Risk Assessment   Nursing information obtained from:   patient and chart  Demographic factors:   56 year old single female, has three children ( who are staying with patient's sister )  homeless, currently unemployed Current Mental Status:   see below Loss Factors:   homelessness, depression Historical Factors:    Risk Reduction Factors:   resilience, sense of responsibility to family   Total Time spent with patient: 45 minutes Principal Problem: Severe recurrent major depression without psychotic features ([place]) Diagnosis:   Patient Active Problem List   Diagnosis Date Noted  . Severe recurrent major depression without psychotic features ([place]) [F33.2] 04/09/2017  . Chronic hepatitis C without hepatic coma ([place]) [B18.2]   . Alcohol use disorder, severe, dependence ([place]) [F10.20] 01/20/2016  . Cocaine use disorder, severe, dependence ([place]) [F14.20] 01/20/2016  . 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 Chronic pain syndrome [G89.4] 01/20/2016  . Hepatitis C [B19.20] 01/20/2016  . Prolapsed internal hemorrhoids [K64.8] 06/08/2015  . Constipation [K59.00] 06/08/2015  . Major depressive disorder, recurrent, severe without psychotic features ([place]) [F33.2]     Continued Clinical Symptoms:  Alcohol Use Disorder Identification Test Final Score (AUDIT): 24 The "Alcohol Use Disorders Identification Test", Guidelines for Use in Primary Care, Second Edition.  World Pharmacologist [facility]). Score between 0-7:  no or low risk or alcohol related problems. Score between 8-15:  moderate risk of alcohol related problems. Score between 16-19:  high risk of alcohol related problems. Score 20 or above:  warrants further diagnostic evaluation for alcohol dependence and treatment.   CLINICAL FACTORS:  56 year old female, presented to ED voluntarily, due to worsening depression, suicidal ideations, with thoughts of overdosing. She states she has also been  experiencing auditory hallucinations telling her to kill herself .  She states her depression is chronic but has been worsening over the last several months. Homelessness , lack of source of income are contributing stressors .  She reports history of alcohol abuse, and states she has been drinking up to 4 24 ounce beers per day. She states she has been using crack cocaine, cannabis intermittently , no daily. Admission UDS is positive cocaine, cannabis, admission BAL is < 5.  She has not been taking any psychiatric medications over recent months, in the past was prescribed Cymbalta, but states she had stopped due to financial concerns .   History of one prior psychiatric admission back in 12/2015 for SI/ HI. At the time was diagnosed with MDD, Alcohol, Cocaine Use Disorder.She was discharged on Cymbalta.   Of note, patient also remembers having been on Zoloft in the past, and thinks it helped, also prefers it as it would be more affordable than Duloxetine  History of HTN, GERD , NKDA   Dx- MDD, with psychotic symptoms  Plan - inpatient admission, medications as below Has been started on Ativan detox protocol to minimize risk of alcohol WDL  Start  Zoloft 50 mgrs QDAY for depression Start Abilify 5 mgrs QDAY  for psychosis     Musculoskeletal: Strength & Muscle Tone: within normal limits Gait & Station: normal Patient leans: N/A  Psychiatric Specialty Exam: Physical Exam  ROS  No chest pain, no shortness of breath, no vomiting , no fever, no chills   Blood pressure 119/87, pulse (!) 46, temperature 98.6 F (37 C), resp. rate 18, height 5\' 9"  (1.753 m), weight 78.9 kg (174 lb).Body mass index is 25.7 kg/m.  General Appearance: Fairly Groomed  Eye  Contact:  Fair  Speech:  Slow  Volume:  Decreased  Mood:  Depressed  Affect:  constricted   Thought Process:  Linear and Descriptions of Associations: Intact  Orientation:  Full (Time, Place, and Person)  Thought Content:  reports auditory  hallucinations,but states has not heard any today, does not appear internally preoccupied  at present, no delusions expressed   Suicidal Thoughts:  No denies current suicidal ideations, and contracts for safety on the unit   Homicidal Thoughts:  No denies homicidal or violent ideations   Memory:  recent and remote grossly intact   Judgement:  Fair  Insight:  Fair  Psychomotor Activity:  Normal- no tremors, no diaphoresis, no psychomotor restlessness   Concentration:  Concentration: Fair and Attention Span: Fair  Recall:  [employer] of Knowledge:  Fair  Language:  Good  Akathisia:  No  Handed:  Left  AIMS (if indicated):     Assets:  Desire for Improvement Resilience  ADL's:  Intact  Cognition:  WNL  Sleep:  Number of Hours: 3.25      COGNITIVE FEATURES THAT CONTRIBUTE TO RISK:  Closed-mindedness and Loss of executive function    SUICIDE RISK:   Moderate:  Frequent suicidal ideation with limited intensity, and duration, some specificity in terms of plans, no associated intent, good self-control, limited dysphoria/symptomatology, some risk factors present, and identifiable protective factors, including available and accessible social support.  PLAN OF CARE: Patient will be admitted to inpatient psychiatric unit for stabilization and safety. Will provide and encourage milieu participation. Provide medication management and maked adjustments as needed.  Will follow daily.    I certify that inpatient services furnished can reasonably be expected to improve the patient's condition.   Jenne Campus, MD 04/09/2017, 3:32 PM
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
## 2017-04-09 NOTE — Tx Team (Addendum)
Initial Treatment Plan 04/09/2017 2:44 AM Becky [name] HOO:[id number]    PATIENT STRESSORS: Financial difficulties Loss of Relationship, 55 year old daughter living with sister Marital or family conflict Medication change or noncompliance Substance abuse   PATIENT STRENGTHS: Active sense of humor Average or above average intelligence Motivation for treatment/growth   PATIENT IDENTIFIED PROBLEMS: Depression  Substance abuse  Suicidal Ideation  "get back on my medication"  Get well for my daughter"             DISCHARGE CRITERIA:  Adequate post-discharge living arrangements Improved stabilization in mood, thinking, and/or behavior Need for constant or close observation no longer present Verbal commitment to aftercare and medication compliance Withdrawal symptoms are absent or subacute and managed without 24-hour nursing intervention  PRELIMINARY DISCHARGE PLAN: Attend 12-step recovery group Outpatient therapy Placement in alternative living arrangements  PATIENT/FAMILY INVOLVEMENT: This treatment plan has been presented to and reviewed with the patient, Becky [name].  The patient and family have been given the opportunity to ask questions and make suggestions.  Margaretann Loveless, RN 04/09/2017, 2:44 AM

## 2017-04-09 NOTE — Progress Notes (Signed)
D.  Pt pleasant on approach, denies complaints at this time.  Pt stated that she enjoyed AA group "We had a really good speaker".  Pt states that she received a lot of encouragement and good advice in group.  Pt states she is not planning to go back to boyfriend as he is an alcoholic but will not seek help.  Pt states she is going to take care of herself and her daughter.  Pt denies SI/HI/AVH at this time.  Pt observed interacting appropriately with peers on the unit.  A.  Support and encouragement offered, medication given as ordered  R.  Pt remains safe on the unit, will continue to monitor.

## 2017-04-09 NOTE — BHH Group Notes (Signed)
[place] LCSW Group Therapy  04/09/2017 10:00 AM  Type of Therapy:  Group Therapy  Participation Level:  Active  Participation Quality:  Appropriate, Attentive and Sharing  Affect:  Anxious  Cognitive:  Alert and Oriented  Insight:  Developing/Improving  Engagement in Therapy:  Developing/Improving  Modes of Intervention:  Discussion, Exploration, Rapport Building, Socialization and Support  Summary of Progress/Problems: Topic for today was thoughts and feelings regarding discharge. We discussed fears of upcoming changes including judgements, expectations and stigma of mental health issues. We then discussed supports: what constitutes a supportive framework, identification of supports and what to do when others are not supportive. Patients then identified a specific coping tool to use when others are not available. Patient identifies lack of housing as her number one stressor and acknowledges the negative impact this has on her well being and self esteem. Patient is willing to address shame and guilt with more positive self talk.    Sheilah Pigeon, LCSW

## 2017-04-09 NOTE — Progress Notes (Signed)
Patient ID: Becky [name], female   DOB: [birth year], 56 y.o.   MRN: [id number]  DAR: Pt. Denies SI/HI and A/V Hallucinations to writer during this assessment but reports she did have some thoughts last night. She is able to contract for safety. She reports sleep is fair, appetite is fair, energy level is low, and concentration is poor. She rates depression 8/10, hopelessness 10/10, and anxiety 10/10. Patient does not report heartburn and constipation and received PRN medications for this. She reports withdrawal symptoms including chilling and irritability. Support and encouragement provided to the patient. Scheduled medications administered to patient per physician's orders. Patient is minimal but cooperative. She reports some dizziness and lightheadedness this afternoon and therefore transitioned to a high fall risk. She reports one of her stressors is homelessness and reports, "I would like to try [facility] on [address] in [place]." Q15 minute checks are maintained for safety.
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
## 2017-04-09 NOTE — BHH Group Notes (Signed)
[place] Group Notes:  Identifying Primary and Secondary Emotions  Date:  04/09/2017  Time:  3:09 PM  Type of Therapy:  Psychoeducational Skills  Participation Level:  Did Not Attend  Participation Quality:  N/A  Affect:  N/A  Cognitive:  N/A  Insight:  None  Engagement in Group:  None  Modes of Intervention:  Discussion and Education  Summary of Progress/Problems:  Patient was invited to group but did not attend.   Becky [name], Becky [name] E 04/09/2017, 3:09 PM
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
## 2017-04-10 DIAGNOSIS — F1290 Cannabis use, unspecified, uncomplicated: Secondary | ICD-10-CM

## 2017-04-10 DIAGNOSIS — F1490 Cocaine use, unspecified, uncomplicated: Secondary | ICD-10-CM

## 2017-04-10 DIAGNOSIS — F1390 Sedative, hypnotic, or anxiolytic use, unspecified, uncomplicated: Secondary | ICD-10-CM

## 2017-04-10 DIAGNOSIS — F17210 Nicotine dependence, cigarettes, uncomplicated: Secondary | ICD-10-CM

## 2017-04-10 LAB — PROLACTIN: Prolactin: 20.2 ng/mL (ref 4.8–23.3)

## 2017-04-10 LAB — HEMOGLOBIN A1C
Hgb A1c MFr Bld: 5.9 % — ABNORMAL HIGH (ref 4.8–5.6)
Mean Plasma Glucose: 123 mg/dL

## 2017-04-10 MED ORDER — HYDRALAZINE HCL 10 MG PO TABS
10.0000 mg | ORAL_TABLET | Freq: Four times a day (QID) | ORAL | Status: DC | PRN
  Administered 2017-04-10: 10 mg via ORAL
  Filled 2017-04-10: qty 1

## 2017-04-10 MED ORDER — ENSURE ENLIVE PO LIQD: 237.0000 mL | Freq: Every day | ORAL | Status: DC | PRN

## 2017-04-10 MED ORDER — LISINOPRIL 5 MG PO TABS
5.0000 mg | ORAL_TABLET | Freq: Every day | ORAL | Status: DC
  Administered 2017-04-11 – 2017-04-14 (×4): 5 mg via ORAL
  Filled 2017-04-10: qty 7
  Filled 2017-04-10 (×5): qty 1

## 2017-04-10 MED ORDER — LORAZEPAM 1 MG PO TABS
1.0000 mg | ORAL_TABLET | Freq: Four times a day (QID) | ORAL | Status: AC | PRN
  Administered 2017-04-10: 1 mg via ORAL
  Filled 2017-04-10: qty 1

## 2017-04-10 NOTE — Progress Notes (Signed)
Recreation Therapy Notes  Date: 04/10/17 Time: 0930 Location: [address] Dayroom  Group Topic: Stress Management  Goal Area(s) Addresses:  Patient will verbalize importance of using healthy stress management.  Patient will identify positive emotions associated with healthy stress management.   Intervention: Stress Management  Activity :  Letting Go.  LRT introduced the stress management technique of meditation.  LRT played a meditation on the importance of letting go of the past and things that hold [place] back.  Patients were to follow along as the meditation played to participate in the activity.    Education:  Stress Management, Discharge Planning.   Education Outcome: Acknowledges edcuation/In group clarification offered/Needs additional education  Clinical Observations/Feedback: Pt did not attend group.    Victorino Sparrow, LRT/CTRS         Victorino Sparrow A 04/10/2017 11:51 AM

## 2017-04-10 NOTE — Progress Notes (Signed)
NUTRITION ASSESSMENT  Pt identified as at risk on the Malnutrition Screen Tool  INTERVENTION: 1. Educated patient on the importance of nutrition and encouraged intake of food and beverages. 2. Discussed weight goals. 3. Supplements: will change oral supplement order to Ensure Enlive once/day PRN, this supplement provides 350 kcal and 20 grams of protein.   NUTRITION DIAGNOSIS: Inadequate oral intake related to depression and polysubstance abuse as evidenced by pt report.   Goal: Pt to meet >/= 90% of their estimated nutrition needs.  Monitor:  PO intake  Assessment:  Pt admitted for severe recurrent major depression without psychosis. UDS on admission positive for cocaine and marijuana and she also reports heavy alcohol use; BAL <5 on admission. Per review, pt lost 5 lbs (2.8% body weight) in the past 6 months which is not significant for time frame. Will change Ensure order as outlined above. Continue to encourage PO intakes of meals and snacks.    56 y.o. female  Height: Ht Readings from Last 1 Encounters:  04/09/17 5\' 9"  (1.753 m)    Weight: Wt Readings from Last 1 Encounters:  04/09/17 174 lb (78.9 kg)    Weight Hx: Wt Readings from Last 10 Encounters:  04/09/17 174 lb (78.9 kg)  04/08/17 172 lb (78 kg)  10/10/16 177 lb (80.3 kg)  05/11/16 164 lb 14.4 oz (74.8 kg)  03/26/16 157 lb 11.2 oz (71.5 kg)  03/10/16 159 lb 8 oz (72.3 kg)  01/19/16 163 lb (73.9 kg)  01/18/16 165 lb (74.8 kg)  01/17/16 164 lb (74.4 kg)  11/12/15 164 lb (74.4 kg)    BMI:  Body mass index is 25.7 kg/m. Pt meets criteria for overweight based on current BMI.  Estimated Nutritional Needs: Kcal: 25-30 kcal/kg Protein: > 1 gram protein/kg Fluid: 1 ml/kcal  Diet Order: Diet regular Room service appropriate? Yes; Fluid consistency: Thin Pt is also offered choice of unit snacks mid-morning and mid-afternoon.  Pt is eating as desired.   Lab results and medications reviewed.     Jarome Matin, MS, RD, LDN, [facility] Inpatient Clinical Dietitian Pager # [phone] After hours/weekend pager # [phone]

## 2017-04-10 NOTE — Progress Notes (Addendum)
Patient ID: Becky [name], female   DOB: [birth year], 56 y.o.   MRN: [id number] D: Client in most bed this shift, "I'm alright, I just want to sleep" A: Writer encouraged client to report any concerns. Medications reviewed, administered as ordered. BP elevated, asymptomatic denies chest pain, nausea/vomiting. Staff will monitor q45min for safety. A: Probation officer provided emotional support, staffed with Lonell Grandchild, PA new order written for BP (see MAR) Administered hydralazine 10 mg. Reassessed for efficacy (see docuflow). Staff will continue to monitor q43min for safety. R: Client is safe on the unit, attended group.

## 2017-04-10 NOTE — Progress Notes (Signed)
Patient reported increased in depression stating that "I just feel so down".  Patient reported feelings of anxiety and suicidal ideations without a plan.  Patient endorses auditory hallucinations of a voice stating "Jump through the window"  Patient has been compliant with medications and attending groups.   Assess patient for safety, offer medications as prescribed, engage patient in 1:1 staff talks, consult with the treatment team about patient's plan of care.   Continue to monitor.  Patient able to contract for safety.

## 2017-04-10 NOTE — [facility] Group Notes (Signed)
[facility] LCSW Group Therapy  04/10/2017 11:29 AM  Type of Therapy:  Group Therapy  Participation Level: Invited. Chose to remain in bed.   Modes of Intervention:  Confrontation, Discussion, Education, Problem-solving, Socialization and Support  Summary of Progress/Problems: Today's Topic: Overcoming Obstacles. Patients identified one short term goal and potential obstacles in reaching this goal. Patients processed barriers involved in overcoming these obstacles. Patients identified steps necessary for overcoming these obstacles and explored motivation (internal and external) for facing these difficulties head on.   Salia Cangemi N Smart LCSW 04/10/2017, 11:29 AM

## 2017-04-10 NOTE — Tx Team (Signed)
Interdisciplinary Treatment and Diagnostic Plan Update  04/10/2017 Time of Session: Becky [name] MRN: [id number]  Principal Diagnosis: Severe recurrent major depression without psychotic features [facility])  Secondary Diagnoses: Principal Problem:   Severe recurrent major depression without psychotic features ([place])   Current Medications:  Current Facility-Administered Medications  Medication Dose Route Frequency Provider Last Rate Last Dose  . acetaminophen (TYLENOL) tablet 650 mg  650 mg Oral Q6H PRN Lindon Romp A, NP      . alum & mag hydroxide-simeth (MAALOX/MYLANTA) 200-200-20 MG/5ML suspension 30 mL  30 mL Oral Q4H PRN Lindon Romp A, NP   30 mL at 04/09/17 0850  . ARIPiprazole (ABILIFY) tablet 5 mg  5 mg Oral Daily Cobos, Myer Peer, MD   5 mg at 04/10/17 0800  . feeding supplement (ENSURE ENLIVE) (ENSURE ENLIVE) liquid 237 mL  237 mL Oral Daily PRN Cobos, Myer Peer, MD      . hydrOXYzine (ATARAX/VISTARIL) tablet 25 mg  25 mg Oral Q6H PRN Lindon Romp A, NP      . ibuprofen (ADVIL,MOTRIN) tablet 600 mg  600 mg Oral Q6H PRN Lindon Romp A, NP      . loperamide (IMODIUM) capsule 2-4 mg  2-4 mg Oral PRN Lindon Romp A, NP      . LORazepam (ATIVAN) tablet 1 mg  1 mg Oral Q6H PRN Lindon Romp A, NP      . LORazepam (ATIVAN) tablet 1 mg  1 mg Oral TID Lindon Romp A, NP   1 mg at 04/10/17 0800   Followed by  . [START ON 04/11/2017] LORazepam (ATIVAN) tablet 1 mg  1 mg Oral BID Rozetta Nunnery, NP       Followed by  . [START ON 04/12/2017] LORazepam (ATIVAN) tablet 1 mg  1 mg Oral Daily Lindon Romp A, NP      . magnesium hydroxide (MILK OF MAGNESIA) suspension 30 mL  30 mL Oral Daily PRN Lindon Romp A, NP   30 mL at 04/09/17 1647  . multivitamin with minerals tablet 1 tablet  1 tablet Oral Daily Lindon Romp A, NP   1 tablet at 04/10/17 0800  . nicotine (NICODERM CQ - dosed in mg/24 hours) patch 21 mg  21 mg Transdermal Daily Cobos, Myer Peer, MD   21 mg at 04/10/17 0801  .  ondansetron (ZOFRAN-ODT) disintegrating tablet 4 mg  4 mg Oral Q6H PRN Lindon Romp A, NP      . oxymetazoline (AFRIN) 0.05 % nasal spray 1 spray  1 spray Each Nare BID PRN Lindon Romp A, NP      . pantoprazole (PROTONIX) EC tablet 20 mg  20 mg Oral Daily Cobos, Myer Peer, MD   20 mg at 04/10/17 0801  . sertraline (ZOLOFT) tablet 50 mg  50 mg Oral Daily Cobos, Myer Peer, MD   50 mg at 04/10/17 0800  . thiamine (VITAMIN B-1) tablet 100 mg  100 mg Oral Daily Lindon Romp A, NP   100 mg at 04/10/17 0800   PTA Medications: Prescriptions Prior to Admission  Medication Sig Dispense Refill Last Dose  . diclofenac (VOLTAREN) 50 MG EC tablet Take 1 tablet (50 mg total) by mouth 2 (two) times daily. (Patient not taking: Reported on 04/08/2017) 14 tablet 0 Not Taking at Unknown time  . diphenhydramine-acetaminophen (TYLENOL PM) 25-500 MG TABS tablet Take 3 tablets by mouth at bedtime.   04/07/2017 at Unknown time  . DULoxetine (CYMBALTA) 30 MG capsule Take 1 capsule (30  mg total) by mouth 2 (two) times daily. (Patient not taking: Reported on 04/08/2017) 60 capsule 0 Not Taking at Unknown time  . ibuprofen (ADVIL,MOTRIN) 200 MG tablet Take 1,000 mg by mouth every 6 (six) hours as needed for headache or moderate pain.   unknown  . lisinopril (PRINIVIL,ZESTRIL) 10 MG tablet Take 1 tablet (10 mg total) by mouth daily. (Patient not taking: Reported on 04/08/2017) 30 tablet 0 Not Taking at Unknown time  . oxymetazoline (AFRIN) 0.05 % nasal spray Place 1 spray into both nostrils 2 (two) times daily as needed for congestion.   Past Week at Unknown time  . phenazopyridine (PYRIDIUM) 200 MG tablet Take 1 tablet (200 mg total) by mouth 3 (three) times daily. (Patient not taking: Reported on 04/08/2017) 6 tablet 0 Not Taking at Unknown time    Patient Stressors: Financial difficulties Loss of Relationship, 79 year old daughter living with sister Marital or family conflict Medication change or noncompliance Substance  abuse  Patient Strengths: Active sense of humor Average or above average intelligence Motivation for treatment/growth  Treatment Modalities: Medication Management, Group therapy, Case management,  1 to 1 session with clinician, Psychoeducation, Recreational therapy.   Physician Treatment Plan for Primary Diagnosis: Severe recurrent major depression without psychotic features ([place]) Long Term Goal(s): Improvement in symptoms so as ready for discharge Improvement in symptoms so as ready for discharge   Short Term Goals: Ability to identify changes in lifestyle to reduce recurrence of condition will improve Ability to verbalize feelings will improve Ability to disclose and discuss suicidal ideas Ability to demonstrate self-control will improve Ability to disclose and discuss suicidal ideas Ability to demonstrate self-control will improve Ability to maintain clinical measurements within normal limits will improve  Medication Management: Evaluate patient's response, side effects, and tolerance of medication regimen.  Therapeutic Interventions: 1 to 1 sessions, Unit Group sessions and Medication administration.  Evaluation of Outcomes: Progressing  Physician Treatment Plan for Secondary Diagnosis: Principal Problem:   Severe recurrent major depression without psychotic features ([place])  Long Term Goal(s): Improvement in symptoms so as ready for discharge Improvement in symptoms so as ready for discharge   Short Term Goals: Ability to identify changes in lifestyle to reduce recurrence of condition will improve Ability to verbalize feelings will improve Ability to disclose and discuss suicidal ideas Ability to demonstrate self-control will improve Ability to disclose and discuss suicidal ideas Ability to demonstrate self-control will improve Ability to maintain clinical measurements within normal limits will improve     Medication Management: Evaluate patient's response, side effects,  and tolerance of medication regimen.  Therapeutic Interventions: 1 to 1 sessions, Unit Group sessions and Medication administration.  Evaluation of Outcomes: Progressing   RN Treatment Plan for Primary Diagnosis: Severe recurrent major depression without psychotic features ([place]) Long Term Goal(s): Knowledge of disease and therapeutic regimen to maintain health will improve  Short Term Goals: Ability to remain free from injury will improve, Ability to verbalize feelings will improve and Ability to disclose and discuss suicidal ideas  Medication Management: RN will administer medications as ordered by provider, will assess and evaluate patient's response and provide education to patient for prescribed medication. RN will report any adverse and/or side effects to prescribing provider.  Therapeutic Interventions: 1 on 1 counseling sessions, Psychoeducation, Medication administration, Evaluate responses to treatment, Monitor vital signs and CBGs as ordered, Perform/monitor CIWA, COWS, AIMS and Fall Risk screenings as ordered, Perform wound care treatments as ordered.  Evaluation of Outcomes: Progressing   LCSW  Treatment Plan for Primary Diagnosis: Severe recurrent major depression without psychotic features ([place]) Long Term Goal(s): Safe transition to appropriate next level of care at discharge, Engage patient in therapeutic group addressing interpersonal concerns.  Short Term Goals: Engage patient in aftercare planning with referrals and resources, Facilitate patient progression through stages of change regarding substance use diagnoses and concerns and Identify triggers associated with mental health/substance abuse issues  Therapeutic Interventions: Assess for all discharge needs, 1 to 1 time with Social worker, Explore available resources and support systems, Assess for adequacy in community support network, Educate family and significant other(s) on suicide prevention, Complete Psychosocial  Assessment, Interpersonal group therapy.  Evaluation of Outcomes: Progressing   Progress in Treatment: Attending groups: Yes. Participating in groups: Yes. Taking medication as prescribed: Yes. Toleration medication: Yes. Family/Significant other contact made: No, will contact:  pt's sister, Ms.Turnewee [phone] Patient understands diagnosis: Yes. Discussing patient identified problems/goals with staff: Yes. Medical problems stabilized or resolved: Yes. Denies suicidal/homicidal ideation: Yes. Issues/concerns per patient self-inventory: No. Other: n/a   New problem(s) identified: No, Describe:  n/a  New Short Term/Long Term Goal(s): medication stabilization; detox; elimination of SI thoughts; development of comprehensive mental wellness/sobriety plan.   Discharge Plan or Barriers: CSW assessing for appropriate referrals. Pt interested in [place] [facility].   Reason for Continuation of Hospitalization: Depression Medication stabilization Suicidal ideation Withdrawal symptoms  Estimated Length of Stay: 3-5 days   Attendees: Patient: 04/10/2017 10:13 AM  Physician: Dr. Parke Poisson MD 04/10/2017 10:13 AM  Nursing: Roanna Banning RN 04/10/2017 10:13 AM  RN Care Manager: Lars Pinks CM 04/10/2017 10:13 AM  Social Worker: Nira Conn Smart, LCSW; Adriana Reams LCSW 04/10/2017 10:13 AM  Recreational Therapist: x 04/10/2017 10:13 AM  Other: Lindell Spar NP; May Augustin NP 04/10/2017 10:13 AM  Other:  04/10/2017 10:13 AM  Other: 04/10/2017 10:13 AM    Scribe for Treatment Team: Kimber Relic Smart, LCSW 04/10/2017 10:13 AM

## 2017-04-10 NOTE — Progress Notes (Signed)
Adult Psychoeducational Group Note  Date:  04/10/2017 Time:  10:56 PM  Group Topic/Focus:  Wrap-Up Group:   The focus of this group is to help patients review their daily goal of treatment and discuss progress on daily workbooks.  Participation Level:  Active  Participation Quality:  Appropriate and Attentive  Affect:  Appropriate  Cognitive:  Appropriate  Insight: Appropriate  Engagement in Group:  Engaged  Modes of Intervention:  Discussion  Additional Comments:  Pt stated her goal is to make it through another day to be a better person and live a better life. Pt stated the meds have been helpful.  Clint Bolder 04/10/2017, 10:56 PM

## 2017-04-10 NOTE — Progress Notes (Signed)
[facility] MD Progress Note  04/10/2017 2:32 PM Becky [name]  MRN:  [id number] Subjective:  "I just need to feel better.  I need help with my depression.: Objective:  Becky [name], 56 yo female, pleasant on approach.  She states that she is here for depression.  She c/o of hyper somnolence, she is on an Ativan taper.  BAL <5.  She stated that she almost fell as she was unsteady on her feet.  She is identified on unit as a falls risk.  She is not disruptive.  She is compliant with meds.  She is tolerating the meds without adverse side effects reported.   Principal Problem: Severe recurrent major depression without psychotic features ([place]) Diagnosis:   Patient Active Problem List   Diagnosis Date Noted  . Severe recurrent major depression without psychotic features ([place]) [F33.2] 04/09/2017  . Chronic hepatitis C without hepatic coma ([place]) [B18.2]   . Alcohol use disorder, severe, dependence ([place]) [F10.20] 01/20/2016  . Cocaine use disorder, severe, dependence ([place]) [F14.20] 01/20/2016  . 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 Chronic pain syndrome [G89.4] 01/20/2016  . Hepatitis C [B19.20] 01/20/2016  . Prolapsed internal hemorrhoids [K64.8] 06/08/2015  . Constipation [K59.00] 06/08/2015  . Major depressive disorder, recurrent, severe without psychotic features ([place]) [F33.2]    Total Time spent with patient: 30 minutes  Past Psychiatric History: see HPI  Past Medical History:  Past Medical History:  Diagnosis Date  . Bronchitis   . Cocaine abuse   . Depression   . Headache   . Hypertension   . MDD (major depressive disorder)     Past Surgical History:  Procedure Laterality Date  . COLONOSCOPY WITH PROPOFOL N/A 06/23/2015   SLF: 1. Rectal bleeding/pain due to hemorrhoids2. one large and one small colorectal polyp removed.   . cysts removed from armpits bilaterally    . POLYPECTOMY N/A 06/23/2015   Procedure: POLYPECTOMY;  Surgeon: Danie Binder, MD;  Location: [facility];   Service: Endoscopy;  Laterality: N/A;  descending colon   Family History:  Family History  Problem Relation Age of Onset  . Heart failure Mother   . Coronary artery disease Mother   . Heart failure Father   . Coronary artery disease Father   . Colon cancer Neg Hx   . Mental illness Neg Hx    Family Psychiatric  History: see HPI Social History:  History  Alcohol Use  . 0.6 oz/week  . 1 Cans of beer per week     History  Drug Use  . Types: Benzodiazepines, Cocaine, Marijuana    Comment: "anything I can get my hands on except needles"    Social History   Social History  . Marital status: Single    Spouse name: N/A  . Number of children: N/A  . Years of education: N/A   Social History Main Topics  . Smoking status: Current Some Day Smoker    Packs/day: 0.50    Types: Cigarettes  . Smokeless tobacco: Never Used     Comment: vague reports of smoking intermittently   . Alcohol use 0.6 oz/week    1 Cans of beer per week  . Drug use: Yes    Types: Benzodiazepines, Cocaine, Marijuana     Comment: "anything I can get my hands on except needles"  . Sexual activity: Yes   Other Topics Concern  . None   Social History Narrative  . None   Additional Social History:  Sleep: Fair  Appetite:  Fair  Current Medications: Current Facility-Administered Medications  Medication Dose Route Frequency Provider Last Rate Last Dose  . acetaminophen (TYLENOL) tablet 650 mg  650 mg Oral Q6H PRN Lindon Romp A, NP      . alum & mag hydroxide-simeth (MAALOX/MYLANTA) 200-200-20 MG/5ML suspension 30 mL  30 mL Oral Q4H PRN Lindon Romp A, NP   30 mL at 04/09/17 0850  . ARIPiprazole (ABILIFY) tablet 5 mg  5 mg Oral Daily Cobos, Myer Peer, MD   5 mg at 04/10/17 0800  . feeding supplement (ENSURE ENLIVE) (ENSURE ENLIVE) liquid 237 mL  237 mL Oral Daily PRN Cobos, Myer Peer, MD      . hydrOXYzine (ATARAX/VISTARIL) tablet 25 mg  25 mg Oral Q6H PRN Lindon Romp A, NP      . ibuprofen (ADVIL,MOTRIN) tablet 600 mg  600 mg Oral Q6H PRN Lindon Romp A, NP      . loperamide (IMODIUM) capsule 2-4 mg  2-4 mg Oral PRN Lindon Romp A, NP      . LORazepam (ATIVAN) tablet 1 mg  1 mg Oral Q6H PRN Lindon Romp A, NP      . LORazepam (ATIVAN) tablet 1 mg  1 mg Oral TID Lindon Romp A, NP   1 mg at 04/10/17 0800   Followed by  . [START ON 04/11/2017] LORazepam (ATIVAN) tablet 1 mg  1 mg Oral BID Rozetta Nunnery, NP       Followed by  . [START ON 04/12/2017] LORazepam (ATIVAN) tablet 1 mg  1 mg Oral Daily Lindon Romp A, NP      . magnesium hydroxide (MILK OF MAGNESIA) suspension 30 mL  30 mL Oral Daily PRN Lindon Romp A, NP   30 mL at 04/09/17 1647  . multivitamin with minerals tablet 1 tablet  1 tablet Oral Daily Lindon Romp A, NP   1 tablet at 04/10/17 0800  . nicotine (NICODERM CQ - dosed in mg/24 hours) patch 21 mg  21 mg Transdermal Daily Cobos, Myer Peer, MD   21 mg at 04/10/17 0801  . ondansetron (ZOFRAN-ODT) disintegrating tablet 4 mg  4 mg Oral Q6H PRN Lindon Romp A, NP      . oxymetazoline (AFRIN) 0.05 % nasal spray 1 spray  1 spray Each Nare BID PRN Lindon Romp A, NP      . pantoprazole (PROTONIX) EC tablet 20 mg  20 mg Oral Daily Cobos, Myer Peer, MD   20 mg at 04/10/17 0801  . sertraline (ZOLOFT) tablet 50 mg  50 mg Oral Daily Cobos, Myer Peer, MD   50 mg at 04/10/17 0800  . thiamine (VITAMIN B-1) tablet 100 mg  100 mg Oral Daily Lindon Romp A, NP   100 mg at 04/10/17 0800    Lab Results:  Results for orders placed or performed during the hospital encounter of 04/09/17 (from the past 48 hour(s))  Hemoglobin A1c     Status: Abnormal   Collection Time: 04/09/17  6:42 AM  Result Value Ref Range   Hgb A1c MFr Bld 5.9 (H) 4.8 - 5.6 %    Comment: (NOTE)         Pre-diabetes: 5.7 - 6.4         Diabetes: >6.4         Glycemic control for adults with diabetes: <7.0    Mean Plasma Glucose 123 mg/dL    Comment: (NOTE) Performed At: [facility] [address]  [place], [place] [id number] Lindon Romp MD [id number] Performed at [facility], [place] [address] [place], [place] [address]   Lipid panel     Status: Abnormal   Collection Time: 04/09/17  6:42 AM  Result Value Ref Range   Cholesterol 228 (H) 0 - 200 mg/dL   Triglycerides 191 (H) <150 mg/dL   HDL 58 >40 mg/dL   Total CHOL/HDL Ratio 3.9 RATIO   VLDL 38 0 - 40 mg/dL   LDL Cholesterol 132 (H) 0 - 99 mg/dL    Comment:        Total Cholesterol/HDL:CHD Risk Coronary Heart Disease Risk Table                     Men   Women  1/2 Average Risk   3.4   3.3  Average Risk       5.0   4.4  2 X Average Risk   9.6   7.1  3 X Average Risk  23.4   11.0        Use the calculated Patient Ratio above and the CHD Risk Table to determine the patient's CHD Risk.        ATP III CLASSIFICATION (LDL):  <100     mg/dL   Optimal  100-129  mg/dL   Near or Above                    Optimal  130-159  mg/dL   Borderline  160-189  mg/dL   High  >190     mg/dL   Very High Performed at [facility] [address] [place], [place] [address]   TSH     Status: None   Collection Time: 04/09/17  6:42 AM  Result Value Ref Range   TSH 4.147 0.350 - 4.500 uIU/mL    Comment: Performed by a 3rd Generation assay with a functional sensitivity of <=0.01 uIU/mL. Performed at [facility], [place] [address] [place], [place] [address]   Prolactin     Status: None   Collection Time: 04/09/17  6:42 AM  Result Value Ref Range   Prolactin 20.2 4.8 - 23.3 ng/mL    Comment: (NOTE) Performed At: [facility] [place], [place] [id number] Lindon Romp MD [id number] Performed at [facility], [place] [address] [place], [place] [address]     Blood Alcohol level:  Lab Results  Component Value Date   ETH <5 04/08/2017   ETH 5 (H) 95/63/8756    Metabolic Disorder Labs: Lab Results  Component Value  Date   HGBA1C 5.9 (H) 04/09/2017   MPG 123 04/09/2017   Lab Results  Component Value Date   PROLACTIN 20.2 04/09/2017   Lab Results  Component Value Date   CHOL 228 (H) 04/09/2017   TRIG 191 (H) 04/09/2017   HDL 58 04/09/2017   CHOLHDL 3.9 04/09/2017   VLDL 38 04/09/2017   LDLCALC 132 (H) 04/09/2017    Physical Findings: AIMS: Facial and Oral Movements Muscles of Facial Expression: None, normal Lips and Perioral Area: None, normal Jaw: None, normal Tongue: None, normal,Extremity Movements Upper (arms, wrists, hands, fingers): None, normal Lower (legs, knees, ankles, toes): None, normal, Trunk Movements Neck, shoulders, hips: None, normal, Overall Severity Severity of abnormal movements (highest score from questions above): None, normal Incapacitation due to abnormal movements: None, normal Patient's awareness of abnormal movements (rate only patient's report): No Awareness, Dental Status  Current problems with teeth and/or dentures?: No Does patient usually wear dentures?: No  CIWA:  CIWA-Ar Total: 0 COWS:     Musculoskeletal: Strength & Muscle Tone: within normal limits Gait & Station: normal Patient leans: N/A  Psychiatric Specialty Exam: Physical Exam  Nursing note and vitals reviewed. Psychiatric: She is slowed. She exhibits a depressed mood.    ROS  Blood pressure (!) 153/83, pulse (!) 53, temperature 97.9 F (36.6 C), temperature source Oral, resp. rate 16, height 5\' 9"  (1.753 m), weight 78.9 kg (174 lb).Body mass index is 25.7 kg/m.  General Appearance: Fairly Groomed  Eye Contact:  Fair  Speech:  Slow  Volume:  Decreased  Mood:  Depressed  Affect:  Constricted  Thought Process:  Linear  Orientation:  Full (Time, Place, and Person)  Thought Content:  Rumination  Suicidal Thoughts:  No  Homicidal Thoughts:  No  Memory:  Immediate;   Fair Recent;   Fair Remote;   Fair  Judgement:  Fair  Insight:  Fair  Psychomotor Activity:  Normal  Concentration:   Concentration: Fair and Attention Span: Fair  Recall:  [employer] of Knowledge:  Fair  Language:  Fair  Akathisia:  No  Handed:  Left  AIMS (if indicated):     Assets:  Communication Skills Desire for Improvement  ADL's:  Intact  Cognition:  WNL  Sleep:  Number of Hours: 6.5    Treatment Plan Summary: Review of chart, vital signs, medications, and notes.  1-Individual and group therapy  2-Medication management for depression and anxiety: Medications reviewed with the patient and she stated no untoward effects, unchanged.  Has been started on Ativan detox protocol to minimize risk of alcohol WDL however, it is dc'd due to hypersomnolence and she feels she is less alert.  Will keep Ativan Q6 hrs PRN. Cont Zoloft 50 mgrs QDAY for depression - tolerating Cont Abilify 5 mgrs QDAY  for psychosis  3-Coping skills for depression, anxiety  4-Continue crisis stabilization and management  5-Address health issues--monitoring vital signs, stable  6-Treatment plan in progress to prevent relapse of depression and anxiety  Janett Labella, NP [facility] 04/10/2017, 2:32 PM

## 2017-04-10 NOTE — Progress Notes (Signed)
CSW spoke with Juliann Pulse at [facility]. One female bed available $300 admission fee required and TB test required. Pt provided with client screening form and notified of $300 admission fee. TB test requested.   Maxie Better, MSW, LCSW Clinical Social Worker 04/10/2017 10:56 AM

## 2017-04-10 NOTE — Progress Notes (Signed)
Adult Psychoeducational Group Note  Date:  04/10/2017 Time:  12:04 AM  Group Topic/Focus:  Wrap-Up Group:   The focus of this group is to help patients review their daily goal of treatment and discuss progress on daily workbooks.  Participation Level:  Did Not Attend  Participation Quality:  Patient did not attend  Affect:  Patient did not attend  Cognitive:  Patient did not attend  Insight: None  Engagement in Group:  Patient did not attend  Modes of Intervention:  Patient did not attend  Additional Comments:  Patient did not attend wrap up group. Patient attended the [place] group.  Candy Sledge 04/10/2017, 12:04 AM

## 2017-04-11 DIAGNOSIS — I10 Essential (primary) hypertension: Secondary | ICD-10-CM

## 2017-04-11 DIAGNOSIS — J40 Bronchitis, not specified as acute or chronic: Secondary | ICD-10-CM

## 2017-04-11 DIAGNOSIS — Z590 Homelessness: Secondary | ICD-10-CM

## 2017-04-11 MED ORDER — TRAZODONE HCL 50 MG PO TABS
50.0000 mg | ORAL_TABLET | Freq: Every evening | ORAL | Status: DC | PRN
  Administered 2017-04-11 – 2017-04-12 (×2): 50 mg via ORAL
  Filled 2017-04-11 (×2): qty 1

## 2017-04-11 NOTE — [facility] Group Notes (Signed)
[facility] LCSW Group Therapy  04/11/2017 2:27 PM  Type of Therapy:  Group Therapy  Participation Level:  Did Not Attend-pt invited. Chose to remain in bed.   Summary of Progress/Problems: [place] speaker did not come today. CSW provided handouts and educational information pertaining to groups and services offered by the [facility]. Patients also discussed obstacles/barriers to discharge and ways to cope with stress/anxiety/depression in addition to medication management.   Shamona Wirtz N Smart LCSW 04/11/2017, 2:27 PM

## 2017-04-11 NOTE — Progress Notes (Signed)
[facility] MD Progress Note  04/11/2017 4:52 PM Becky [name]  MRN:  [id number] Subjective:  56 yo AAF single, homeless, unemployed. Background history of SUD and Depressive disorder. Self presented to the ER. Expressed feelings of worthlessness and hopelessness. Has been using alcohol, cocaine and THC regularly. Expressed command auditory hallucination to end her life. Has been off her medications due to cost.  Chart reviewed today. Patient discussed at team  Staff reports that she has limited interactions. She is flat most of the time. Isolated self most of yesterday. No side effects from her medications. Has not reported any suicidal thoughts. Not been observed to be internally stimulated.   Seen today. Says she wants to get into [employer] in [place]. Says they want $300 monthly. Feels she might not be able to afford it. Feels marginally better since starting medications. Says her outlook still looks hopeless. She has two minor kids in custody of her sister. Says her grown kids are not able to help her. Patient was tearful during the interview. Says she hopes to get out of the hospital soon. Minimizes any futility thoughts. No hallucination in any modality. No abnormal belief. No persecution. Says she has been tolerating her medications well.  Principal Problem: Severe recurrent major depression without psychotic features ([place]) Diagnosis:   Patient Active Problem List   Diagnosis Date Noted  . Severe recurrent major depression without psychotic features ([place]) [F33.2] 04/09/2017  . Chronic hepatitis C without hepatic coma ([place]) [B18.2]   . Alcohol use disorder, severe, dependence ([place]) [F10.20] 01/20/2016  . Cocaine use disorder, severe, dependence ([place]) [F14.20] 01/20/2016  . 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 Chronic pain syndrome [G89.4] 01/20/2016  . Hepatitis C [B19.20] 01/20/2016  . Prolapsed internal hemorrhoids [K64.8] 06/08/2015  . Constipation [K59.00]  06/08/2015  . Major depressive disorder, recurrent, severe without psychotic features ([place]) [F33.2]    Total Time spent with patient: 20 minutes  Past Psychiatric History: As in H&P  Past Medical History:  Past Medical History:  Diagnosis Date  . Bronchitis   . Cocaine abuse   . Depression   . Headache   . Hypertension   . MDD (major depressive disorder)     Past Surgical History:  Procedure Laterality Date  . COLONOSCOPY WITH PROPOFOL N/A 06/23/2015   SLF: 1. Rectal bleeding/pain due to hemorrhoids2. one large and one small colorectal polyp removed.   . cysts removed from armpits bilaterally    . POLYPECTOMY N/A 06/23/2015   Procedure: POLYPECTOMY;  Surgeon: Danie Binder, MD;  Location: [facility];  Service: Endoscopy;  Laterality: N/A;  descending colon   Family History:  Family History  Problem Relation Age of Onset  . Heart failure Mother   . Coronary artery disease Mother   . Heart failure Father   . Coronary artery disease Father   . Colon cancer Neg Hx   . Mental illness Neg Hx    Family Psychiatric  History: As in H&P Social History:  History  Alcohol Use  . 0.6 oz/week  . 1 Cans of beer per week     History  Drug Use  . Types: Benzodiazepines, Cocaine, Marijuana    Comment: "anything I can get my hands on except needles"    Social History   Social History  . Marital status: Single    Spouse name: N/A  . Number of children: N/A  . Years of education: N/A   Social History Main Topics  . Smoking status:  Current Some Day Smoker    Packs/day: 0.50    Types: Cigarettes  . Smokeless tobacco: Never Used     Comment: vague reports of smoking intermittently   . Alcohol use 0.6 oz/week    1 Cans of beer per week  . Drug use: Yes    Types: Benzodiazepines, Cocaine, Marijuana     Comment: "anything I can get my hands on except needles"  . Sexual activity: Yes   Other Topics Concern  . None   Social History Narrative  . None   Additional Social History:         Sleep: Good  Appetite:  Good  Current Medications: Current Facility-Administered Medications  Medication Dose Route Frequency Provider Last Rate Last Dose  . acetaminophen (TYLENOL) tablet 650 mg  650 mg Oral Q6H PRN Lindon Romp A, NP   650 mg at 04/10/17 1851  . alum & mag hydroxide-simeth (MAALOX/MYLANTA) 200-200-20 MG/5ML suspension 30 mL  30 mL Oral Q4H PRN Lindon Romp A, NP   30 mL at 04/11/17 0755  . ARIPiprazole (ABILIFY) tablet 5 mg  5 mg Oral Daily Cobos, Myer Peer, MD   5 mg at 04/11/17 0752  . feeding supplement (ENSURE ENLIVE) (ENSURE ENLIVE) liquid 237 mL  237 mL Oral Daily PRN Cobos, Fernando A, MD      . hydrALAZINE (APRESOLINE) tablet 10 mg  10 mg Oral Q6H PRN Patriciaann Clan E, PA-C   10 mg at 04/10/17 2157  . hydrOXYzine (ATARAX/VISTARIL) tablet 25 mg  25 mg Oral Q6H PRN Lindon Romp A, NP      . ibuprofen (ADVIL,MOTRIN) tablet 600 mg  600 mg Oral Q6H PRN Lindon Romp A, NP      . lisinopril (PRINIVIL,ZESTRIL) tablet 5 mg  5 mg Oral Daily Patriciaann Clan E, PA-C   5 mg at 04/11/17 3825  . loperamide (IMODIUM) capsule 2-4 mg  2-4 mg Oral PRN Lindon Romp A, NP      . LORazepam (ATIVAN) tablet 1 mg  1 mg Oral Q6H PRN Kerrie Buffalo, NP   1 mg at 04/10/17 2157  . magnesium hydroxide (MILK OF MAGNESIA) suspension 30 mL  30 mL Oral Daily PRN Lindon Romp A, NP   30 mL at 04/09/17 1647  . multivitamin with minerals tablet 1 tablet  1 tablet Oral Daily Lindon Romp A, NP   1 tablet at 04/11/17 0753  . nicotine (NICODERM CQ - dosed in mg/24 hours) patch 21 mg  21 mg Transdermal Daily Cobos, Myer Peer, MD   21 mg at 04/11/17 0753  . ondansetron (ZOFRAN-ODT) disintegrating tablet 4 mg  4 mg Oral Q6H PRN Lindon Romp A, NP      . oxymetazoline (AFRIN) 0.05 % nasal spray 1 spray  1 spray Each Nare BID PRN Lindon Romp A, NP      . pantoprazole (PROTONIX) EC tablet 20 mg  20 mg Oral Daily Cobos, Myer Peer, MD   20 mg at 04/11/17 0752  . sertraline (ZOLOFT) tablet 50 mg  50  mg Oral Daily Cobos, Myer Peer, MD   50 mg at 04/11/17 0754  . thiamine (VITAMIN B-1) tablet 100 mg  100 mg Oral Daily Lindon Romp A, NP   100 mg at 04/11/17 0539    Lab Results: No results found for this or any previous visit (from the past 48 hour(s)).  Blood Alcohol level:  Lab Results  Component Value Date   ETH <5 04/08/2017   ETH 5 (H)  73/71/0626    Metabolic Disorder Labs: Lab Results  Component Value Date   HGBA1C 5.9 (H) 04/09/2017   MPG 123 04/09/2017   Lab Results  Component Value Date   PROLACTIN 20.2 04/09/2017   Lab Results  Component Value Date   CHOL 228 (H) 04/09/2017   TRIG 191 (H) 04/09/2017   HDL 58 04/09/2017   CHOLHDL 3.9 04/09/2017   VLDL 38 04/09/2017   LDLCALC 132 (H) 04/09/2017    Physical Findings: AIMS: Facial and Oral Movements Muscles of Facial Expression: None, normal Lips and Perioral Area: None, normal Jaw: None, normal Tongue: None, normal,Extremity Movements Upper (arms, wrists, hands, fingers): None, normal Lower (legs, knees, ankles, toes): None, normal, Trunk Movements Neck, shoulders, hips: None, normal, Overall Severity Severity of abnormal movements (highest score from questions above): None, normal Incapacitation due to abnormal movements: None, normal Patient's awareness of abnormal movements (rate only patient's report): No Awareness, Dental Status Current problems with teeth and/or dentures?: No Does patient usually wear dentures?: No  CIWA:  CIWA-Ar Total: 1 COWS:  COWS Total Score: 1  Musculoskeletal: Strength & Muscle Tone: within normal limits Gait & Station: normal Patient leans: N/A  Psychiatric Specialty Exam: Physical Exam  Constitutional: [name], place, and time. She appears well-developed and well-nourished.  HENT:  Head: Normocephalic and atraumatic.  Eyes: Conjunctivae are normal. Pupils are equal, round, and reactive to light.  Neck: Normal range of motion. Neck supple.   Cardiovascular: Normal rate and regular rhythm.   Respiratory: Effort normal and breath sounds normal.  GI: Soft. Bowel sounds are normal.  Musculoskeletal: Normal range of motion.  Neurological: She is alert and oriented to person, place, and time.  Skin: Skin is warm and dry.  Psychiatric:  As above    ROS  Blood pressure (!) 118/93, pulse 64, temperature 98 F (36.7 C), temperature source Oral, resp. rate 16, height 5\' 9"  (1.753 m), weight 78.9 kg (174 lb).Body mass index is 25.7 kg/m.  General Appearance:  Casually dressed. Was at the day room watching TV just before interview. Became tearful during interview.   Eye Contact:  Minimal  Speech:  Slow  Volume:  Decreased  Mood:  Depressed and Hopeless  Affect:  Blunted and mood congruent  Thought Process:  Linear, slow speed of thought.  Orientation:  Full (Time, Place, and Person)  Thought Content:  Hopeless and worthlessness.   Suicidal Thoughts:  Would not respond but wants to be discharged.   Homicidal Thoughts:  No  Memory:  Unable to assess at this time  Judgement:  Fair  Insight:  Partial as she is not considering addiction treatment at this time.   Psychomotor Activity:  Decreased  Concentration:  Limited  Recall:  Did not assess  Fund of Knowledge:  Fair  Language:  Good  Akathisia:  Negative  Handed:    AIMS (if indicated):     Assets:  Physical Health  ADL's:  Intact  Cognition:  WNL  Sleep:  Number of Hours: 6.5     Treatment Plan Summary: Patient is still pervasively depressed. She is tolerating recent medications well. She is still a danger to self. Needs further inpatient stabilization.  Psychiatric: SUD Substance Induced Mood Disiorder Persistent Depressive Disorder  Medical: HTN Bronchitis  Psychosocial:  Homelessness Unemployment Limited support  PLAN: 1. Continue current regimen 2. Encourage unit groups and activities 3. Monitor mood, behavior and interaction with peers 4.  Motivational enhancement  5. SW would facilitate appropriate  disposition   [place]ist [place], MD 04/11/2017, 4:52 PM

## 2017-04-11 NOTE — Progress Notes (Signed)
D:  Patient's self inventory sheet, patient has fair sleep, no sleep medication given.  Fair appetite, low energy level, poor concentration.  Rated depression, hopeless and anxiety #8.  Withdrawals, chilling, agitation, irritability.  SI, off/on, contracts for safety.  Physical problems, lightheaded, pain, dizziness, headaches.  Physical pain, worst pain in past 24 hours is #7, head and stomach.  Pain medication helpful.  Goal is get home and financially stable.  Plans to do the best she can.  No discharge plans. A:  Medications administered per MD orders.  Emotional support and encouragement given patient. R:  Denied HI.  SI, off/on, contracts for safety.  Denied visual hallucinations.  Stated she does hear voices telling her to do bad things.  Safety maintained with 15 minute checks.

## 2017-04-11 NOTE — Plan of Care (Signed)
Problem: Education: Goal: Utilization of techniques to improve thought processes will improve Outcome: Progressing Nurse discussed anxiety/depression/coping skills with patient.    

## 2017-04-11 NOTE — [facility] Group Notes (Signed)
The focus of this group is to educate the patient on the purpose and policies of crisis stabilization and provide a format to answer questions about their admission.  The group details unit policies and expectations of patients while admitted.  Patient did not attend 0900 nurse education orientation group this morning.  Patient stayed in room.  

## 2017-04-11 NOTE — Progress Notes (Signed)
Recreation Therapy Notes  Animal-Assisted Activity (AAA) Program Checklist/Progress Notes Patient Eligibility Criteria Checklist & Daily Group note for Rec TxIntervention  Date: 05.22.2018 Time: 2:45pm Location: [address]   AAA/T Program Assumption of Risk Form signed by Patient/ or Parent Legal Guardian Yes  Patient is free of allergies or sever asthma Yes  Patient reports no fear of animals Yes  Patient reports no history of cruelty to animals Yes  Patient understands his/her participation is voluntary Yes  Behavioral Response: Did not attend.   Laureen Ochs Oval Cavazos, LRT/CTRS       Rand Etchison L 04/11/2017 3:07 PM

## 2017-04-11 NOTE — Plan of Care (Signed)
Problem: Safety: Goal: Periods of time without injury will increase Outcome: Progressing Periods of time without injury will increase AEB q46min safety checks, fall precautions initiated, client remains safe on the unit.

## 2017-04-11 NOTE — Progress Notes (Signed)
Pt attended the evening AA speaker meeting. Pt was engaged and appropriate. Clint Bolder, NT  04/11/17 9:41 PM

## 2017-04-11 NOTE — Progress Notes (Addendum)
Patient ID: Becky [name], female   DOB: Nov 29, 1960, 56 y.o.   MRN: [id number] D: Client seen on the unit, interacting with peers and watching TV. Client reports of her day "its been good" Contraband found in client room, after staff smelled cigarette smoke on the unit. Room searched, partially smoked cigarette found in a brown bag in clients belongs. A: Writer reviewed treatment agreement with client noting that this is smoke-free, tobacco free facility. Client reports "I know, somebody gave it to me, but I didn't light it or smoke any of it" "I want a cigarette so bad, this patch ain't working" Client urged to consider gum as an alternative.  Body search also completed, no contraband found on body. Staff will monitor q45min for safety. R: Client is safe on the unit. Client notified having contraband on the unit, was reason for discharge noting that she put other clients at risk. Client apologized. Incident reported to T. Olevia Bowens, RN, [facility]

## 2017-04-12 MED ORDER — SERTRALINE HCL 100 MG PO TABS
100.0000 mg | ORAL_TABLET | Freq: Every day | ORAL | Status: DC
  Administered 2017-04-13 – 2017-04-14 (×2): 100 mg via ORAL
  Filled 2017-04-12 (×2): qty 1
  Filled 2017-04-12: qty 7
  Filled 2017-04-12: qty 1

## 2017-04-12 MED ORDER — SERTRALINE HCL 25 MG PO TABS
25.0000 mg | ORAL_TABLET | Freq: Once | ORAL | Status: AC
  Administered 2017-04-12: 25 mg via ORAL
  Filled 2017-04-12: qty 1

## 2017-04-12 MED ORDER — TRAZODONE HCL 50 MG PO TABS
50.0000 mg | ORAL_TABLET | Freq: Every evening | ORAL | Status: DC | PRN
  Administered 2017-04-12 – 2017-04-13 (×2): 50 mg via ORAL
  Filled 2017-04-12 (×8): qty 1

## 2017-04-12 NOTE — [facility] Group Notes (Signed)
[place] LCSW Group Therapy  04/12/2017 3:48 PM  Type of Therapy:  Group Therapy  Participation Level:  Active  Participation Quality:  Attentive  Affect:  Appropriate  Cognitive:  Alert and Oriented  Insight:  Improving  Engagement in Therapy:  Improving  Modes of Intervention:  Confrontation, Discussion, Education, Problem-solving, Socialization and Support  Summary of Progress/Problems: Self Sabotage: Patients were asked to explore the meaning of self sabotage and how this has affected their lives and recovery. Patients were given examples of self sabotage and encouraged to share personal examples of how this impacted their lives, what they learned from that experience, and explore ways to avoid these behaviors in the future. Becky [name] was attentive and engaged during today's processing group. She shared that she allows toxic relationships to impact her life and recovery. She provided positive support for other group members and continues to show progress in the group setting. "I just need to keep the negative people away and tell them to leave me alone."   [employer] LCSW 04/12/2017, 3:48 PM

## 2017-04-12 NOTE — Progress Notes (Signed)
[facility] MD Progress Note  04/12/2017 1:05 PM Becky [name]  MRN:  [id number] Subjective:  56 yo AAF single, homeless, unemployed. Background history of SUD and Depressive disorder. Self presented to the ER. Expressed feelings of worthlessness and hopelessness. Has been using alcohol, cocaine and THC regularly. Expressed command auditory hallucination to end her life. Has been off her medications due to cost.  Chart reviewed today. Patient discussed at team  Staff reports that she has been interacting more with peers. She enjoys to watch TV. Patient was offered contraband by a peer yesterday. She was caught with cigarette. Has not voiced any futility thoughts. Attended some of the groups. Has not been observed to be internally stimulated.  Seen today. Tells me she cannot afford [facility]. Says she left her last job because she did not like it. 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 She [name] a gun under one of the beds. Patient says she is only getting food stamps. She had lived with her daughter in the past. Says she feels embarrassed as she cannot contribute. Tells me that she is still actively looking for a place she can stay. Being homeless and unemployed makes her feel down. Became tearful while talking about her homelessness. Says she has other family stressors. Would not elaborate at his time. Says it looks hopeless at times but she has no intentions of taking her own life. Patient was able to process the effect that would have on her family. She is tolerating her medications well.   Principal Problem: Severe recurrent major depression without psychotic features ([place]) Diagnosis:   Patient Active Problem List   Diagnosis Date Noted  . Severe recurrent major depression without psychotic features ([place]) [F33.2] 04/09/2017  . Chronic hepatitis C without hepatic coma ([place]) [B18.2]   . Alcohol use disorder, severe, dependence ([place]) [F10.20] 01/20/2016  .  Cocaine use disorder, severe, dependence ([place]) [F14.20] 01/20/2016  . 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 Chronic pain syndrome [G89.4] 01/20/2016  . Hepatitis C [B19.20] 01/20/2016  . Prolapsed internal hemorrhoids [K64.8] 06/08/2015  . Constipation [K59.00] 06/08/2015  . Major depressive disorder, recurrent, severe without psychotic features ([place]) [F33.2]    Total Time spent with patient: 20 minutes  Past Psychiatric History: As in H&P  Past Medical History:  Past Medical History:  Diagnosis Date  . Bronchitis   . Cocaine abuse   . Depression   . Headache   . Hypertension   . MDD (major depressive disorder)     Past Surgical History:  Procedure Laterality Date  . COLONOSCOPY WITH PROPOFOL N/A 06/23/2015   SLF: 1. Rectal bleeding/pain due to hemorrhoids2. one large and one small colorectal polyp removed.   . cysts removed from armpits bilaterally    . POLYPECTOMY N/A 06/23/2015   Procedure: POLYPECTOMY;  Surgeon: Danie Binder, MD;  Location: [facility];  Service: Endoscopy;  Laterality: N/A;  descending colon   Family History:  Family History  Problem Relation Age of Onset  . Heart failure Mother   . Coronary artery disease Mother   . Heart failure Father   . Coronary artery disease Father   . Colon cancer Neg Hx   . Mental illness Neg Hx    Family Psychiatric  History: As in H&P Social History:  History  Alcohol Use  . 0.6 oz/week  . 1 Cans of beer per week     History  Drug Use  .  Types: Benzodiazepines, Cocaine, Marijuana    Comment: "anything I can get my hands on except needles"    Social History   Social History  . Marital status: Single    Spouse name: N/A  . Number of children: N/A  . Years of education: N/A   Social History Main Topics  . Smoking status: Current Some Day Smoker    Packs/day: 0.50    Types: Cigarettes  . Smokeless tobacco: Never Used     Comment: vague reports of smoking intermittently   . Alcohol use 0.6  oz/week    1 Cans of beer per week  . Drug use: Yes    Types: Benzodiazepines, Cocaine, Marijuana     Comment: "anything I can get my hands on except needles"  . Sexual activity: Yes   Other Topics Concern  . None   Social History Narrative  . None   Additional Social History:        Sleep: Good  Appetite:  Good  Current Medications: Current Facility-Administered Medications  Medication Dose Route Frequency Provider Last Rate Last Dose  . acetaminophen (TYLENOL) tablet 650 mg  650 mg Oral Q6H PRN Lindon Romp A, NP   650 mg at 04/12/17 0824  . alum & mag hydroxide-simeth (MAALOX/MYLANTA) 200-200-20 MG/5ML suspension 30 mL  30 mL Oral Q4H PRN Lindon Romp A, NP   30 mL at 04/11/17 0755  . ARIPiprazole (ABILIFY) tablet 5 mg  5 mg Oral Daily Cobos, Myer Peer, MD   5 mg at 04/12/17 0825  . feeding supplement (ENSURE ENLIVE) (ENSURE ENLIVE) liquid 237 mL  237 mL Oral Daily PRN Cobos, Fernando A, MD      . hydrALAZINE (APRESOLINE) tablet 10 mg  10 mg Oral Q6H PRN Patriciaann Clan E, PA-C   10 mg at 04/10/17 2157  . ibuprofen (ADVIL,MOTRIN) tablet 600 mg  600 mg Oral Q6H PRN Lindon Romp A, NP      . lisinopril (PRINIVIL,ZESTRIL) tablet 5 mg  5 mg Oral Daily Patriciaann Clan E, PA-C   5 mg at 04/12/17 0825  . magnesium hydroxide (MILK OF MAGNESIA) suspension 30 mL  30 mL Oral Daily PRN Lindon Romp A, NP   30 mL at 04/12/17 0824  . multivitamin with minerals tablet 1 tablet  1 tablet Oral Daily Lindon Romp A, NP   1 tablet at 04/12/17 0825  . nicotine (NICODERM CQ - dosed in mg/24 hours) patch 21 mg  21 mg Transdermal Daily Cobos, Myer Peer, MD   21 mg at 04/12/17 0826  . oxymetazoline (AFRIN) 0.05 % nasal spray 1 spray  1 spray Each Nare BID PRN Lindon Romp A, NP      . pantoprazole (PROTONIX) EC tablet 20 mg  20 mg Oral Daily Cobos, Myer Peer, MD   20 mg at 04/12/17 0826  . sertraline (ZOLOFT) tablet 50 mg  50 mg Oral Daily Cobos, Myer Peer, MD   50 mg at 04/12/17 0825  . thiamine  (VITAMIN B-1) tablet 100 mg  100 mg Oral Daily Lindon Romp A, NP   100 mg at 04/12/17 0825  . traZODone (DESYREL) tablet 50 mg  50 mg Oral QHS PRN [place], Justina A, NP   50 mg at 04/11/17 2239    Lab Results: No results [name] for this or any previous visit (from the past 48 hour(s)).  Blood Alcohol level:  Lab Results  Component Value Date   ETH <5 04/08/2017   ETH 5 (H) 01/18/2016  Metabolic Disorder Labs: Lab Results  Component Value Date   HGBA1C 5.9 (H) 04/09/2017   MPG 123 04/09/2017   Lab Results  Component Value Date   PROLACTIN 20.2 04/09/2017   Lab Results  Component Value Date   CHOL 228 (H) 04/09/2017   TRIG 191 (H) 04/09/2017   HDL 58 04/09/2017   CHOLHDL 3.9 04/09/2017   VLDL 38 04/09/2017   LDLCALC 132 (H) 04/09/2017    Physical Findings: AIMS: Facial and Oral Movements Muscles of Facial Expression: None, normal Lips and Perioral Area: None, normal Jaw: None, normal Tongue: None, normal,Extremity Movements Upper (arms, wrists, hands, fingers): None, normal Lower (legs, knees, ankles, toes): None, normal, Trunk Movements Neck, shoulders, hips: None, normal, Overall Severity Severity of abnormal movements (highest score from questions above): None, normal Incapacitation due to abnormal movements: None, normal Patient's awareness of abnormal movements (rate only patient's report): No Awareness, Dental Status Current problems with teeth and/or dentures?: No Does patient usually wear dentures?: No  CIWA:  CIWA-Ar Total: 0 COWS:  COWS Total Score: 1  Musculoskeletal: Strength & Muscle Tone: within normal limits Gait & Station: normal Patient leans: N/A  Psychiatric Specialty Exam: Physical Exam  Constitutional: [name], place, and time. She appears well-developed and well-nourished.  HENT:  Head: Normocephalic and atraumatic.  Eyes: Conjunctivae are normal. Pupils are equal, round, and reactive to light.  Neck: Normal range  of motion. Neck supple.  Cardiovascular: Normal rate and regular rhythm.   Respiratory: Effort normal and breath sounds normal.  GI: Soft. Bowel sounds are normal.  Musculoskeletal: Normal range of motion.  Neurological: She is alert and oriented to person, place, and time.  Skin: Skin is warm and dry.  Psychiatric:  As above    ROS  Blood pressure 120/78, pulse (!) 56, temperature 98.6 F (37 C), temperature source Oral, resp. rate 16, height 5\' 9"  (1.753 m), weight 78.9 kg (174 lb).Body mass index is 25.7 kg/m.  General Appearance:  Casually dressed. Relatively calmer today. Opened up more today. Appropriate behavior. No EPS   Eye Contact:  Better  Speech:  Soft spoken  Volume:  Low  Mood:  Depressed and Hopeless  Affect:  Blunted and mood congruent  Thought Process:  Linear, slow speed of thought.  Orientation:  Full (Time, Place, and Person)  Thought Content:  Hopeless and worthlessness. No hallucination in any modality. No thoughts of violence.   Suicidal Thoughts:  None  Homicidal Thoughts:  No  Memory:  Did not assess at this time  Judgement:  Fair  Insight:  Partial as she is not considering addiction treatment at this time.   Psychomotor Activity:  Decreased  Concentration:  Limited  Recall:  Did not assess  Fund of Knowledge:  Fair  Language:  Good  Akathisia:  Negative  Handed:    AIMS (if indicated):     Assets:  Physical Health  ADL's:  Intact  Cognition:  WNL  Sleep:  Number of Hours: 6.5     Treatment Plan Summary: Patient is still pervasively depressed. We plan to adjust her medications further today. Needs further evaluation.   Psychiatric: SUD Substance Induced Mood Disiorder Persistent Depressive Disorder  Medical: HTN HCV Chronic pain Bronchitis  Psychosocial:  Homelessness Unemployment Limited support  PLAN: 1. Increase Sertraline to 75 mg today and 100 mg daily from tomorrow 2. Encourage unit groups and activities 3. Continue to  monitor mood, behavior and interaction with peers 4. Continue to motivate patient  towards addiction treatment 5. SW would facilitate appropriate disposition   Artist Beach, MD 04/12/2017, 1:05 PMPatient ID: Becky [name], female   DOB: 03-31-1961, 55 y.o.   MRN: [id number]

## 2017-04-12 NOTE — Progress Notes (Addendum)
  DATA ACTION RESPONSE  Objective- Pt. is visible in the dayroom, seen interacting with peers.  Presents with an animated    affect and mood. No further c/o. No abnormal s/s. BP/HR elevated this evening. See MAR. Will monitor.  Subjective- Denies having any SI/HI/VH at this time. Rates pain 7/10; headache. Rates pain +AH stating "I hear depressed things; telling me to end it all". Pt. states " I just have to work on this relationship with my boyfriend". Is cooperative and remain safe on the unit.  1:1 interaction in private to establish rapport. Encouragement, education, & support given from staff.  PRN Trazodone with repeat and Ibuprofen requested and will re-eval accordingly.   Safety maintained with Q 15 checks. Continue with POC.

## 2017-04-12 NOTE — Progress Notes (Signed)
DAR NOTE: Pt present with flat affect and depressed mood in the unit. Pt has been in the dayroom with peers. Pt complained of headache and indigestion, took all he  meds as scheduled. As per self inventory, pt had a good night sleep, good appetite, normal energy, and good concentration. Pt rate depression at 7, hopeless ness at 9, and anxiety at 5. Pt's safety ensured with 15 minute and environmental checks. Pt currently denies SI/HI and A/V hallucinations. Pt verbally agrees to seek staff if SI/HI or A/VH occurs and to consult with staff before acting on these thoughts. Will continue POC.

## 2017-04-12 NOTE — Plan of Care (Addendum)
Problem: Safety: Goal: Periods of time without injury will increase Outcome: Progressing Pt. remains a moderate falls risk, denies SI/HI/VH at this time, +AH stating "I hear depressed things; telling me to end it all", Q 15 checks in effect

## 2017-04-12 NOTE — Progress Notes (Signed)
Recreation Therapy Notes  Date: 04/12/17 Time: 0925 Location: 300 Hall Dayroom  Group Topic: Stress Management  Goal Area(s) Addresses:  Patient will verbalize importance of using healthy stress management.  Patient will identify positive emotions associated with healthy stress management.   Intervention: Stress Management  Activity :  Progressive Muscle Relaxation.  LRT introduced the stress management technique of progressive muscle relaxation.  LRT read a script to guide patients through the transitions to relax each muscle individually.  Patients were to follow along as LRT read script to engage in activity.  Education:  Stress Management, Discharge Planning.   Education Outcome: Acknowledges edcuation/In group clarification offered/Needs additional education  Clinical Observations/Feedback: Pt did not attend group.   Victorino Sparrow, LRT/CTRS         Victorino Sparrow A 04/12/2017 11:44 AM

## 2017-04-12 NOTE — Plan of Care (Signed)
Problem: Activity: Goal: Interest or engagement in leisure activities will improve Outcome: Progressing Pt has been observed interacting with peers in the hall way.

## 2017-04-13 NOTE — [facility] Suicide Risk Assessment (Signed)
[facility] INPATIENT:  Family/Significant Other Suicide Prevention Education  Suicide Prevention Education:  Contact Attempts: Dillard Cannon (pt's sister) [phone] has been identified by the patient as the family member/significant other with whom the patient will be residing, and identified as the person(s) who will aid the patient in the event of a mental health crisis.  With written consent from the patient, two attempts were made to provide suicide prevention education, prior to and/or following the patient's discharge.  We were unsuccessful in providing suicide prevention education.  A suicide education pamphlet was given to the patient to share with family/significant other.  Date and time of first attempt: 11:48AM on 04/13/17. Unable to leave voicemail.   Maily Debarge N Smart LCSW 04/13/2017, 11:47 AM

## 2017-04-13 NOTE — Progress Notes (Signed)
Pt did attend the evening karaoke group. Pt was engaged, supportive, and participated by singing a song.

## 2017-04-13 NOTE — Progress Notes (Signed)
Pt attended N/A group for night group. Morna Flud/MHT

## 2017-04-13 NOTE — [facility] Group Notes (Signed)
The focus of this group is to educate the patient on the purpose and policies of crisis stabilization and provide a format to answer questions about their admission.  The group details unit policies and expectations of patients while admitted.  Patient attended 0900 nurse education orientation group this morning.  Patient actively participated and had appropriate affect.  Patient alert.  Patient had appropriate insight and appropriate engagement.  Today patient will work on 3 goals for discharge.

## 2017-04-13 NOTE — Progress Notes (Signed)
[facility] MD Progress Note  04/13/2017 2:26 PM Becky [name]  MRN:  [id number] Subjective:  56 yo AAF single, homeless, unemployed. Background history of SUD and Depressive disorder. Self presented to the ER. Expressed feelings of worthlessness and hopelessness. Has been using alcohol, cocaine and THC regularly. Expressed command auditory hallucination to end her life. Has been off her medications due to cost.  Chart reviewed today. Patient discussed at team  Staff reports that she is interacting more with peers. Has been attending groups and unit activities. Has not been observed to be withdrawn. Has not voiced any futility thoughts. I explored reports about negative voices yesterday. Says it was her thoughts while processing a previous relationship.  I observed her socializing this morning with peers. She was very excited and interacted normally. At interview, she tells me that she is feeling well again. Says she feels her spirits has lifted. She is pleased her medications were adjusted. Says she now has normal energy. She is no longer crying. She spoke with her daughter and plans to stay with her daughter. Says she wants to reengage with NA. She plans to get a sponsor this time. Says her plans it take herself away from the people, places and things that makes her relapse. No suicidal thoughts. Hopes she would find a job soon. Says she has made new friends here.   Principal Problem: Severe recurrent major depression without psychotic features ([place]) Diagnosis:   Patient Active Problem List   Diagnosis Date Noted  . Severe recurrent major depression without psychotic features ([place]) [F33.2] 04/09/2017  . Chronic hepatitis C without hepatic coma ([place]) [B18.2]   . Alcohol use disorder, severe, dependence ([place]) [F10.20] 01/20/2016  . Cocaine use disorder, severe, dependence ([place]) [F14.20] 01/20/2016  . 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 Chronic pain syndrome [G89.4] 01/20/2016  .  Hepatitis C [B19.20] 01/20/2016  . Prolapsed internal hemorrhoids [K64.8] 06/08/2015  . Constipation [K59.00] 06/08/2015  . Major depressive disorder, recurrent, severe without psychotic features ([place]) [F33.2]    Total Time spent with patient: 20 minutes  Past Psychiatric History: As in H&P  Past Medical History:  Past Medical History:  Diagnosis Date  . Bronchitis   . Cocaine abuse   . Depression   . Headache   . Hypertension   . MDD (major depressive disorder)     Past Surgical History:  Procedure Laterality Date  . COLONOSCOPY WITH PROPOFOL N/A 06/23/2015   SLF: 1. Rectal bleeding/pain due to hemorrhoids2. one large and one small colorectal polyp removed.   . cysts removed from armpits bilaterally    . POLYPECTOMY N/A 06/23/2015   Procedure: POLYPECTOMY;  Surgeon: Danie Binder, MD;  Location: [facility];  Service: Endoscopy;  Laterality: N/A;  descending colon   Family History:  Family History  Problem Relation Age of Onset  . Heart failure Mother   . Coronary artery disease Mother   . Heart failure Father   . Coronary artery disease Father   . Colon cancer Neg Hx   . Mental illness Neg Hx    Family Psychiatric  History: As in H&P Social History:  History  Alcohol Use  . 0.6 oz/week  . 1 Cans of beer per week     History  Drug Use  . Types: Benzodiazepines, Cocaine, Marijuana    Comment: "anything I can get my hands on except needles"    Social History   Social History  . Marital status: Single    Spouse  name: N/A  . Number of children: N/A  . Years of education: N/A   Social History Main Topics  . Smoking status: Current Some Day Smoker    Packs/day: 0.50    Types: Cigarettes  . Smokeless tobacco: Never Used     Comment: vague reports of smoking intermittently   . Alcohol use 0.6 oz/week    1 Cans of beer per week  . Drug use: Yes    Types: Benzodiazepines, Cocaine, Marijuana     Comment: "anything I can get my hands on except needles"  . Sexual  activity: Yes   Other Topics Concern  . None   Social History Narrative  . None   Additional Social History:        Sleep: Good  Appetite:  Good  Current Medications: Current Facility-Administered Medications  Medication Dose Route Frequency Provider Last Rate Last Dose  . acetaminophen (TYLENOL) tablet 650 mg  650 mg Oral Q6H PRN Lindon Romp A, NP   650 mg at 04/12/17 0824  . alum & mag hydroxide-simeth (MAALOX/MYLANTA) 200-200-20 MG/5ML suspension 30 mL  30 mL Oral Q4H PRN Lindon Romp A, NP   30 mL at 04/12/17 1328  . ARIPiprazole (ABILIFY) tablet 5 mg  5 mg Oral Daily Cobos, Myer Peer, MD   5 mg at 04/13/17 0824  . feeding supplement (ENSURE ENLIVE) (ENSURE ENLIVE) liquid 237 mL  237 mL Oral Daily PRN Cobos, Fernando A, MD      . hydrALAZINE (APRESOLINE) tablet 10 mg  10 mg Oral Q6H PRN Patriciaann Clan E, PA-C   10 mg at 04/10/17 2157  . ibuprofen (ADVIL,MOTRIN) tablet 600 mg  600 mg Oral Q6H PRN Lindon Romp A, NP   600 mg at 04/13/17 0829  . lisinopril (PRINIVIL,ZESTRIL) tablet 5 mg  5 mg Oral Daily Laverle Hobby, PA-C   5 mg at 04/13/17 8502  . magnesium hydroxide (MILK OF MAGNESIA) suspension 30 mL  30 mL Oral Daily PRN Lindon Romp A, NP   30 mL at 04/12/17 0824  . multivitamin with minerals tablet 1 tablet  1 tablet Oral Daily Lindon Romp A, NP   1 tablet at 04/13/17 7741  . nicotine (NICODERM CQ - dosed in mg/24 hours) patch 21 mg  21 mg Transdermal Daily Cobos, Myer Peer, MD   21 mg at 04/13/17 2878  . oxymetazoline (AFRIN) 0.05 % nasal spray 1 spray  1 spray Each Nare BID PRN Lindon Romp A, NP      . pantoprazole (PROTONIX) EC tablet 20 mg  20 mg Oral Daily Cobos, Myer Peer, MD   20 mg at 04/13/17 6767  . sertraline (ZOLOFT) tablet 100 mg  100 mg Oral Daily Krystal Teachey, Laruth Bouchard, MD   100 mg at 04/13/17 2094  . thiamine (VITAMIN B-1) tablet 100 mg  100 mg Oral Daily Lindon Romp A, NP   100 mg at 04/13/17 7096  . traZODone (DESYREL) tablet 50 mg  50 mg Oral QHS,MR  X 1 Laverle Hobby, PA-C   50 mg at 04/12/17 2249    Lab Results: No results [name] for this or any previous visit (from the past 48 hour(s)).  Blood Alcohol level:  Lab Results  Component Value Date   ETH <5 04/08/2017   ETH 5 (H) 28/36/6294    Metabolic Disorder Labs: Lab Results  Component Value Date   HGBA1C 5.9 (H) 04/09/2017   MPG 123 04/09/2017   Lab Results  Component Value Date  PROLACTIN 20.2 04/09/2017   Lab Results  Component Value Date   CHOL 228 (H) 04/09/2017   TRIG 191 (H) 04/09/2017   HDL 58 04/09/2017   CHOLHDL 3.9 04/09/2017   VLDL 38 04/09/2017   LDLCALC 132 (H) 04/09/2017    Physical Findings: AIMS: Facial and Oral Movements Muscles of Facial Expression: None, normal Lips and Perioral Area: None, normal Jaw: None, normal Tongue: None, normal,Extremity Movements Upper (arms, wrists, hands, fingers): None, normal Lower (legs, knees, ankles, toes): None, normal, Trunk Movements Neck, shoulders, hips: None, normal, Overall Severity Severity of abnormal movements (highest score from questions above): None, normal Incapacitation due to abnormal movements: None, normal Patient's awareness of abnormal movements (rate only patient's report): No Awareness, Dental Status Current problems with teeth and/or dentures?: No Does patient usually wear dentures?: No  CIWA:  CIWA-Ar Total: 4 COWS:  COWS Total Score: 1  Musculoskeletal: Strength & Muscle Tone: within normal limits Gait & Station: normal Patient leans: N/A  Psychiatric Specialty Exam: Physical Exam  Constitutional: [name], place, and time. She appears well-developed and well-nourished.  HENT:  Head: Normocephalic and atraumatic.  Eyes: Conjunctivae are normal. Pupils are equal, round, and reactive to light.  Neck: Normal range of motion. Neck supple.  Cardiovascular: Normal rate and regular rhythm.   Respiratory: Effort normal and breath sounds normal.  GI: Soft.  Bowel sounds are normal.  Musculoskeletal: Normal range of motion.  Neurological: She is alert and oriented to person, place, and time.  Skin: Skin is warm and dry.  Psychiatric:  As above    ROS  Blood pressure (!) 132/92, pulse 62, temperature 98 F (36.7 C), temperature source Oral, resp. rate 16, height 5\' 9"  (1.753 m), weight 78.9 kg (174 lb).Body mass index is 25.7 kg/m.  General Appearance:  Casually dressed. Relatively calmer today. Opened up more today. Appropriate behavior. No EPS   Eye Contact:  Better  Speech:  Soft spoken  Volume:  Low  Mood:  Depressed and Hopeless  Affect:  Blunted and mood congruent  Thought Process:  Linear, slow speed of thought.  Orientation:  Full (Time, Place, and Person)  Thought Content:  Hopeless and worthlessness. No hallucination in any modality. No thoughts of violence.   Suicidal Thoughts:  None  Homicidal Thoughts:  No  Memory:  Did not assess at this time  Judgement:  Fair  Insight:  Partial as she is not considering addiction treatment at this time.   Psychomotor Activity:  Decreased  Concentration:  Limited  Recall:  Did not assess  Fund of Knowledge:  Fair  Language:  Good  Akathisia:  Negative  Handed:    AIMS (if indicated):     Assets:  Physical Health  ADL's:  Intact  Cognition:  WNL  Sleep:  Number of Hours: 6.5     Treatment Plan Summary: Depression has lifted. She is tolerating her medications well. No dangerousness. Seems motivated to stay sober. Hopeful discharge tomorrow.   Psychiatric: SUD Substance Induced Mood Disiorder Persistent Depressive Disorder  Medical: HTN HCV Chronic pain Bronchitis  Psychosocial:  Homelessness Unemployment Limited support  PLAN: 1. Continue current regimen 2. Continue to monitor mood, behavior and interaction with peers 3. Home tomorrow.   Artist Beach, MD 04/13/2017, 2:26 PMPatient ID: Becky [name], female   DOB: 1961-06-19, 56 y.o.   MRN:  [id number] Patient ID: Becky [name], female   DOB: February 14, 1961, 56 y.o.   MRN: [id number]

## 2017-04-13 NOTE — Plan of Care (Signed)
Problem: Activity: Goal: Interest or engagement in activities will improve Outcome: Progressing Pt. attended karaoke this evening.

## 2017-04-13 NOTE — BHH Group Notes (Signed)
[place] LCSW Group Therapy  04/13/2017 1:15 PM  Type of Therapy:  Group Therapy  Participation Level:  Active  Participation Quality:  Attentive  Affect:  Appropriate  Cognitive:  Alert and Oriented  Insight:  Improving  Engagement in Therapy:  Engaged  Modes of Intervention:  Confrontation, Discussion, Education, Problem-solving, Socialization and Support  Summary of Progress/Problems: Feelings Around Diagnosis. Patients were asked to explore feelings associated with being diagnosed with a mental illness/substance abuse disorder. Group member were asked to identify support network and explore their understanding of their specific diagnosis, whether they agree with it, and how they can use this information to assist in recovery. Becky [name] was attentive and engaged. She shared that she is feeling much better now that she is taking medication, attending groups, and understands her diagnosis. "I'm going home tomorrow and am really excited to be leaving." Aftercare was discussed with patient and she asked for NA list for [place]--in her chart. [name] continues to show improving insight.   Lun Muro N Smart LCSW 04/13/2017, 1:15 PM

## 2017-04-13 NOTE — Plan of Care (Signed)
Problem: Coping: Goal: Ability to cope will improve Outcome: Progressing Nurse discussed depression/anxiety/coping skills with patient.    

## 2017-04-13 NOTE — Progress Notes (Signed)
D:  Patient's self inventory sheet, patient has fair sleep, sleep medication helpful.  Fair appetite, low energy level, good concentration.  Rated depression, hopeless and anxiety #5.  Denied withdrawals.  Denied SI.  Physical problems, backache and head.  Worst pain #8 in past 24 hours.  Pain medication helpful.  Goal is going home and to N/A meeting.  Plans to go to N/A meeting.  "Thank you for everything."  Does have discharge plans. A:  Medications administered per MD orders.  Emotional support and encouragement given patient. R:  Denied SI and HI, contracts for safety.  Denied A/V hallucinations.  Safety maintained with 15 minute checks. Patient may be discharged on 04/13/2017.

## 2017-04-14 MED ORDER — SERTRALINE HCL 100 MG PO TABS: 100.0000 mg | ORAL_TABLET | Freq: Every day | ORAL | 0 refills | Status: DC

## 2017-04-14 MED ORDER — PANTOPRAZOLE SODIUM 20 MG PO TBEC: 20.0000 mg | DELAYED_RELEASE_TABLET | Freq: Every day | ORAL | 0 refills | Status: DC

## 2017-04-14 MED ORDER — LISINOPRIL 5 MG PO TABS: 5.0000 mg | ORAL_TABLET | Freq: Every day | ORAL | 0 refills | Status: DC

## 2017-04-14 MED ORDER — NICOTINE 21 MG/24HR TD PT24: 21.0000 mg | MEDICATED_PATCH | Freq: Every day | TRANSDERMAL | 0 refills | Status: DC

## 2017-04-14 MED ORDER — ARIPIPRAZOLE 5 MG PO TABS: 5.0000 mg | ORAL_TABLET | Freq: Every day | ORAL | 0 refills | Status: DC

## 2017-04-14 MED ORDER — TRAZODONE HCL 100 MG PO TABS: ORAL_TABLET | ORAL | 0 refills | Status: DC

## 2017-04-14 MED ORDER — IBUPROFEN 600 MG PO TABS: 600.0000 mg | ORAL_TABLET | Freq: Four times a day (QID) | ORAL | 0 refills | Status: DC | PRN

## 2017-04-14 MED ORDER — OXYMETAZOLINE HCL 0.05 % NA SOLN: 1.0000 | Freq: Two times a day (BID) | NASAL | 0 refills | Status: DC | PRN

## 2017-04-14 MED ORDER — TRAZODONE HCL 100 MG PO TABS
100.0000 mg | ORAL_TABLET | Freq: Every day | ORAL | Status: DC
  Filled 2017-04-14: qty 7

## 2017-04-14 NOTE — BHH Suicide Risk Assessment (Signed)
[facility] Discharge Suicide Risk Assessment   Principal Problem: Severe recurrent major depression without psychotic features [facility]) Discharge Diagnoses:  Patient Active Problem List   Diagnosis Date Noted  . Severe recurrent major depression without psychotic features ([place]) [F33.2] 04/09/2017  . Chronic hepatitis C without hepatic coma ([place]) [B18.2]   . Alcohol use disorder, severe, dependence ([place]) [F10.20] 01/20/2016  . Cocaine use disorder, severe, dependence ([place]) [F14.20] 01/20/2016  . 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 Chronic pain syndrome [G89.4] 01/20/2016  . Hepatitis C [B19.20] 01/20/2016  . Prolapsed internal hemorrhoids [K64.8] 06/08/2015  . Constipation [K59.00] 06/08/2015  . Major depressive disorder, recurrent, severe without psychotic features ([place]) [F33.2]     Total Time spent with patient: 45 minutes  Musculoskeletal: Strength & Muscle Tone: within normal limits Gait & Station: normal Patient leans: N/A  Psychiatric Specialty Exam: Review of Systems  Constitutional: Negative.   HENT: Negative.   Eyes: Negative.   Respiratory: Negative.   Cardiovascular: Negative.   Gastrointestinal: Negative.   Genitourinary: Negative.   Musculoskeletal: Negative.   Skin: Negative.   Neurological: Negative.   Endo/Heme/Allergies: Negative.   Psychiatric/Behavioral: Negative for depression, hallucinations, memory loss, substance abuse and suicidal ideas. The patient is not nervous/anxious and does not have insomnia.     Blood pressure (!) 146/87, pulse 80, temperature 98.4 F (36.9 C), temperature source Oral, resp. rate 18, height 5\' 9"  (1.753 m), weight 78.9 kg (174 lb).Body mass index is 25.7 kg/m.  General Appearance: Neatly dressed, pleasant, engaging well and cooperative. Appropriate behavior. Not in any distress. Good relatedness. Not internally stimulated  Eye Contact::  Good  Speech:  Spontaneous, normal prosody. Normal tone and rate.   Volume:   Normal  Mood:  Euthymic  Affect:  Appropriate and Full Range  Thought Process:  Goal Directed and Linear  Orientation:  Full (Time, Place, and Person)  Thought Content:  Future oriented. No delusional theme. No preoccupation with violent thoughts. No negative ruminations. No obsession.  No hallucination in any modality.    Suicidal Thoughts:  No  Homicidal Thoughts:  No  Memory:  Immediate;   Good Recent;   Good Remote;   Good  Judgement:  Good  Insight:  Good  Psychomotor Activity:  Normal  Concentration:  Good  Recall:  Good  Fund of Knowledge:Good  Language: Good  Akathisia:  No  Handed:    AIMS (if indicated):     Assets:  Communication Skills Desire for Improvement Resilience Social Support  Sleep:  Number of Hours: 5.5  Cognition: WNL  ADL's:  Intact   Clinical  Assessment::   56 yo AAF single, homeless, unemployed. Background history of SUD and Depressive disorder. Self presented to the ER. Expressed feelings of worthlessness and hopelessness. Has been using alcohol, cocaine and THC regularly. Expressed command auditory hallucination to end her life. Has been off her medications due to cost.  Seen today. Feeling good again. Reports her mood has lifted. She is no longer having headaches. She has normal biological functions. She is thinking clearly. She is looking forward to spend time with her boyfriend/husband. No thoughts of suicide. Future oriented. No thoughts of violence. No thoughts of homicide. No evidence of anxiety. I observed her dancing at the hallway yesterday evening.  Nursing staff reports that patient has been appropriate on the unit. Patient has been interacting well with peers. No behavioral issues. Patient has not voiced any suicidal thoughts. Patient has not been observed to be internally stimulated. Patient  has been adherent with treatment recommendations. Patient has been tolerating their medication well.   Patient was discussed at team. Team members  feels that patient is back to her baseline level of function. Team agrees with plan to discharge patient today.  Demographic Factors:  Low socioeconomic status  Loss Factors: NA  Historical Factors: Impulsivity  Risk Reduction Factors:   Sense of responsibility to family, Positive social support, Positive therapeutic relationship and Positive coping skills or problem solving skills  Continued Clinical Symptoms:  As above  Cognitive Features That Contribute To Risk:  None    Suicide Risk:  Minimal: No identifiable suicidal ideation.  Patient is not having any thoughts of suicide at this time. Modifiable risk factors targeted during this admission includes depression and substance use. Demographical and historical risk factors cannot be modified. Patient is now engaging well. Patient is reliable and is future oriented. We have buffered patient's support structures. At this point, patient is at low risk of suicide. Patient is aware of the effects of psychoactive substances on decision making process. Patient has been provided with emergency contacts. Patient acknowledges to use resources provided if unforseen circumstances changes their current risk stratification.    [place] Follow up on 04/18/2017.   Why:  Walk in Tuesday between 12pm-2:30PM to be seen for hospital follow-up. Please bring Photo ID to this first appt.  Thank you.  Contact information: [address] [place] [address] [phone]           Plan Of Care/Follow-up recommendations:  1. Continue current psychotropic medications 2. Mental health and addiction follow up as arranged.  3. Provided limited quantity of prescriptions   Artist Beach, MD 04/14/2017, 10:08 AM

## 2017-04-14 NOTE — Progress Notes (Signed)
  DATA ACTION RESPONSE  Objective- Pt. is visible in the dayroom, seen interacting with peers loudly. Presents with an animated  affect and mood. No further c/o. No abnormal s/s aside from ongoing headache. BP/HR elevated this evening.  Subjective- Denies having any SI/HI/AVH at this time. Rates pain 8/10; headache.  Pt. states " I am going home tomorrow; I'm ready". Is cooperative and remain safe on the unit.  1:1 interaction in private to establish rapport. Encouragement, education, & support given from staff.  PRN Ibuprofen and Milk of Mag requested and will re-eval accordingly.   Safety maintained with Q 15 checks. Continue with POC.

## 2017-04-14 NOTE — Progress Notes (Signed)
Recreation Therapy Notes  Date: 04/14/17 Time: 0930 Location: 300 Hall Dayroom  Group Topic: Stress Management  Goal Area(s) Addresses:  Patient will verbalize importance of using healthy stress management.  Patient will identify positive emotions associated with healthy stress management.   Behavioral Response: Engaged  Intervention: Stress Management  Activity :  Gratitude Meditation.  LRT introduced the stress management technique of meditation to patients.  LRT played a meditation from the Calm app to allow patients to engage in the technique.  Patients were to follow along as the meditation played to participate in the practice.  Education:  Stress Management, Discharge Planning.   Education Outcome: Acknowledges edcuation/In group clarification offered/Needs additional education  Clinical Observations/Feedback: Pt attended group.   Victorino Sparrow, LRT/CTRS         Victorino Sparrow A 04/14/2017 11:14 AM

## 2017-04-14 NOTE — Tx Team (Signed)
Interdisciplinary Treatment and Diagnostic Plan Update  04/14/2017 Time of Session: [place] MRN: [id number]  Principal Diagnosis: Severe recurrent major depression without psychotic features [facility])  Secondary Diagnoses: Principal Problem:   Severe recurrent major depression without psychotic features ([place])   Current Medications:  Current Facility-Administered Medications  Medication Dose Route Frequency Provider Last Rate Last Dose  . acetaminophen (TYLENOL) tablet 650 mg  650 mg Oral Q6H PRN Lindon Romp A, NP   650 mg at 04/12/17 0824  . alum & mag hydroxide-simeth (MAALOX/MYLANTA) 200-200-20 MG/5ML suspension 30 mL  30 mL Oral Q4H PRN Lindon Romp A, NP   30 mL at 04/12/17 1328  . ARIPiprazole (ABILIFY) tablet 5 mg  5 mg Oral Daily Cobos, Myer Peer, MD   5 mg at 04/14/17 0758  . feeding supplement (ENSURE ENLIVE) (ENSURE ENLIVE) liquid 237 mL  237 mL Oral Daily PRN Cobos, Fernando A, MD      . hydrALAZINE (APRESOLINE) tablet 10 mg  10 mg Oral Q6H PRN Patriciaann Clan E, PA-C   10 mg at 04/10/17 2157  . ibuprofen (ADVIL,MOTRIN) tablet 600 mg  600 mg Oral Q6H PRN Lindon Romp A, NP   600 mg at 04/13/17 2134  . lisinopril (PRINIVIL,ZESTRIL) tablet 5 mg  5 mg Oral Daily Laverle Hobby, PA-C   5 mg at 04/14/17 0758  . magnesium hydroxide (MILK OF MAGNESIA) suspension 30 mL  30 mL Oral Daily PRN Lindon Romp A, NP   30 mL at 04/13/17 2135  . multivitamin with minerals tablet 1 tablet  1 tablet Oral Daily Lindon Romp A, NP   1 tablet at 04/14/17 0759  . nicotine (NICODERM CQ - dosed in mg/24 hours) patch 21 mg  21 mg Transdermal Daily Cobos, Myer Peer, MD   21 mg at 04/14/17 0759  . oxymetazoline (AFRIN) 0.05 % nasal spray 1 spray  1 spray Each Nare BID PRN Lindon Romp A, NP      . pantoprazole (PROTONIX) EC tablet 20 mg  20 mg Oral Daily Cobos, Myer Peer, MD   20 mg at 04/14/17 0759  . sertraline (ZOLOFT) tablet 100 mg  100 mg Oral Daily Izediuno, Laruth Bouchard, MD   100 mg at  04/14/17 0759  . thiamine (VITAMIN B-1) tablet 100 mg  100 mg Oral Daily Lindon Romp A, NP   100 mg at 04/14/17 0759  . traZODone (DESYREL) tablet 50 mg  50 mg Oral QHS,MR X 1 Laverle Hobby, PA-C   50 mg at 04/13/17 2134   PTA Medications: Prescriptions Prior to Admission  Medication Sig Dispense Refill Last Dose  . diclofenac (VOLTAREN) 50 MG EC tablet Take 1 tablet (50 mg total) by mouth 2 (two) times daily. (Patient not taking: Reported on 04/08/2017) 14 tablet 0 Not Taking at Unknown time  . diphenhydramine-acetaminophen (TYLENOL PM) 25-500 MG TABS tablet Take 3 tablets by mouth at bedtime.   04/07/2017 at Unknown time  . DULoxetine (CYMBALTA) 30 MG capsule Take 1 capsule (30 mg total) by mouth 2 (two) times daily. (Patient not taking: Reported on 04/08/2017) 60 capsule 0 Not Taking at Unknown time  . ibuprofen (ADVIL,MOTRIN) 200 MG tablet Take 1,000 mg by mouth every 6 (six) hours as needed for headache or moderate pain.   unknown  . lisinopril (PRINIVIL,ZESTRIL) 10 MG tablet Take 1 tablet (10 mg total) by mouth daily. (Patient not taking: Reported on 04/08/2017) 30 tablet 0 Not Taking at Unknown time  . oxymetazoline (AFRIN) 0.05 %  nasal spray Place 1 spray into both nostrils 2 (two) times daily as needed for congestion.   Past Week at Unknown time  . phenazopyridine (PYRIDIUM) 200 MG tablet Take 1 tablet (200 mg total) by mouth 3 (three) times daily. (Patient not taking: Reported on 04/08/2017) 6 tablet 0 Not Taking at Unknown time    Patient Stressors: Financial difficulties Loss of Relationship, 55 year old daughter living with sister Marital or family conflict Medication change or noncompliance Substance abuse  Patient Strengths: Active sense of humor Average or above average intelligence Motivation for treatment/growth  Treatment Modalities: Medication Management, Group therapy, Case management,  1 to 1 session with clinician, Psychoeducation, Recreational therapy.   Physician  Treatment Plan for Primary Diagnosis: Severe recurrent major depression without psychotic features ([place]) Long Term Goal(s): Improvement in symptoms so as ready for discharge Improvement in symptoms so as ready for discharge   Short Term Goals: Ability to identify changes in lifestyle to reduce recurrence of condition will improve Ability to verbalize feelings will improve Ability to disclose and discuss suicidal ideas Ability to demonstrate self-control will improve Ability to disclose and discuss suicidal ideas Ability to demonstrate self-control will improve Ability to maintain clinical measurements within normal limits will improve  Medication Management: Evaluate patient's response, side effects, and tolerance of medication regimen.  Therapeutic Interventions: 1 to 1 sessions, Unit Group sessions and Medication administration.  Evaluation of Outcomes: Met  Physician Treatment Plan for Secondary Diagnosis: Principal Problem:   Severe recurrent major depression without psychotic features ([place])  Long Term Goal(s): Improvement in symptoms so as ready for discharge Improvement in symptoms so as ready for discharge   Short Term Goals: Ability to identify changes in lifestyle to reduce recurrence of condition will improve Ability to verbalize feelings will improve Ability to disclose and discuss suicidal ideas Ability to demonstrate self-control will improve Ability to disclose and discuss suicidal ideas Ability to demonstrate self-control will improve Ability to maintain clinical measurements within normal limits will improve     Medication Management: Evaluate patient's response, side effects, and tolerance of medication regimen.  Therapeutic Interventions: 1 to 1 sessions, Unit Group sessions and Medication administration.  Evaluation of Outcomes: Met  RN Treatment Plan for Primary Diagnosis: Severe recurrent major depression without psychotic features ([place]) Long Term Goal(s):  Knowledge of disease and therapeutic regimen to maintain health will improve  Short Term Goals: Ability to remain free from injury will improve, Ability to verbalize feelings will improve and Ability to disclose and discuss suicidal ideas  Medication Management: RN will administer medications as ordered by provider, will assess and evaluate patient's response and provide education to patient for prescribed medication. RN will report any adverse and/or side effects to prescribing provider.  Therapeutic Interventions: 1 on 1 counseling sessions, Psychoeducation, Medication administration, Evaluate responses to treatment, Monitor vital signs and CBGs as ordered, Perform/monitor CIWA, COWS, AIMS and Fall Risk screenings as ordered, Perform wound care treatments as ordered.  Evaluation of Outcomes: Met  LCSW Treatment Plan for Primary Diagnosis: Severe recurrent major depression without psychotic features ([place]) Long Term Goal(s): Safe transition to appropriate next level of care at discharge, Engage patient in therapeutic group addressing interpersonal concerns.  Short Term Goals: Engage patient in aftercare planning with referrals and resources, Facilitate patient progression through stages of change regarding substance use diagnoses and concerns and Identify triggers associated with mental health/substance abuse issues  Therapeutic Interventions: Assess for all discharge needs, 1 to 1 time with Social worker, Explore available  resources and support systems, Assess for adequacy in community support network, Educate family and significant other(s) on suicide prevention, Complete Psychosocial Assessment, Interpersonal group therapy.  Evaluation of Outcomes: Met  Progress in Treatment: Attending groups: Yes. Participating in groups: Yes. Taking medication as prescribed: Yes. Toleration medication: Yes. Family/Significant other contact made: Contact attempts made with pt's sister. SPE completed with  pt.  Patient understands diagnosis: Yes. Discussing patient identified problems/goals with staff: Yes. Medical problems stabilized or resolved: Yes. Denies suicidal/homicidal ideation: Yes. Issues/concerns per patient self-inventory: No. Other: n/a   New problem(s) identified: No, Describe:  n/a  New Short Term/Long Term Goal(s): medication stabilization; detox; elimination of SI thoughts; development of comprehensive mental wellness/sobriety plan.   Discharge Plan or Barriers: Pt plans to live with her daughter until she can save money for admission to [facility] house-follow-up at [facility]. Pt also given list of NA meetings for [facility] per her request and [place] [facility] [employer].   Reason for Continuation of Hospitalization: none  Estimated Length of Stay: discharge today   Attendees: Patient: 04/14/2017 8:54 AM  Physician: Dr. Sanjuana Letters MD 04/14/2017 8:54 AM  Nursing: Babette Relic RN 04/14/2017 8:54 AM  RN Care Manager: Lars Pinks CM 04/14/2017 8:54 AM  Social Worker: Maxie Better, LCSW; Matthew Saras LCSW 04/14/2017 8:54 AM  Recreational Therapist: x 04/14/2017 8:54 AM  Other: Lindell Spar NP; Samuel Jester NP 04/14/2017 8:54 AM  Other:  04/14/2017 8:54 AM  Other: 04/14/2017 8:54 AM    Scribe for Treatment Team: Wisdom, LCSW 04/14/2017 8:54 AM

## 2017-04-14 NOTE — Progress Notes (Signed)
  [facility] Adult Case Management Discharge Plan :  Will you be returning to the same living situation after discharge:  Yes,  with daughter At discharge, do you have transportation home?: Yes,  friend Do you have the ability to pay for your medications: Yes,  no insurance/mental health  Release of information consent forms completed and submitted to medical records by CSW.   Patient to Follow up at: [facility] Follow up on 04/18/2017.   Why:  Walk in Tuesday between 12pm-2:30PM to be seen for hospital follow-up. Please bring Photo ID to this first appt.  Thank you.  Contact information: [address] [place] [address] [phone]           Next level of care provider has access to [place] and Suicide Prevention discussed: Yes,  SPE completed with pt; contact attempts made with pt's sister. SPI pamphlet and Mobile Crisis information provided.  Have you used any form of tobacco in the last 30 days? (Cigarettes, Smokeless Tobacco, Cigars, and/or Pipes): Yes  Has patient been referred to the Quitline?: Patient refused referral  Patient has been referred for addiction treatment: Yes  Emalina Dubreuil N Smart LCSW 04/14/2017, 8:51 AM

## 2017-04-14 NOTE — Progress Notes (Signed)
Pt discharged home with a friend. Pt was ambulatory, stable and appreciative at that time. All papers and prescriptions were given and valuables returned. Verbal understanding expressed. Denies SI/HI and A/VH. Pt given opportunity to express concerns and ask questions.

## 2017-04-14 NOTE — Discharge Summary (Signed)
Physician Discharge Summary Note  Patient:  Becky [name] is an 56 y.o., female  MRN:  [id number]  DOB:  02/16/1961  Patient phone:  [phone] (home)   Patient address:   [address] [place] [address],   Total Time spent with patient: Greater than 30 minutes  Date of Admission:  04/09/2017 Date of Discharge: 04-14-17  Reason for Admission: Worsening symptoms of depression with commanding auditory hallucinations.  Principal Problem: Severe recurrent major depression without psychotic features [facility])  Discharge Diagnoses: Patient Active Problem List   Diagnosis Date Noted  . Severe recurrent major depression without psychotic features ([place]) [F33.2] 04/09/2017  . Chronic hepatitis C without hepatic coma ([place]) [B18.2]   . Alcohol use disorder, severe, dependence ([place]) [F10.20] 01/20/2016  . Cocaine use disorder, severe, dependence ([place]) [F14.20] 01/20/2016  . 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 Chronic pain syndrome [G89.4] 01/20/2016  . Hepatitis C [B19.20] 01/20/2016  . Prolapsed internal hemorrhoids [K64.8] 06/08/2015  . Constipation [K59.00] 06/08/2015  . Major depressive disorder, recurrent, severe without psychotic features ([place]) [F33.2]    Past Psychiatric History: Hx. Polysubstance dependence  Past Medical History:  Past Medical History:  Diagnosis Date  . Bronchitis   . Cocaine abuse   . Depression   . Headache   . Hypertension   . MDD (major depressive disorder)     Past Surgical History:  Procedure Laterality Date  . COLONOSCOPY WITH PROPOFOL N/A 06/23/2015   SLF: 1. Rectal bleeding/pain due to hemorrhoids2. one large and one small colorectal polyp removed.   . cysts removed from armpits bilaterally    . POLYPECTOMY N/A 06/23/2015   Procedure: POLYPECTOMY;  Surgeon: Danie Binder, MD;  Location: [facility];  Service: Endoscopy;  Laterality: N/A;  descending colon   Family History:  Family History  Problem Relation Age of Onset  .  Heart failure Mother   . Coronary artery disease Mother   . Heart failure Father   . Coronary artery disease Father   . Colon cancer Neg Hx   . Mental illness Neg Hx    Family Psychiatric  History: See H&P  Social History:  History  Alcohol Use  . 0.6 oz/week  . 1 Cans of beer per week     History  Drug Use  . Types: Benzodiazepines, Cocaine, Marijuana    Comment: "anything I can get my hands on except needles"    Social History   Social History  . Marital status: Single    Spouse name: N/A  . Number of children: N/A  . Years of education: N/A   Social History Main Topics  . Smoking status: Current Some Day Smoker    Packs/day: 0.50    Types: Cigarettes  . Smokeless tobacco: Never Used     Comment: vague reports of smoking intermittently   . Alcohol use 0.6 oz/week    1 Cans of beer per week  . Drug use: Yes    Types: Benzodiazepines, Cocaine, Marijuana     Comment: "anything I can get my hands on except needles"  . Sexual activity: Yes   Other Topics Concern  . None   Social History Narrative  . None   Hospital Course: Brexlee is a 56 yo AAF single, homeless, unemployed. Background history of SUD and Depressive disorder. Self presented to the ER. Expressed feelings of worthlessness and hopelessness. Has been using alcohol, cocaine and THC regularly. Expressed command auditory hallucination to end her life. Has been off her  medications due to cost.  After admission assessment, Katey's presenting symptoms were noted.The medication regimen targeting those symptoms were discussed & initiated. She was medicated & discharged on; Abilify 5 mg for mood control, Nicotine patch 21 mg for smoking cessation, Sertraline 50 mg daily for depression & Trazodone 50 mg for insomnia. She was enrolled & participated in the group counseling sessions being offered & held on this unit. She learned coping skills. She received other medication regimen for the other minor medical issues  presented. She tolerated her treatment regimen without any adverse effects or reactions reported.   Seen today. Feeling good again. Reports her mood has lifted. She is no longer having headaches. She has normal biological functions. She is thinking clearly. She is looking forward to spend time with her boyfriend/husband. No thoughts of suicide. Future oriented. No thoughts of violence. No thoughts of homicide. No evidence of anxiety. I observed her dancing at the hallway yesterday evening.  Nursing staff reports that patient has been appropriate on the unit. Patient has been interacting well with peers. No behavioral issues. Patient has not voiced any suicidal thoughts. Patient has not been observed to be internally stimulated. Patient has been adherent with treatment recommendations. Patient has been tolerating their medication well.   Patient was discussed at team. Team members feels that patient is back to her baseline level of function. Team agrees with plan to discharge patient today to continue mental health care on an outpatient basis as noted below. She is provided with all the necessary information needed to make this appointment in no apparent distress. Transportation per friend.  Physical Findings: AIMS: Facial and Oral Movements Muscles of Facial Expression: None, normal Lips and Perioral Area: None, normal Jaw: None, normal Tongue: None, normal,Extremity Movements Upper (arms, wrists, hands, fingers): None, normal Lower (legs, knees, ankles, toes): None, normal, Trunk Movements Neck, shoulders, hips: None, normal, Overall Severity Severity of abnormal movements (highest score from questions above): None, normal Incapacitation due to abnormal movements: None, normal Patient's awareness of abnormal movements (rate only patient's report): No Awareness, Dental Status Current problems with teeth and/or dentures?: No Does patient usually wear dentures?: No  CIWA:  CIWA-Ar Total:  1 COWS:  COWS Total Score: 1  Musculoskeletal: Strength & Muscle Tone: within normal limits Gait & Station: normal Patient leans: N/A  Psychiatric Specialty Exam: Physical Exam  Constitutional: She appears well-developed.  HENT:  Head: Normocephalic.  Eyes: Pupils are equal, round, and reactive to light.  Neck: Normal range of motion.  Cardiovascular: Normal rate.   Respiratory: Effort normal.  GI: Soft.  Genitourinary:  Genitourinary Comments: Deferred  Musculoskeletal: Normal range of motion.  Neurological: She is alert.  Skin: Skin is warm.    Review of Systems  Constitutional: Negative.   HENT: Negative.   Eyes: Negative.   Respiratory: Negative.   Cardiovascular: Negative.   Gastrointestinal: Negative.   Genitourinary: Negative.   Musculoskeletal: Negative.   Skin: Negative.   Neurological: Negative.   Endo/Heme/Allergies: Negative.   Psychiatric/Behavioral: Positive for depression ( Stable) and substance abuse (Hx. Cocaine/THC use disorder.). Negative for hallucinations, memory loss and suicidal ideas. The patient has insomnia (Stable). The patient is not nervous/anxious.     Blood pressure (!) 146/87, pulse 80, temperature 98.4 F (36.9 C), temperature source Oral, resp. rate 18, height 5\' 9"  (1.753 m), weight 78.9 kg (174 lb).Body mass index is 25.7 kg/m.  See Md's SRA   Have you used any form of tobacco in the last 30 days? (  Cigarettes, Smokeless Tobacco, Cigars, and/or Pipes): Yes  Has this patient used any form of tobacco in the last 30 days? (Cigarettes, Smokeless Tobacco, Cigars, and/or Pipes): Yes, provided with a nicotine patch 21 mg prescription upon discharge for smoking cessation.  Blood Alcohol level:  Lab Results  Component Value Date   ETH <5 04/08/2017   ETH 5 (H) 28/31/5176   Metabolic Disorder Labs:  Lab Results  Component Value Date   HGBA1C 5.9 (H) 04/09/2017   MPG 123 04/09/2017   Lab Results  Component Value Date   PROLACTIN 20.2  04/09/2017   Lab Results  Component Value Date   CHOL 228 (H) 04/09/2017   TRIG 191 (H) 04/09/2017   HDL 58 04/09/2017   CHOLHDL 3.9 04/09/2017   VLDL 38 04/09/2017   LDLCALC 132 (H) 04/09/2017   See Psychiatric Specialty Exam and Suicide Risk Assessment completed by Attending Physician prior to discharge.  Discharge destination:  Home  Is patient on multiple antipsychotic therapies at discharge:  No   Has Patient had three or more failed trials of antipsychotic monotherapy by history:  No  Recommended Plan for Multiple Antipsychotic Therapies: NA  Allergies as of 04/14/2017   No Known Allergies     Medication List    STOP taking these medications   diclofenac 50 MG EC tablet Commonly known as:  VOLTAREN   diphenhydramine-acetaminophen 25-500 MG Tabs tablet Commonly known as:  TYLENOL PM   DULoxetine 30 MG capsule Commonly known as:  CYMBALTA   phenazopyridine 200 MG tablet Commonly known as:  PYRIDIUM     TAKE these medications     Indication  ARIPiprazole 5 MG tablet Commonly known as:  ABILIFY Take 1 tablet (5 mg total) by mouth daily. For mood control Start taking on:  04/15/2017  Indication:  Mood control   ibuprofen 600 MG tablet Commonly known as:  ADVIL,MOTRIN Take 1 tablet (600 mg total) by mouth every 6 (six) hours as needed for headache or moderate pain. What changed:  medication strength  how much to take  Indication:  Migraine Headache   lisinopril 5 MG tablet Commonly known as:  PRINIVIL,ZESTRIL Take 1 tablet (5 mg total) by mouth daily. For high blood pressure Start taking on:  04/15/2017 What changed:  medication strength  how much to take  additional instructions  Indication:  High Blood Pressure Disorder   nicotine 21 mg/24hr patch Commonly known as:  NICODERM CQ - dosed in mg/24 hours Place 1 patch (21 mg total) onto the skin daily. For smoking cessation Start taking on:  04/15/2017  Indication:  Nicotine Addiction    oxymetazoline 0.05 % nasal spray Commonly known as:  AFRIN Place 1 spray into both nostrils 2 (two) times daily as needed for congestion.  Indication:  Stuffy Nose   pantoprazole 20 MG tablet Commonly known as:  PROTONIX Take 1 tablet (20 mg total) by mouth daily. For acid reflux Start taking on:  04/15/2017  Indication:  Gastroesophageal Reflux Disease   sertraline 100 MG tablet Commonly known as:  ZOLOFT Take 1 tablet (100 mg total) by mouth daily. For depression Start taking on:  04/15/2017  Indication:  Major Depressive Disorder   traZODone 100 MG tablet Commonly known as:  DESYREL Take 1 tablet (100 mg) at bedtime: For sleep  Indication:  [facility] Follow up on 04/18/2017.   Why:  Walk in Tuesday between 12pm-2:30PM to be seen for  hospital follow-up. Please bring Photo ID to this first appt.  Thank you.  Contact information: [address] [place] [address] [phone]          Follow-up recommendations: Activity:  As tolerated Diet: As recommended by your primary care doctor. Keep all scheduled follow-up appointments as recommended.  Comments: Patient is instructed prior to discharge to: Take all medications as prescribed by his/her mental healthcare provider. Report any adverse effects and or reactions from the medicines to his/her outpatient provider promptly. Patient has been instructed & cautioned: To not engage in alcohol and or illegal drug use while on prescription medicines. In the event of worsening symptoms, patient is instructed to call the crisis hotline, 911 and or go to the nearest ED for appropriate evaluation and treatment of symptoms. To follow-up with his/her primary care provider for your other medical issues, concerns and or health care needs.    Signed: Encarnacion Slates, NP, PMHNP, FNP-BC 04/14/2017, 10:55 AM

## 2017-05-20 ENCOUNTER — Emergency Department (HOSPITAL_COMMUNITY): Payer: Self-pay

## 2017-05-20 ENCOUNTER — Encounter (HOSPITAL_COMMUNITY): Payer: Self-pay | Admitting: Emergency Medicine

## 2017-05-20 ENCOUNTER — Emergency Department (HOSPITAL_COMMUNITY)
Admission: EM | Admit: 2017-05-20 | Discharge: 2017-05-20 | Disposition: A | Discharge status: Home / Self Care | Payer: Self-pay | Attending: Emergency Medicine | Admitting: Emergency Medicine

## 2017-05-20 DIAGNOSIS — Z79899 Other long term (current) drug therapy: Secondary | ICD-10-CM | POA: Insufficient documentation

## 2017-05-20 DIAGNOSIS — J208 Acute bronchitis due to other specified organisms: Secondary | ICD-10-CM | POA: Insufficient documentation

## 2017-05-20 DIAGNOSIS — I10 Essential (primary) hypertension: Secondary | ICD-10-CM | POA: Insufficient documentation

## 2017-05-20 DIAGNOSIS — F17210 Nicotine dependence, cigarettes, uncomplicated: Secondary | ICD-10-CM | POA: Insufficient documentation

## 2017-05-20 DIAGNOSIS — J40 Bronchitis, not specified as acute or chronic: Secondary | ICD-10-CM

## 2017-05-20 MED ORDER — IPRATROPIUM-ALBUTEROL 0.5-2.5 (3) MG/3ML IN SOLN
3.0000 mL | Freq: Once | RESPIRATORY_TRACT | Status: AC
  Administered 2017-05-20: 3 mL via RESPIRATORY_TRACT
  Filled 2017-05-20: qty 3

## 2017-05-20 MED ORDER — PREDNISONE 50 MG PO TABS
60.0000 mg | ORAL_TABLET | Freq: Once | ORAL | Status: AC
  Administered 2017-05-20: 60 mg via ORAL
  Filled 2017-05-20: qty 1

## 2017-05-20 MED ORDER — ALBUTEROL SULFATE (2.5 MG/3ML) 0.083% IN NEBU
2.5000 mg | INHALATION_SOLUTION | Freq: Once | RESPIRATORY_TRACT | Status: AC
  Administered 2017-05-20: 2.5 mg via RESPIRATORY_TRACT
  Filled 2017-05-20: qty 3

## 2017-05-20 MED ORDER — SULFAMETHOXAZOLE-TRIMETHOPRIM 800-160 MG PO TABS: 1.0000 | ORAL_TABLET | Freq: Two times a day (BID) | ORAL | 0 refills | Status: AC

## 2017-05-20 MED ORDER — ALBUTEROL SULFATE HFA 108 (90 BASE) MCG/ACT IN AERS
2.0000 | INHALATION_SPRAY | RESPIRATORY_TRACT | Status: DC | PRN
  Administered 2017-05-20: 2 via RESPIRATORY_TRACT
  Filled 2017-05-20 (×2): qty 6.7

## 2017-05-20 NOTE — Discharge Instructions (Signed)
Follow up at the [facility] clinic

## 2017-05-20 NOTE — ED Provider Notes (Addendum)
[facility] DEPT Provider Note   CSN: [id number] Arrival date & time: 05/20/17  1152     History   Chief Complaint Chief Complaint  Patient presents with  . Shortness of Breath    since; last night    HPI Becky [name] is a 56 y.o. female.  Pt complains of sob and cough   The history is provided by the patient. No language interpreter was used.  Shortness of Breath  This is a new problem. The problem occurs intermittently.The current episode started more than 2 days ago. The problem has not changed since onset.Associated symptoms include wheezing. Pertinent negatives include no fever, no headaches, no cough, no chest pain, no abdominal pain and no rash. It is unknown what precipitated the problem. She has tried nothing for the symptoms. The treatment provided no relief. Associated medical issues do not include pneumonia.    Past Medical History:  Diagnosis Date  . Bronchitis   . Cocaine abuse   . Depression   . Headache   . Hypertension   . MDD (major depressive disorder)     Patient Active Problem List   Diagnosis Date Noted  . Severe recurrent major depression without psychotic features ([place]) 04/09/2017  . Chronic hepatitis C without hepatic coma ([place])   . Alcohol use disorder, severe, dependence ([place]) 01/20/2016  . Cocaine use disorder, severe, dependence ([place]) 01/20/2016  . Mild benzodiazepine use disorder 01/20/2016  . Chronic pain syndrome 01/20/2016  . Hepatitis C 01/20/2016  . Prolapsed internal hemorrhoids 06/08/2015  . Constipation 06/08/2015  . Major depressive disorder, recurrent, severe without psychotic features [facility])     Past Surgical History:  Procedure Laterality Date  . COLONOSCOPY WITH PROPOFOL N/A 06/23/2015   SLF: 1. Rectal bleeding/pain due to hemorrhoids2. one large and one small colorectal polyp removed.   . cysts removed from armpits bilaterally    . POLYPECTOMY N/A 06/23/2015   Procedure: POLYPECTOMY;  Surgeon: Danie Binder, MD;   Location: [facility];  Service: Endoscopy;  Laterality: N/A;  descending colon    OB History    Gravida Para Term Preterm AB Living   5 3 2 1 2      SAB TAB Ectopic Multiple Live Births   2               Home Medications    Prior to Admission medications   Medication Sig Start Date End Date Taking? Authorizing Provider  ARIPiprazole (ABILIFY) 5 MG tablet Take 1 tablet (5 mg total) by mouth daily. For mood control 04/15/17  Yes Nwoko, Herbert Pun I, NP  ibuprofen (ADVIL,MOTRIN) 600 MG tablet Take 1 tablet (600 mg total) by mouth every 6 (six) hours as needed for headache or moderate pain. 04/14/17  Yes Lindell Spar I, NP  lisinopril (PRINIVIL,ZESTRIL) 5 MG tablet Take 1 tablet (5 mg total) by mouth daily. For high blood pressure 04/15/17  Yes Nwoko, Agnes I, NP  nicotine (NICODERM CQ - DOSED IN MG/24 HOURS) 21 mg/24hr patch Place 1 patch (21 mg total) onto the skin daily. For smoking cessation 04/15/17  Yes Lindell Spar I, NP  oxymetazoline (AFRIN) 0.05 % nasal spray Place 1 spray into both nostrils 2 (two) times daily as needed for congestion. 04/14/17  Yes Lindell Spar I, NP  pantoprazole (PROTONIX) 20 MG tablet Take 1 tablet (20 mg total) by mouth daily. For acid reflux 04/15/17  Yes Lindell Spar I, NP  sertraline (ZOLOFT) 100 MG tablet Take 1 tablet (100 mg total) by  mouth daily. For depression 04/15/17  Yes Lindell Spar I, NP  traZODone (DESYREL) 100 MG tablet Take 1 tablet (100 mg) at bedtime: For sleep 04/14/17  Yes Lindell Spar I, NP  sulfamethoxazole-trimethoprim (BACTRIM DS,SEPTRA DS) 800-160 MG tablet Take 1 tablet by mouth 2 (two) times daily. 05/20/17 05/27/17  Milton Ferguson, MD    Family History Family History  Problem Relation Age of Onset  . Heart failure Mother   . Coronary artery disease Mother   . Heart failure Father   . Coronary artery disease Father   . Colon cancer Neg Hx   . Mental illness Neg Hx     Social History Social History  Substance Use Topics  . Smoking status:  Current Some Day Smoker    Packs/day: 0.50    Types: Cigarettes  . Smokeless tobacco: Never Used     Comment: vague reports of smoking intermittently   . Alcohol use 0.6 oz/week    1 Cans of beer per week     Allergies   Patient has no allergy information on record.   Review of Systems Review of Systems  Constitutional: Negative for appetite change, fatigue and fever.  HENT: Negative for congestion, ear discharge and sinus pressure.   Eyes: Negative for discharge.  Respiratory: Positive for shortness of breath and wheezing. Negative for cough.   Cardiovascular: Negative for chest pain.  Gastrointestinal: Negative for abdominal pain and diarrhea.  Genitourinary: Negative for frequency and hematuria.  Musculoskeletal: Negative for back pain.  Skin: Negative for rash.  Neurological: Negative for seizures and headaches.  Psychiatric/Behavioral: Negative for hallucinations.     Physical Exam Updated Vital Signs BP 124/88 (BP Location: Right Arm)   Pulse 79   Temp 98.1 F (36.7 C) (Oral)   Resp 20   Ht 5\' 9"  (1.753 m)   Wt 72.6 kg (160 lb)   SpO2 98%   BMI 23.63 kg/m   Physical Exam  Constitutional: [name], place, and time. She appears well-developed.  HENT:  Head: Normocephalic.  Eyes: Conjunctivae and EOM are normal. No scleral icterus.  Neck: Neck supple. No thyromegaly present.  Cardiovascular: Normal rate and regular rhythm.  Exam reveals no gallop and no friction rub.   No murmur heard. Pulmonary/Chest: No stridor. She has wheezes. She has no rales. She exhibits no tenderness.  Abdominal: She exhibits no distension. There is no tenderness. There is no rebound.  Musculoskeletal: Normal range of motion. She exhibits no edema.  Lymphadenopathy:    She has no cervical adenopathy.  Neurological: [name], place, and time. She exhibits normal muscle tone. Coordination normal.  Skin: No rash noted. No erythema.  Psychiatric: She  has a normal mood and affect. Her behavior is normal.     ED Treatments / Results  Labs (all labs ordered are listed, but only abnormal results are displayed) Labs Reviewed - No data to display  EKG  EKG Interpretation None       Radiology Dg Chest 2 View  Result Date: 05/20/2017 CLINICAL DATA:  Shortness of breath. EXAM: CHEST  2 VIEW COMPARISON:  10/10/2016 FINDINGS: Cardiomediastinal silhouette is normal. Mediastinal contours appear intact. There is no evidence of focal airspace consolidation, pleural effusion or pneumothorax. Osseous structures are without acute abnormality. Soft tissues are grossly normal. IMPRESSION: No active cardiopulmonary disease. Electronically Signed   By: Fidela Salisbury M.D.   On: 05/20/2017 13:32    Procedures Procedures (including critical care time)  Medications  Ordered in ED Medications  albuterol (PROVENTIL HFA;VENTOLIN HFA) 108 (90 Base) MCG/ACT inhaler 2 puff (not administered)  ipratropium-albuterol (DUONEB) 0.5-2.5 (3) MG/3ML nebulizer solution 3 mL (3 mLs Nebulization Given 05/20/17 1245)  albuterol (PROVENTIL) (2.5 MG/3ML) 0.083% nebulizer solution 2.5 mg (2.5 mg Nebulization Given 05/20/17 1245)  predniSONE (DELTASONE) tablet 60 mg (60 mg Oral Given 05/20/17 1306)     Initial Impression / Assessment and Plan / ED Course  I have reviewed the triage vital signs and the nursing notes.  Pertinent labs & imaging results that were available during my care of the patient were reviewed by me and considered in my medical decision making (see chart for details).     Pt with bronchitis and bronchospasm.  rx albuterol and bactrim  Final Clinical Impressions(s) / ED Diagnoses   Final diagnoses:  Bronchitis    New Prescriptions New Prescriptions   SULFAMETHOXAZOLE-TRIMETHOPRIM (BACTRIM DS,SEPTRA DS) 800-160 MG TABLET    Take 1 tablet by mouth 2 (two) times daily.     Milton Ferguson, MD 05/20/17 1345    Milton Ferguson,  MD 05/20/17 1346

## 2017-05-20 NOTE — ED Triage Notes (Signed)
Pt w hx of depression anxiety and SA, reports SOB since last night when "I was doing something I have no business "

## 2017-05-20 NOTE — ED Triage Notes (Signed)
Shortnesss of breath since last night

## 2017-12-10 ENCOUNTER — Encounter (HOSPITAL_COMMUNITY): Payer: Self-pay | Admitting: Emergency Medicine

## 2017-12-10 ENCOUNTER — Emergency Department (HOSPITAL_COMMUNITY)
Admission: EM | Admit: 2017-12-10 | Discharge: 2017-12-10 | Disposition: A | Discharge status: Home / Self Care | Payer: Medicaid Other | Attending: Emergency Medicine | Admitting: Emergency Medicine

## 2017-12-10 ENCOUNTER — Emergency Department (HOSPITAL_COMMUNITY): Payer: Medicaid Other

## 2017-12-10 ENCOUNTER — Other Ambulatory Visit: Payer: Self-pay

## 2017-12-10 DIAGNOSIS — R1013 Epigastric pain: Secondary | ICD-10-CM | POA: Insufficient documentation

## 2017-12-10 DIAGNOSIS — F17210 Nicotine dependence, cigarettes, uncomplicated: Secondary | ICD-10-CM | POA: Diagnosis not present

## 2017-12-10 HISTORY — DX: Bipolar disorder, unspecified: F31.9

## 2017-12-10 LAB — LIPASE, BLOOD: Lipase: 20 U/L (ref 11–51)

## 2017-12-10 LAB — CBC
HEMATOCRIT: 45.9 % (ref 36.0–46.0)
HEMOGLOBIN: 15 g/dL (ref 12.0–15.0)
MCH: 28.4 pg (ref 26.0–34.0)
MCHC: 32.7 g/dL (ref 30.0–36.0)
MCV: 86.9 fL (ref 78.0–100.0)
PLATELETS: 195 10*3/uL (ref 150–400)
RBC: 5.28 MIL/uL — ABNORMAL HIGH (ref 3.87–5.11)
RDW: 13.6 % (ref 11.5–15.5)
WBC: 5.7 10*3/uL (ref 4.0–10.5)

## 2017-12-10 LAB — BASIC METABOLIC PANEL
ANION GAP: 12 (ref 5–15)
BUN: 19 mg/dL (ref 6–20)
CHLORIDE: 105 mmol/L (ref 101–111)
CO2: 22 mmol/L (ref 22–32)
Calcium: 9.3 mg/dL (ref 8.9–10.3)
Creatinine, Ser: 0.94 mg/dL (ref 0.44–1.00)
GFR calc non Af Amer: >60 mL/min (ref >60)
Glucose, Bld: 100 mg/dL — ABNORMAL HIGH (ref 65–99)
POTASSIUM: 4.3 mmol/L (ref 3.5–5.1)
SODIUM: 139 mmol/L (ref 135–145)

## 2017-12-10 LAB — I-STAT TROPONIN, ED
TROPONIN I, POC: 0 ng/mL (ref 0.00–0.08)
Troponin i, poc: 0.01 ng/mL (ref 0.00–0.08)

## 2017-12-10 LAB — I-STAT BETA HCG BLOOD, ED (MC, WL, AP ONLY)

## 2017-12-10 MED ORDER — GI COCKTAIL ~~LOC~~
30.0000 mL | Freq: Once | ORAL | Status: AC
  Administered 2017-12-10: 30 mL via ORAL
  Filled 2017-12-10: qty 30

## 2017-12-10 MED ORDER — FAMOTIDINE 20 MG PO TABS
20.0000 mg | ORAL_TABLET | Freq: Once | ORAL | Status: AC
  Administered 2017-12-10: 20 mg via ORAL
  Filled 2017-12-10: qty 1

## 2017-12-10 MED ORDER — FAMOTIDINE 20 MG PO TABS: 20.0000 mg | ORAL_TABLET | Freq: Two times a day (BID) | ORAL | 0 refills | Status: DC

## 2017-12-10 NOTE — ED Provider Notes (Signed)
Emergency Department Provider Note   I have reviewed the triage vital signs and the nursing notes.   HISTORY  Chief Complaint Chest Pain   HPI Becky [name] is a 57 y.o. female with multiple medical problems as documented below the presents to the emergency department today secondary to epigastric pain.  Patient states that this has been present off and on since before New Year's.  She is had a lot of stress during that time and has decreased appetite as well.  She states it feels similar to previous episodes of reflux but cannot afford to get reflux medication so her daughters brought her here for evaluation.  She does not have any associated shortness of breath but does have a chronic cough without change.  Does have intermittent nausea but denies any vomiting.  States his shortness of breath is more related to the cough and not the chest discomfort.  No history of blood clots or recent risk factors.  No wheezing, fevers. No other associated or modifying symptoms.    Past Medical History:  Diagnosis Date  . Bipolar affective ([place])   . Bronchitis   . Cocaine abuse ([place])   . Depression   . Headache   . Hypertension   . MDD (major depressive disorder)     Patient Active Problem List   Diagnosis Date Noted  . Severe recurrent major depression without psychotic features ([place]) 04/09/2017  . Chronic hepatitis C without hepatic coma ([place])   . Alcohol use disorder, severe, dependence ([place]) 01/20/2016  . Cocaine use disorder, severe, dependence ([place]) 01/20/2016  . Mild benzodiazepine use disorder ([place]) 01/20/2016  . Chronic pain syndrome 01/20/2016  . Hepatitis C 01/20/2016  . Prolapsed internal hemorrhoids 06/08/2015  . Constipation 06/08/2015  . Major depressive disorder, recurrent, severe without psychotic features [facility])     Past Surgical History:  Procedure Laterality Date  . COLONOSCOPY WITH PROPOFOL N/A 06/23/2015   SLF: 1. Rectal bleeding/pain due to hemorrhoids2. one  large and one small colorectal polyp removed.   . cysts removed from armpits bilaterally    . POLYPECTOMY N/A 06/23/2015   Procedure: POLYPECTOMY;  Surgeon: Danie Binder, MD;  Location: [facility];  Service: Endoscopy;  Laterality: N/A;  descending colon    Current Outpatient Rx  . Order #: [id number] Class: No Print  . Order #: [id number] Class: Print    Allergies Patient has no known allergies.  Family History  Problem Relation Age of Onset  . Heart failure Mother   . Coronary artery disease Mother   . Heart failure Father   . Coronary artery disease Father   . Colon cancer Neg Hx   . Mental illness Neg Hx     Social History Social History   Tobacco Use  . Smoking status: Current Some Day Smoker    Packs/day: 0.50    Types: Cigarettes  . Smokeless tobacco: Never Used  . Tobacco comment: vague reports of smoking intermittently   Substance Use Topics  . Alcohol use: Yes    Alcohol/week: 0.6 oz    Types: 1 Cans of beer per week  . Drug use: Yes    Types: Benzodiazepines, Marijuana    Review of Systems  All other systems negative except as documented in the HPI. All pertinent positives and negatives as reviewed in the HPI. ____________________________________________   PHYSICAL EXAM:  VITAL SIGNS: ED Triage Vitals  Enc Vitals Group     BP 12/10/17 0915 (S) (!) 153/112  Pulse Rate 12/10/17 0915 (!) 57     Resp 12/10/17 0915 17     Temp 12/10/17 0915 97.9 F (36.6 C)     Temp Source 12/10/17 0915 Oral     SpO2 12/10/17 0915 100 %     Weight 12/10/17 0912 174 lb (78.9 kg)     Height 12/10/17 0912 5\' 9"  (1.753 m)    Constitutional: Alert and oriented. Well appearing and in no acute distress. Eyes: Conjunctivae are normal. PERRL. EOMI. Head: Atraumatic. Nose: No congestion/rhinnorhea. Mouth/Throat: Mucous membranes are moist.  Oropharynx non-erythematous. Neck: No stridor.  No meningeal signs.   Cardiovascular: Normal rate, regular rhythm. Good peripheral  circulation. Grossly normal heart sounds.   Respiratory: Normal respiratory effort.  No retractions. Lungs CTAB. Gastrointestinal: Soft and nontender. No distention.  Musculoskeletal: No lower extremity tenderness nor edema. No gross deformities of extremities. Neurologic:  Normal speech and language. No gross focal neurologic deficits are appreciated.  Skin:  Skin is warm, dry and intact. No rash noted.   ____________________________________________   LABS (all labs ordered are listed, but only abnormal results are displayed)  Labs Reviewed  BASIC METABOLIC PANEL - Abnormal; Notable for the following components:      Result Value   Glucose, Bld 100 (*)    All other components within normal limits  CBC - Abnormal; Notable for the following components:   RBC 5.28 (*)    All other components within normal limits  LIPASE, BLOOD  I-STAT TROPONIN, ED  I-STAT BETA HCG BLOOD, ED ([facility], [facility], [facility] ONLY)  I-STAT TROPONIN, ED   ____________________________________________  EKG   EKG Interpretation  Date/Time:  Sunday December 10 2017 09:13:54 EST Ventricular Rate:  59 PR Interval:    QRS Duration: 91 QT Interval:  515 QTC Calculation: 511 R Axis:   83 Text Interpretation:  Sinus rhythm Nonspecific T abnormalities, lateral leads Prolonged QT interval No significant change since last tracing Confirmed by Merrily Pew [phone]) on 12/10/2017 10:18:40 AM       ____________________________________________  RADIOLOGY  Dg Chest 2 View  Result Date: 12/10/2017 CLINICAL DATA:  57 year old with chest pain.  Epigastric pain. EXAM: CHEST  2 VIEW COMPARISON:  05/20/2017 FINDINGS: Coarse lung markings appear to be chronic. No focal airspace disease or pulmonary edema. Heart and mediastinum are within normal limits. Trachea is midline. No large pleural effusions. Probable nipple shadow in the left lower chest. Old left rib fractures. IMPRESSION: No active cardiopulmonary disease. Electronically  Signed   By: Markus Daft M.D.   On: 12/10/2017 09:47    ____________________________________________   PROCEDURES  Procedure(s) performed:   Procedures   ____________________________________________   INITIAL IMPRESSION / ASSESSMENT AND PLAN / ED COURSE  Patient with likely reflux however has risk factors so will evaluate with EKG, troponin and lipase.  We will treat her symptoms at this time and reevaluate for improvement.  Workup unremarkable.  Patient with improved symptoms.  I discussed with her again around for a second troponin however she is adamant she feels better and wants to go home.  I think is low likelihood this is ACS she does have a couple risk factors but still was not consistent so I think going home is okay and she is competent to make that decision and can return here if any worsening symptoms.  She will try to get her follow-up appointment and start reflux medication at this time.  Pertinent labs & imaging results that were available during my care of  the patient were reviewed by me and considered in my medical decision making (see chart for details).  ____________________________________________  FINAL CLINICAL IMPRESSION(S) / ED DIAGNOSES  Final diagnoses:  Epigastric pain     MEDICATIONS GIVEN DURING THIS VISIT:  Medications  gi cocktail (Maalox,Lidocaine,Donnatal) (30 mLs Oral Given 12/10/17 1024)  famotidine (PEPCID) tablet 20 mg (20 mg Oral Given 12/10/17 1024)     NEW OUTPATIENT MEDICATIONS STARTED DURING THIS VISIT:  New Prescriptions   FAMOTIDINE (PEPCID) 20 MG TABLET    Take 1 tablet (20 mg total) by mouth 2 (two) times daily.    Note:  This note was prepared with assistance of Dragon voice recognition software. Occasional wrong-word or sound-a-like substitutions may have occurred due to the inherent limitations of voice recognition software.   Merrily Pew, MD 12/10/17 1221

## 2017-12-10 NOTE — ED Triage Notes (Signed)
Patient c/o intermittent chest pain/epigastric pain that started before New Years. Patient states shortness of breath, nausea, vomiting, dizziness, and right arm numbness. Denies any cardiac hx. Patient states she has had ocassional productive cough. Uses inhaler for acute bronchitis with no relief.

## 2018-04-09 ENCOUNTER — Emergency Department (HOSPITAL_COMMUNITY)
Admission: EM | Admit: 2018-04-09 | Discharge: 2018-04-09 | Disposition: A | Discharge status: Home / Self Care | Payer: Medicaid Other | Attending: Emergency Medicine | Admitting: Emergency Medicine

## 2018-04-09 ENCOUNTER — Encounter (HOSPITAL_COMMUNITY): Payer: Self-pay

## 2018-04-09 ENCOUNTER — Other Ambulatory Visit: Payer: Self-pay

## 2018-04-09 DIAGNOSIS — Z5321 Procedure and treatment not carried out due to patient leaving prior to being seen by health care provider: Secondary | ICD-10-CM | POA: Diagnosis not present

## 2018-04-09 DIAGNOSIS — R51 Headache: Secondary | ICD-10-CM | POA: Insufficient documentation

## 2018-04-09 NOTE — ED Triage Notes (Signed)
Patient complains of headache x2 days. Has taken ibuprofen with no relief. BP 178/120, not taking BP medications. Reports of blurry vision. No deficits noted.

## 2018-04-09 NOTE — ED Notes (Addendum)
Patient stated to writer while walking out of ed doors she was leaving. Patient ambulatory out of ed with steady gait noted.

## 2018-04-11 NOTE — ED Notes (Signed)
04/11/2018, Attempted follow - up call, no answer.

## 2018-05-14 ENCOUNTER — Emergency Department (HOSPITAL_COMMUNITY): Payer: Medicaid Other

## 2018-05-14 ENCOUNTER — Emergency Department (HOSPITAL_COMMUNITY)
Admission: EM | Admit: 2018-05-14 | Discharge: 2018-05-14 | Disposition: A | Discharge status: Home / Self Care | Payer: Medicaid Other | Attending: Emergency Medicine | Admitting: Emergency Medicine

## 2018-05-14 ENCOUNTER — Encounter (HOSPITAL_COMMUNITY): Payer: Self-pay

## 2018-05-14 DIAGNOSIS — F17210 Nicotine dependence, cigarettes, uncomplicated: Secondary | ICD-10-CM | POA: Insufficient documentation

## 2018-05-14 DIAGNOSIS — J40 Bronchitis, not specified as acute or chronic: Secondary | ICD-10-CM | POA: Insufficient documentation

## 2018-05-14 DIAGNOSIS — I10 Essential (primary) hypertension: Secondary | ICD-10-CM | POA: Insufficient documentation

## 2018-05-14 DIAGNOSIS — K029 Dental caries, unspecified: Secondary | ICD-10-CM

## 2018-05-14 DIAGNOSIS — R05 Cough: Secondary | ICD-10-CM | POA: Diagnosis present

## 2018-05-14 LAB — CBC WITH DIFFERENTIAL/PLATELET
BASOS PCT: 0 %
Basophils Absolute: 0 10*3/uL (ref 0.0–0.1)
Eosinophils Absolute: 0 10*3/uL (ref 0.0–0.7)
Eosinophils Relative: 1 %
HEMATOCRIT: 44.9 % (ref 36.0–46.0)
HEMOGLOBIN: 14.7 g/dL (ref 12.0–15.0)
LYMPHS ABS: 2.1 10*3/uL (ref 0.7–4.0)
Lymphocytes Relative: 39 %
MCH: 28.9 pg (ref 26.0–34.0)
MCHC: 32.7 g/dL (ref 30.0–36.0)
MCV: 88.4 fL (ref 78.0–100.0)
MONOS PCT: 11 %
Monocytes Absolute: 0.6 10*3/uL (ref 0.1–1.0)
NEUTROS ABS: 2.6 10*3/uL (ref 1.7–7.7)
NEUTROS PCT: 49 %
Platelets: 181 10*3/uL (ref 150–400)
RBC: 5.08 MIL/uL (ref 3.87–5.11)
RDW: 14.3 % (ref 11.5–15.5)
WBC: 5.3 10*3/uL (ref 4.0–10.5)

## 2018-05-14 LAB — BASIC METABOLIC PANEL
Anion gap: 9 (ref 5–15)
BUN: 13 mg/dL (ref 6–20)
CALCIUM: 9.2 mg/dL (ref 8.9–10.3)
CHLORIDE: 109 mmol/L (ref 101–111)
CO2: 23 mmol/L (ref 22–32)
CREATININE: 0.88 mg/dL (ref 0.44–1.00)
GFR calc Af Amer: >60 mL/min (ref >60)
GFR calc non Af Amer: >60 mL/min (ref >60)
GLUCOSE: 156 mg/dL — AB (ref 65–99)
Potassium: 3.4 mmol/L — ABNORMAL LOW (ref 3.5–5.1)
Sodium: 141 mmol/L (ref 135–145)

## 2018-05-14 LAB — TROPONIN I: Troponin I: <0.03 ng/mL (ref <0.03)

## 2018-05-14 MED ORDER — IPRATROPIUM-ALBUTEROL 0.5-2.5 (3) MG/3ML IN SOLN
3.0000 mL | Freq: Once | RESPIRATORY_TRACT | Status: AC
  Administered 2018-05-14: 3 mL via RESPIRATORY_TRACT
  Filled 2018-05-14: qty 3

## 2018-05-14 MED ORDER — AMOXICILLIN 500 MG PO CAPS: 500.0000 mg | ORAL_CAPSULE | Freq: Three times a day (TID) | ORAL | 0 refills | Status: DC

## 2018-05-14 MED ORDER — KETOROLAC TROMETHAMINE 60 MG/2ML IM SOLN
60.0000 mg | Freq: Once | INTRAMUSCULAR | Status: AC
  Administered 2018-05-14: 60 mg via INTRAMUSCULAR
  Filled 2018-05-14: qty 2

## 2018-05-14 MED ORDER — HYDROCOD POLST-CPM POLST ER 10-8 MG/5ML PO SUER: 5.0000 mL | Freq: Two times a day (BID) | ORAL | 0 refills | Status: DC | PRN

## 2018-05-14 MED ORDER — PREDNISONE 10 MG PO TABS: 20.0000 mg | ORAL_TABLET | Freq: Every day | ORAL | 0 refills | Status: DC

## 2018-05-14 MED ORDER — HYDROCOD POLST-CPM POLST ER 10-8 MG/5ML PO SUER
5.0000 mL | Freq: Once | ORAL | Status: AC
  Administered 2018-05-14: 5 mL via ORAL
  Filled 2018-05-14: qty 5

## 2018-05-14 MED ORDER — HYDROCOD POLST-CPM POLST ER 10-8 MG/5ML PO SUER: 5.0000 mL | Freq: Two times a day (BID) | ORAL | Status: DC

## 2018-05-14 MED ORDER — BENZONATATE 100 MG PO CAPS
100.0000 mg | ORAL_CAPSULE | Freq: Once | ORAL | Status: AC
  Administered 2018-05-14: 100 mg via ORAL
  Filled 2018-05-14: qty 1

## 2018-05-14 NOTE — ED Provider Notes (Signed)
[facility] EMERGENCY DEPARTMENT Provider Note   CSN: [id number] Arrival date & time: 05/14/18  1123     History   Chief Complaint Chief Complaint  Patient presents with  . Cough    HPI Becky [name] is a 57 y.o. female.  Patient presents with persistent cough for several days worse since last night.  She is using a MDI with minimal relief.  Review of systems positive for headache and chest soreness with cough.  No substernal chest pain, dyspnea, nausea, diaphoresis.  She does not have a primary care doctor.  Past medical history significant for bipolar, depression, cocaine, hypertension.  View of systems positive dental pain in the left lower mandible.     Past Medical History:  Diagnosis Date  . Bipolar affective ([place])   . Bronchitis   . Cocaine abuse ([place])   . Depression   . Headache   . Hypertension   . MDD (major depressive disorder)     Patient Active Problem List   Diagnosis Date Noted  . Severe recurrent major depression without psychotic features ([place]) 04/09/2017  . Chronic hepatitis C without hepatic coma ([place])   . Alcohol use disorder, severe, dependence ([place]) 01/20/2016  . Cocaine use disorder, severe, dependence ([place]) 01/20/2016  . Mild benzodiazepine use disorder ([place]) 01/20/2016  . Chronic pain syndrome 01/20/2016  . Hepatitis C 01/20/2016  . Prolapsed internal hemorrhoids 06/08/2015  . Constipation 06/08/2015  . Major depressive disorder, recurrent, severe without psychotic features [facility])     Past Surgical History:  Procedure Laterality Date  . COLONOSCOPY WITH PROPOFOL N/A 06/23/2015   SLF: 1. Rectal bleeding/pain due to hemorrhoids2. one large and one small colorectal polyp removed.   . cysts removed from armpits bilaterally    . POLYPECTOMY N/A 06/23/2015   Procedure: POLYPECTOMY;  Surgeon: Danie Binder, MD;  Location: [facility];  Service: Endoscopy;  Laterality: N/A;  descending colon     OB History    Gravida  5   Para  3   Term  2   Preterm  1   AB  2   Living        SAB  2   TAB      Ectopic      Multiple      Live Births               Home Medications    Prior to Admission medications   Medication Sig Start Date End Date Taking? Authorizing Provider  albuterol (PROVENTIL HFA;VENTOLIN HFA) 108 (90 Base) MCG/ACT inhaler Inhale 1-2 puffs into the lungs every 6 (six) hours as needed for wheezing or shortness of breath.   Yes [provider]  oxymetazoline (AFRIN) 0.05 % nasal spray Place 1 spray into both nostrils 2 (two) times daily as needed for congestion. 04/14/17  Yes Lindell Spar I, NP  amoxicillin (AMOXIL) 500 MG capsule Take 1 capsule (500 mg total) by mouth 3 (three) times daily. 05/14/18   [name], MD  chlorpheniramine-HYDROcodone [facility]) 10-8 MG/5ML SUER Take 5 mLs by mouth every 12 (twelve) hours as needed for cough. 05/14/18   [name], MD  famotidine (PEPCID) 20 MG tablet Take 1 tablet (20 mg total) by mouth 2 (two) times daily. Patient not taking: Reported on 05/14/2018 12/10/17   Mesner, Corene Cornea, MD  predniSONE (DELTASONE) 10 MG tablet Take 2 tablets (20 mg total) by mouth daily. 05/14/18   [name], MD    Family History Family  History  Problem Relation Age of Onset  . Heart failure Mother   . Coronary artery disease Mother   . Heart failure Father   . Coronary artery disease Father   . Colon cancer Neg Hx   . Mental illness Neg Hx     Social History Social History   Tobacco Use  . Smoking status: Current Some Day Smoker    Packs/day: 0.50    Types: Cigarettes  . Smokeless tobacco: Never Used  . Tobacco comment: vague reports of smoking intermittently   Substance Use Topics  . Alcohol use: Yes    Alcohol/week: 0.6 oz    Types: 1 Cans of beer per week  . Drug use: Yes    Types: Benzodiazepines, Marijuana, Cocaine    Comment: used last 2 weeks ago     Allergies   Patient has no known allergies.   Review of Systems Review of Systems    All other systems reviewed and are negative.    Physical Exam Updated Vital Signs BP (!) 150/100   Pulse 77   Temp 98.9 F (37.2 C) (Oral)   Resp 15   Ht 5\' 9"  (1.753 m)   Wt 79.4 kg (175 lb)   SpO2 100%   BMI 25.84 kg/m   Physical Exam  Constitutional: [name], place, and time. She appears well-developed and well-nourished.  HENT:  Head: Normocephalic and atraumatic.  Caries left lower mandible  Eyes: Conjunctivae are normal.  Neck: Neck supple.  Cardiovascular: Normal rate and regular rhythm.  Pulmonary/Chest: Effort normal and breath sounds normal.  No wheezing or rhonchi.  Abdominal: Soft. Bowel sounds are normal.  Musculoskeletal: Normal range of motion.  Neurological: She is alert and oriented to person, place, and time.  Skin: Skin is warm and dry.  Psychiatric: She has a normal mood and affect. Her behavior is normal.  Nursing note and vitals reviewed.    ED Treatments / Results  Labs (all labs ordered are listed, but only abnormal results are displayed) Labs Reviewed  BASIC METABOLIC PANEL - Abnormal; Notable for the following components:      Result Value   Potassium 3.4 (*)    Glucose, Bld 156 (*)    All other components within normal limits  CBC WITH DIFFERENTIAL/PLATELET  TROPONIN I    EKG EKG Interpretation  Date/Time:  Monday May 14 2018 11:52:15 EDT Ventricular Rate:  74 PR Interval:    QRS Duration: 89 QT Interval:  452 QTC Calculation: 502 R Axis:   97 Text Interpretation:  Sinus rhythm Borderline right axis deviation Borderline T wave abnormalities Borderline prolonged QT interval Confirmed by [name] [phone]) on 05/14/2018 1:36:09 PM   Radiology Dg Chest 2 View  Result Date: 05/14/2018 CLINICAL DATA:  Cough and chest pain beginning yesterday. Shortness of breath. EXAM: CHEST - 2 VIEW COMPARISON:  12/10/2017 FINDINGS: The heart size and mediastinal contours are within normal limits. Both lungs are clear.  Multiple old left rib fracture deformities again noted. IMPRESSION: Stable exam.  No active cardiopulmonary disease. Electronically Signed   By: Earle Gell M.D.   On: 05/14/2018 12:25    Procedures Procedures (including critical care time)  Medications Ordered in ED Medications  ipratropium-albuterol (DUONEB) 0.5-2.5 (3) MG/3ML nebulizer solution 3 mL (3 mLs Nebulization Given 05/14/18 1216)  ketorolac (TORADOL) injection 60 mg (60 mg Intramuscular Given 05/14/18 1300)  ipratropium-albuterol (DUONEB) 0.5-2.5 (3) MG/3ML nebulizer solution 3 mL (3 mLs Nebulization Given 05/14/18 1304)  benzonatate (  TESSALON) capsule 100 mg (100 mg Oral Given 05/14/18 1442)  chlorpheniramine-HYDROcodone (TUSSIONEX) 10-8 MG/5ML suspension 5 mL (5 mLs Oral Given 05/14/18 1442)     Initial Impression / Assessment and Plan / ED Course  I have reviewed the triage vital signs and the nursing notes.  Pertinent labs & imaging results that were available during my care of the patient were reviewed by me and considered in my medical decision making (see chart for details).     Patient presents with persistent cough.  Chest x-ray, EKG, troponin show no significant anomalies.  She was given a nebulizer treatment, IM Toradol and Tussionex in the ED.  Discharge medications prednisone, amoxicillin 500 mg and Tussionex suspension  Final Clinical Impressions(s) / ED Diagnoses   Final diagnoses:  Bronchitis  Tooth caries    ED Discharge Orders        Ordered    predniSONE (DELTASONE) 10 MG tablet  Daily     05/14/18 1503    amoxicillin (AMOXIL) 500 MG capsule  3 times daily     05/14/18 1503    chlorpheniramine-HYDROcodone (TUSSIONEX PENNKINETIC ER) 10-8 MG/5ML SUER  Every 12 hours PRN     05/14/18 1506       [name], MD 05/14/18 1512

## 2018-05-14 NOTE — Discharge Instructions (Addendum)
X-ray showed no pneumonia.  Prescription for prednisone, cough syrup, antibiotic.  You need to get a primary care doctor.

## 2018-05-14 NOTE — ED Triage Notes (Signed)
Pt reports that her cough has gotten worse since last night. Cough is so forceful causing her head to hurt. Using MDI which is not helping. Feels SOB and Chest hurting

## 2018-05-22 ENCOUNTER — Encounter: Payer: Self-pay | Admitting: Gastroenterology

## 2018-05-22 NOTE — Congregational Nurse Program (Signed)
Congregational Nurse Program Note  Date of Encounter: 05/22/2018  Past Medical History: Past Medical History:  Diagnosis Date  . Bipolar affective ([place])   . Bronchitis   . Cocaine abuse ([place])   . Depression   . Headache   . Hypertension   . MDD (major depressive disorder)     Encounter Details: CNP Questionnaire - 05/02/18 2245      Questionnaire   Patient Status  Not Applicable    Race  Black or African American    Location Patient Served At  [employer] [employer]  Not Applicable    Uninsured  Uninsured (NEW 1x/quarter)    Food  No food insecurities    Housing/Utilities  Yes, have permanent housing    Transportation  No transportation needs    Interpersonal Safety  Yes, feel physically and emotionally safe where you currently live    Medication  No medication insecurities    Medical Provider  Yes    Referrals  [facility]    ED Visit Averted  Not Applicable    Life-Saving Intervention Made  Not Applicable     Seen at the [employer] B/P 173/110 -P 61  and 42minutes later 156/104 Stated she had not taken her tmedication today  [email or web address] importance of taking meds daily,  Eating a low sodium diet and getting exercise Refused to have me call for an appointment at. [facility]. Stated she has an appointment on next [place] [phone]

## 2018-06-10 ENCOUNTER — Emergency Department (HOSPITAL_COMMUNITY)
Admission: EM | Admit: 2018-06-10 | Discharge: 2018-06-10 | Disposition: A | Discharge status: Home / Self Care | Payer: Medicaid Other | Source: Home / Self Care | Attending: Emergency Medicine | Admitting: Emergency Medicine

## 2018-06-10 ENCOUNTER — Other Ambulatory Visit: Payer: Self-pay

## 2018-06-10 ENCOUNTER — Encounter (HOSPITAL_COMMUNITY): Payer: Self-pay | Admitting: Emergency Medicine

## 2018-06-10 ENCOUNTER — Emergency Department (HOSPITAL_COMMUNITY): Payer: Medicaid Other

## 2018-06-10 DIAGNOSIS — I10 Essential (primary) hypertension: Secondary | ICD-10-CM | POA: Insufficient documentation

## 2018-06-10 DIAGNOSIS — L03116 Cellulitis of left lower limb: Secondary | ICD-10-CM | POA: Insufficient documentation

## 2018-06-10 DIAGNOSIS — F17210 Nicotine dependence, cigarettes, uncomplicated: Secondary | ICD-10-CM | POA: Insufficient documentation

## 2018-06-10 DIAGNOSIS — Z79899 Other long term (current) drug therapy: Secondary | ICD-10-CM | POA: Insufficient documentation

## 2018-06-10 LAB — BASIC METABOLIC PANEL
Anion gap: 8 (ref 5–15)
BUN: 18 mg/dL (ref 6–20)
CHLORIDE: 110 mmol/L (ref 98–111)
CO2: 20 mmol/L — AB (ref 22–32)
Calcium: 9 mg/dL (ref 8.9–10.3)
Creatinine, Ser: 0.84 mg/dL (ref 0.44–1.00)
GFR calc Af Amer: >60 mL/min (ref >60)
GLUCOSE: 122 mg/dL — AB (ref 70–99)
POTASSIUM: 4.1 mmol/L (ref 3.5–5.1)
Sodium: 138 mmol/L (ref 135–145)

## 2018-06-10 LAB — CBC WITH DIFFERENTIAL/PLATELET
Basophils Absolute: 0 10*3/uL (ref 0.0–0.1)
Basophils Relative: 0 %
EOS PCT: 1 %
Eosinophils Absolute: 0.1 10*3/uL (ref 0.0–0.7)
HEMATOCRIT: 45.4 % (ref 36.0–46.0)
Hemoglobin: 15.2 g/dL — ABNORMAL HIGH (ref 12.0–15.0)
LYMPHS ABS: 2.7 10*3/uL (ref 0.7–4.0)
LYMPHS PCT: 29 %
MCH: 29.7 pg (ref 26.0–34.0)
MCHC: 33.5 g/dL (ref 30.0–36.0)
MCV: 88.7 fL (ref 78.0–100.0)
Monocytes Absolute: 0.6 10*3/uL (ref 0.1–1.0)
Monocytes Relative: 6 %
NEUTROS ABS: 5.9 10*3/uL (ref 1.7–7.7)
Neutrophils Relative %: 64 %
PLATELETS: 189 10*3/uL (ref 150–400)
RBC: 5.12 MIL/uL — AB (ref 3.87–5.11)
RDW: 14.1 % (ref 11.5–15.5)
WBC: 9.3 10*3/uL (ref 4.0–10.5)

## 2018-06-10 MED ORDER — AMLODIPINE BESYLATE 5 MG PO TABS
10.0000 mg | ORAL_TABLET | Freq: Once | ORAL | Status: AC
  Administered 2018-06-10: 10 mg via ORAL
  Filled 2018-06-10: qty 2

## 2018-06-10 MED ORDER — CLINDAMYCIN PHOSPHATE 600 MG/50ML IV SOLN
600.0000 mg | Freq: Once | INTRAVENOUS | Status: AC
  Administered 2018-06-10: 600 mg via INTRAVENOUS
  Filled 2018-06-10: qty 50

## 2018-06-10 MED ORDER — LIDOCAINE-EPINEPHRINE-TETRACAINE (LET) SOLUTION
3.0000 mL | Freq: Once | NASAL | Status: AC
  Administered 2018-06-10: 12:00:00 3 mL via TOPICAL
  Filled 2018-06-10: qty 3

## 2018-06-10 MED ORDER — AMLODIPINE BESYLATE 5 MG PO TABS: 5.0000 mg | ORAL_TABLET | Freq: Every day | ORAL | 0 refills | Status: DC

## 2018-06-10 MED ORDER — CEPHALEXIN 500 MG PO CAPS: 500.0000 mg | ORAL_CAPSULE | Freq: Four times a day (QID) | ORAL | 0 refills | Status: DC

## 2018-06-10 MED ORDER — HYDROCODONE-ACETAMINOPHEN 5-325 MG PO TABS: ORAL_TABLET | ORAL | 0 refills | Status: DC

## 2018-06-10 MED ORDER — ONDANSETRON 4 MG PO TBDP
4.0000 mg | ORAL_TABLET | Freq: Once | ORAL | Status: AC
  Administered 2018-06-10: 4 mg via ORAL
  Filled 2018-06-10: qty 1

## 2018-06-10 MED ORDER — FENTANYL CITRATE (PF) 100 MCG/2ML IJ SOLN
25.0000 ug | Freq: Once | INTRAMUSCULAR | Status: AC
Start: 2018-06-10 — End: 2018-06-10
  Administered 2018-06-10: 25 ug via INTRAVENOUS
  Filled 2018-06-10: qty 2

## 2018-06-10 MED ORDER — FENTANYL CITRATE (PF) 100 MCG/2ML IJ SOLN
25.0000 ug | Freq: Once | INTRAMUSCULAR | Status: AC
  Administered 2018-06-10: 25 ug via INTRAVENOUS
  Filled 2018-06-10: qty 2

## 2018-06-10 MED ORDER — SULFAMETHOXAZOLE-TRIMETHOPRIM 800-160 MG PO TABS: 1.0000 | ORAL_TABLET | Freq: Two times a day (BID) | ORAL | 0 refills | Status: DC

## 2018-06-10 MED ORDER — SODIUM CHLORIDE 0.9 % IV BOLUS
1000.0000 mL | Freq: Once | INTRAVENOUS | Status: AC
  Administered 2018-06-10: 1000 mL via INTRAVENOUS
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
## 2018-06-10 NOTE — ED Notes (Signed)
Pt request lights dimmed as she reports tired and is starved   Lights dimmed
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
## 2018-06-10 NOTE — ED Notes (Signed)
meds for complaint of N

## 2018-06-10 NOTE — ED Notes (Signed)
Pt refuses to allow knee to be tapped by TT stating that she hates needles  She then request that she be given home meds to decrease pain

## 2018-06-10 NOTE — Discharge Instructions (Addendum)
You have an infection to your knee.  It's important to elevate and soak your knee in warm water or warm wet compresses 3-4 times a day.  Take the antibiotics as directed.  Return here tomorrow afternoon (before 5) for recheck.  Your blood pressure is also elevated. I have listed two local clinics for you to establish primary care.

## 2018-06-11 ENCOUNTER — Encounter (HOSPITAL_COMMUNITY): Payer: Self-pay | Admitting: Emergency Medicine

## 2018-06-11 ENCOUNTER — Emergency Department (HOSPITAL_COMMUNITY): Payer: Medicaid Other

## 2018-06-11 ENCOUNTER — Other Ambulatory Visit: Payer: Self-pay

## 2018-06-11 ENCOUNTER — Inpatient Hospital Stay (HOSPITAL_COMMUNITY)
Admission: EM | Admit: 2018-06-11 | Discharge: 2018-06-13 | DRG: 603 | Disposition: A | Discharge status: Home / Self Care | Payer: Medicaid Other | Attending: Internal Medicine | Admitting: Internal Medicine

## 2018-06-11 DIAGNOSIS — Z720 Tobacco use: Secondary | ICD-10-CM | POA: Diagnosis not present

## 2018-06-11 DIAGNOSIS — G894 Chronic pain syndrome: Secondary | ICD-10-CM | POA: Diagnosis present

## 2018-06-11 DIAGNOSIS — B182 Chronic viral hepatitis C: Secondary | ICD-10-CM | POA: Diagnosis present

## 2018-06-11 DIAGNOSIS — F17210 Nicotine dependence, cigarettes, uncomplicated: Secondary | ICD-10-CM | POA: Diagnosis present

## 2018-06-11 DIAGNOSIS — I10 Essential (primary) hypertension: Secondary | ICD-10-CM | POA: Diagnosis present

## 2018-06-11 DIAGNOSIS — Z716 Tobacco abuse counseling: Secondary | ICD-10-CM

## 2018-06-11 DIAGNOSIS — F332 Major depressive disorder, recurrent severe without psychotic features: Secondary | ICD-10-CM | POA: Diagnosis present

## 2018-06-11 DIAGNOSIS — L03116 Cellulitis of left lower limb: Principal | ICD-10-CM | POA: Diagnosis present

## 2018-06-11 DIAGNOSIS — F1020 Alcohol dependence, uncomplicated: Secondary | ICD-10-CM | POA: Diagnosis present

## 2018-06-11 DIAGNOSIS — F1420 Cocaine dependence, uncomplicated: Secondary | ICD-10-CM | POA: Diagnosis present

## 2018-06-11 LAB — BASIC METABOLIC PANEL
ANION GAP: 9 (ref 5–15)
BUN: 12 mg/dL (ref 6–20)
CALCIUM: 9.2 mg/dL (ref 8.9–10.3)
CO2: 22 mmol/L (ref 22–32)
Chloride: 110 mmol/L (ref 98–111)
Creatinine, Ser: 0.76 mg/dL (ref 0.44–1.00)
GFR calc Af Amer: >60 mL/min (ref >60)
GLUCOSE: 99 mg/dL (ref 70–99)
Potassium: 4.5 mmol/L (ref 3.5–5.1)
Sodium: 141 mmol/L (ref 135–145)

## 2018-06-11 LAB — CBC WITH DIFFERENTIAL/PLATELET
BASOS ABS: 0 10*3/uL (ref 0.0–0.1)
BASOS PCT: 0 %
EOS PCT: 1 %
Eosinophils Absolute: 0.1 10*3/uL (ref 0.0–0.7)
HCT: 44.7 % (ref 36.0–46.0)
Hemoglobin: 14.7 g/dL (ref 12.0–15.0)
Lymphocytes Relative: 23 %
Lymphs Abs: 2.2 10*3/uL (ref 0.7–4.0)
MCH: 29.1 pg (ref 26.0–34.0)
MCHC: 32.9 g/dL (ref 30.0–36.0)
MCV: 88.3 fL (ref 78.0–100.0)
MONO ABS: 0.6 10*3/uL (ref 0.1–1.0)
Monocytes Relative: 6 %
Neutro Abs: 6.4 10*3/uL (ref 1.7–7.7)
Neutrophils Relative %: 70 %
PLATELETS: 175 10*3/uL (ref 150–400)
RBC: 5.06 MIL/uL (ref 3.87–5.11)
RDW: 14.1 % (ref 11.5–15.5)
WBC: 9.3 10*3/uL (ref 4.0–10.5)

## 2018-06-11 LAB — I-STAT CG4 LACTIC ACID, ED: Lactic Acid, Venous: 1.04 mmol/L (ref 0.5–1.9)

## 2018-06-11 MED ORDER — ACETAMINOPHEN 650 MG RE SUPP: 650.0000 mg | Freq: Four times a day (QID) | RECTAL | Status: DC | PRN

## 2018-06-11 MED ORDER — NICOTINE 21 MG/24HR TD PT24
21.0000 mg | MEDICATED_PATCH | Freq: Every day | TRANSDERMAL | Status: DC
  Administered 2018-06-11 – 2018-06-13 (×3): 21 mg via TRANSDERMAL
  Filled 2018-06-11 (×3): qty 1

## 2018-06-11 MED ORDER — VANCOMYCIN HCL IN DEXTROSE 750-5 MG/150ML-% IV SOLN
750.0000 mg | Freq: Two times a day (BID) | INTRAVENOUS | Status: DC
  Administered 2018-06-12 – 2018-06-13 (×3): 750 mg via INTRAVENOUS
  Filled 2018-06-11 (×7): qty 150

## 2018-06-11 MED ORDER — ACETAMINOPHEN 325 MG PO TABS: 650.0000 mg | ORAL_TABLET | Freq: Four times a day (QID) | ORAL | Status: DC | PRN

## 2018-06-11 MED ORDER — VANCOMYCIN HCL IN DEXTROSE 1-5 GM/200ML-% IV SOLN: 1000.0000 mg | Freq: Two times a day (BID) | INTRAVENOUS | Status: DC

## 2018-06-11 MED ORDER — FOLIC ACID 1 MG PO TABS
1.0000 mg | ORAL_TABLET | Freq: Every day | ORAL | Status: DC
  Administered 2018-06-11 – 2018-06-13 (×3): 1 mg via ORAL
  Filled 2018-06-11 (×3): qty 1

## 2018-06-11 MED ORDER — LORAZEPAM 1 MG PO TABS
1.0000 mg | ORAL_TABLET | Freq: Four times a day (QID) | ORAL | Status: DC | PRN
  Administered 2018-06-12 (×2): 1 mg via ORAL
  Filled 2018-06-11 (×2): qty 1

## 2018-06-11 MED ORDER — THIAMINE HCL 100 MG/ML IJ SOLN: 100.0000 mg | Freq: Every day | INTRAMUSCULAR | Status: DC

## 2018-06-11 MED ORDER — ALBUTEROL SULFATE HFA 108 (90 BASE) MCG/ACT IN AERS
1.0000 | INHALATION_SPRAY | Freq: Four times a day (QID) | RESPIRATORY_TRACT | Status: DC | PRN
Start: 2018-06-11 — End: 2018-06-11

## 2018-06-11 MED ORDER — ENOXAPARIN SODIUM 40 MG/0.4ML ~~LOC~~ SOLN: 40.0000 mg | SUBCUTANEOUS | Status: DC

## 2018-06-11 MED ORDER — VITAMIN B-1 100 MG PO TABS
100.0000 mg | ORAL_TABLET | Freq: Every day | ORAL | Status: DC
  Administered 2018-06-11 – 2018-06-13 (×3): 100 mg via ORAL
  Filled 2018-06-11 (×3): qty 1

## 2018-06-11 MED ORDER — AMLODIPINE BESYLATE 5 MG PO TABS
5.0000 mg | ORAL_TABLET | Freq: Every day | ORAL | Status: DC
  Administered 2018-06-12 – 2018-06-13 (×2): 5 mg via ORAL
  Filled 2018-06-11 (×2): qty 1

## 2018-06-11 MED ORDER — ADULT MULTIVITAMIN W/MINERALS CH
1.0000 | ORAL_TABLET | Freq: Every day | ORAL | Status: DC
  Administered 2018-06-11 – 2018-06-13 (×3): 1 via ORAL
  Filled 2018-06-11 (×3): qty 1

## 2018-06-11 MED ORDER — HYDROCODONE-ACETAMINOPHEN 5-325 MG PO TABS
1.0000 | ORAL_TABLET | ORAL | Status: DC | PRN
  Administered 2018-06-11: 1 via ORAL
  Administered 2018-06-12: 2 via ORAL
  Administered 2018-06-12 – 2018-06-13 (×2): 1 via ORAL
  Administered 2018-06-13: 2 via ORAL
  Filled 2018-06-11 (×2): qty 1
  Filled 2018-06-11 (×2): qty 2
  Filled 2018-06-11: qty 1

## 2018-06-11 MED ORDER — HYDROMORPHONE HCL 1 MG/ML IJ SOLN
1.0000 mg | Freq: Once | INTRAMUSCULAR | Status: AC
  Administered 2018-06-11: 1 mg via INTRAVENOUS
  Filled 2018-06-11: qty 1

## 2018-06-11 MED ORDER — VANCOMYCIN HCL 10 G IV SOLR
1500.0000 mg | Freq: Once | INTRAVENOUS | Status: AC
  Administered 2018-06-11: 1500 mg via INTRAVENOUS
  Filled 2018-06-11: qty 1500

## 2018-06-11 MED ORDER — LORAZEPAM 2 MG/ML IJ SOLN: 1.0000 mg | Freq: Four times a day (QID) | INTRAMUSCULAR | Status: DC | PRN

## 2018-06-11 MED ORDER — IOPAMIDOL (ISOVUE-300) INJECTION 61%
100.0000 mL | Freq: Once | INTRAVENOUS | Status: AC | PRN
  Administered 2018-06-11: 100 mL via INTRAVENOUS

## 2018-06-11 MED ORDER — ALBUTEROL SULFATE (2.5 MG/3ML) 0.083% IN NEBU: 2.5000 mg | INHALATION_SOLUTION | Freq: Four times a day (QID) | RESPIRATORY_TRACT | Status: DC | PRN

## 2018-06-11 MED ORDER — SODIUM CHLORIDE 0.9 % IV BOLUS
1000.0000 mL | Freq: Once | INTRAVENOUS | Status: AC
  Administered 2018-06-11: 1000 mL via INTRAVENOUS

## 2018-06-11 NOTE — ED Notes (Signed)
Nurse and Dr Regenia Skeeter informed.

## 2018-06-11 NOTE — ED Notes (Signed)
This is second time pts daughter came out to ask for spike and then Coke.  Informed daughter not to given fluids to pt and daughter told me it was for her.  Walked in pts room and pt was drinking coke.  Informed the pt and family member the importance of not drinking since pt has been vomiting and results of CT scan are not back.  Pt tears up and says she made her daughter comes ask.  Informed that this delays her care and could potentially become a problem should she need surgery or further studies.
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
## 2018-06-11 NOTE — Progress Notes (Signed)
Pharmacy Antibiotic Note  Becky [name] is a 57 y.o. female admitted on 06/11/2018 with left knee cellulitis.  Pharmacy has been consulted for vancomycin dosing.  Plan: Loading dose:  vancomycin 1.5g IV x1 dose Maintenance dose:  vancomycin 750mg  IV q12h Goal trough: 10-15   mcg/mL Pharmacy will continue to monitor renal function, vancomycin troughs, cultures and patient progress.    Height: 5\' 9"  (175.3 cm) Weight: 165 lb (74.8 kg) IBW/kg (Calculated) : 66.2  Temp (24hrs), Avg:98.4 F (36.9 C), Min:98.4 F (36.9 C), Max:98.4 F (36.9 C)  Recent Labs  Lab 06/10/18 1014 06/11/18 1302 06/11/18 1303  WBC 9.3 9.3  --   CREATININE 0.84 0.76  --   LATICACIDVEN  --   --  1.04    Estimated Creatinine Clearance: 83 mL/min (by C-G formula based on SCr of 0.76 mg/dL).    No Known Allergies  Antimicrobials this admission: 7/22 vancomycin >>    Microbiology results: 7/22 BCx2:  In progress   Thank you for allowing pharmacy to be a part of this patient's care.  Despina Pole, Pharm. D. Clinical Pharmacist 06/11/2018 3:35 PM

## 2018-06-11 NOTE — ED Triage Notes (Signed)
Pt comes in for wound check, was evaluated yesterday. Redness moving down leg per pt and increased swelling. Pt has removed the scab and is "draining wound herself" with clear/white drainage. Denies fevers.

## 2018-06-12 DIAGNOSIS — Z716 Tobacco abuse counseling: Secondary | ICD-10-CM

## 2018-06-12 LAB — BASIC METABOLIC PANEL
ANION GAP: 6 (ref 5–15)
BUN: 10 mg/dL (ref 6–20)
CHLORIDE: 110 mmol/L (ref 98–111)
CO2: 23 mmol/L (ref 22–32)
Calcium: 8.7 mg/dL — ABNORMAL LOW (ref 8.9–10.3)
Creatinine, Ser: 0.67 mg/dL (ref 0.44–1.00)
GFR calc non Af Amer: >60 mL/min (ref >60)
Glucose, Bld: 101 mg/dL — ABNORMAL HIGH (ref 70–99)
POTASSIUM: 3.7 mmol/L (ref 3.5–5.1)
SODIUM: 139 mmol/L (ref 135–145)

## 2018-06-12 LAB — HIV ANTIBODY (ROUTINE TESTING W REFLEX): HIV Screen 4th Generation wRfx: NONREACTIVE

## 2018-06-12 MED ORDER — CLOTRIMAZOLE 1 % VA CREA
1.0000 | TOPICAL_CREAM | Freq: Every day | VAGINAL | Status: DC
  Administered 2018-06-12: 1 via VAGINAL
  Filled 2018-06-12: qty 45
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
## 2018-06-13 DIAGNOSIS — Z720 Tobacco use: Secondary | ICD-10-CM

## 2018-06-13 DIAGNOSIS — F1420 Cocaine dependence, uncomplicated: Secondary | ICD-10-CM

## 2018-06-13 DIAGNOSIS — Z716 Tobacco abuse counseling: Secondary | ICD-10-CM

## 2018-06-13 DIAGNOSIS — L03116 Cellulitis of left lower limb: Principal | ICD-10-CM

## 2018-06-13 DIAGNOSIS — F1020 Alcohol dependence, uncomplicated: Secondary | ICD-10-CM

## 2018-06-13 DIAGNOSIS — I10 Essential (primary) hypertension: Secondary | ICD-10-CM

## 2018-06-13 DIAGNOSIS — B182 Chronic viral hepatitis C: Secondary | ICD-10-CM

## 2018-06-13 LAB — BASIC METABOLIC PANEL
ANION GAP: 6 (ref 5–15)
BUN: 12 mg/dL (ref 6–20)
CALCIUM: 9.1 mg/dL (ref 8.9–10.3)
CO2: 27 mmol/L (ref 22–32)
Chloride: 107 mmol/L (ref 98–111)
Creatinine, Ser: 0.72 mg/dL (ref 0.44–1.00)
GFR calc Af Amer: >60 mL/min (ref >60)
Glucose, Bld: 102 mg/dL — ABNORMAL HIGH (ref 70–99)
POTASSIUM: 4.1 mmol/L (ref 3.5–5.1)
SODIUM: 140 mmol/L (ref 135–145)

## 2018-06-13 LAB — CBC
HCT: 44.7 % (ref 36.0–46.0)
Hemoglobin: 14.5 g/dL (ref 12.0–15.0)
MCH: 29.1 pg (ref 26.0–34.0)
MCHC: 32.4 g/dL (ref 30.0–36.0)
MCV: 89.8 fL (ref 78.0–100.0)
PLATELETS: 191 10*3/uL (ref 150–400)
RBC: 4.98 MIL/uL (ref 3.87–5.11)
RDW: 14 % (ref 11.5–15.5)
WBC: 6.4 10*3/uL (ref 4.0–10.5)

## 2018-06-13 MED ORDER — DOXYCYCLINE HYCLATE 100 MG PO CAPS: 100.0000 mg | ORAL_CAPSULE | Freq: Two times a day (BID) | ORAL | 0 refills | Status: AC

## 2018-06-13 MED ORDER — DOCUSATE SODIUM 100 MG PO CAPS
100.0000 mg | ORAL_CAPSULE | Freq: Two times a day (BID) | ORAL | Status: DC
  Administered 2018-06-13: 100 mg via ORAL
  Filled 2018-06-13: qty 1

## 2018-06-13 MED ORDER — POLYETHYLENE GLYCOL 3350 17 G PO PACK
17.0000 g | PACK | Freq: Every day | ORAL | Status: DC
  Administered 2018-06-13: 17 g via ORAL
  Filled 2018-06-13: qty 1

## 2018-06-13 MED ORDER — NICOTINE 21 MG/24HR TD PT24: 21.0000 mg | MEDICATED_PATCH | Freq: Every day | TRANSDERMAL | 0 refills | Status: DC

## 2018-06-13 MED ORDER — CLOTRIMAZOLE 1 % VA CREA: 1.0000 | TOPICAL_CREAM | Freq: Every day | VAGINAL | 0 refills | Status: DC

## 2018-06-13 MED ORDER — IBUPROFEN 600 MG PO TABS: 600.0000 mg | ORAL_TABLET | Freq: Three times a day (TID) | ORAL | 0 refills | Status: DC | PRN

## 2018-06-13 MED ORDER — CEPHALEXIN 500 MG PO CAPS: 500.0000 mg | ORAL_CAPSULE | Freq: Three times a day (TID) | ORAL | 0 refills | Status: AC

## 2018-06-13 MED ORDER — ADULT MULTIVITAMIN W/MINERALS CH: 1.0000 | ORAL_TABLET | Freq: Every day | ORAL | 3 refills | Status: DC

## 2018-06-13 NOTE — Progress Notes (Signed)
Pt discharged in stable condition into the care of her boyfriend via private vehicle.  PIV removed intact w/o S&S of complications.  Prescriptions given to pt.  Educated pt to finish entire course of antibiotics.  Discharge instructions reviewed with pt.  Pt verbalized understanding.

## 2018-06-13 NOTE — Progress Notes (Signed)
IV removed, 2x2 gauze and paper tape applied to site, patient tolerated well.  AVS reviewed with patient who verbalized understanding.

## 2018-06-13 NOTE — Plan of Care (Signed)
Adequate for discharge.
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
## 2018-06-16 LAB — CULTURE, BLOOD (ROUTINE X 2)
CULTURE: NO GROWTH
Culture: NO GROWTH
SPECIAL REQUESTS: ADEQUATE
Special Requests: ADEQUATE

## 2018-06-26 ENCOUNTER — Encounter: Payer: Self-pay | Admitting: Gastroenterology

## 2018-07-17 ENCOUNTER — Ambulatory Visit: Payer: Medicaid Other

## 2018-08-25 ENCOUNTER — Emergency Department (HOSPITAL_COMMUNITY): Payer: Medicaid Other

## 2018-08-25 ENCOUNTER — Emergency Department (HOSPITAL_COMMUNITY)
Admission: EM | Admit: 2018-08-25 | Discharge: 2018-08-25 | Disposition: A | Discharge status: Home / Self Care | Payer: Medicaid Other | Attending: Emergency Medicine | Admitting: Emergency Medicine

## 2018-08-25 ENCOUNTER — Encounter (HOSPITAL_COMMUNITY): Payer: Self-pay | Admitting: *Deleted

## 2018-08-25 DIAGNOSIS — E860 Dehydration: Secondary | ICD-10-CM | POA: Insufficient documentation

## 2018-08-25 DIAGNOSIS — R05 Cough: Secondary | ICD-10-CM | POA: Diagnosis present

## 2018-08-25 DIAGNOSIS — F17210 Nicotine dependence, cigarettes, uncomplicated: Secondary | ICD-10-CM | POA: Insufficient documentation

## 2018-08-25 DIAGNOSIS — J209 Acute bronchitis, unspecified: Secondary | ICD-10-CM | POA: Insufficient documentation

## 2018-08-25 DIAGNOSIS — I10 Essential (primary) hypertension: Secondary | ICD-10-CM | POA: Diagnosis not present

## 2018-08-25 LAB — COMPREHENSIVE METABOLIC PANEL
ALBUMIN: 4.6 g/dL (ref 3.5–5.0)
ALT: 35 U/L (ref 0–44)
ANION GAP: 7 (ref 5–15)
AST: 33 U/L (ref 15–41)
Alkaline Phosphatase: 58 U/L (ref 38–126)
BUN: 26 mg/dL — AB (ref 6–20)
CO2: 22 mmol/L (ref 22–32)
Calcium: 10 mg/dL (ref 8.9–10.3)
Chloride: 111 mmol/L (ref 98–111)
Creatinine, Ser: 1.06 mg/dL — ABNORMAL HIGH (ref 0.44–1.00)
GFR calc Af Amer: >60 mL/min (ref >60)
GFR calc non Af Amer: 58 mL/min — ABNORMAL LOW (ref >60)
GLUCOSE: 116 mg/dL — AB (ref 70–99)
POTASSIUM: 4.5 mmol/L (ref 3.5–5.1)
SODIUM: 140 mmol/L (ref 135–145)
Total Bilirubin: 0.9 mg/dL (ref 0.3–1.2)
Total Protein: 8.2 g/dL — ABNORMAL HIGH (ref 6.5–8.1)

## 2018-08-25 LAB — CBC WITH DIFFERENTIAL/PLATELET
Basophils Absolute: 0 10*3/uL (ref 0.0–0.1)
Basophils Relative: 1 %
EOS PCT: 2 %
Eosinophils Absolute: 0.1 10*3/uL (ref 0.0–0.7)
HCT: 51.2 % — ABNORMAL HIGH (ref 36.0–46.0)
Hemoglobin: 17.3 g/dL — ABNORMAL HIGH (ref 12.0–15.0)
LYMPHS ABS: 2.8 10*3/uL (ref 0.7–4.0)
LYMPHS PCT: 52 %
MCH: 29.9 pg (ref 26.0–34.0)
MCHC: 33.8 g/dL (ref 30.0–36.0)
MCV: 88.6 fL (ref 78.0–100.0)
MONO ABS: 0.5 10*3/uL (ref 0.1–1.0)
Monocytes Relative: 9 %
Neutro Abs: 1.9 10*3/uL (ref 1.7–7.7)
Neutrophils Relative %: 36 %
Platelets: 215 10*3/uL (ref 150–400)
RBC: 5.78 MIL/uL — ABNORMAL HIGH (ref 3.87–5.11)
RDW: 13.5 % (ref 11.5–15.5)
WBC: 5.3 10*3/uL (ref 4.0–10.5)

## 2018-08-25 LAB — LIPASE, BLOOD: Lipase: 27 U/L (ref 11–51)

## 2018-08-25 LAB — TROPONIN I: Troponin I: <0.03 ng/mL (ref <0.03)

## 2018-08-25 MED ORDER — ACETAMINOPHEN 325 MG PO TABS
650.0000 mg | ORAL_TABLET | Freq: Once | ORAL | Status: AC
  Administered 2018-08-25: 650 mg via ORAL
  Filled 2018-08-25: qty 2

## 2018-08-25 MED ORDER — ALBUTEROL SULFATE HFA 108 (90 BASE) MCG/ACT IN AERS
2.0000 | INHALATION_SPRAY | Freq: Once | RESPIRATORY_TRACT | Status: DC
  Filled 2018-08-25: qty 6.7

## 2018-08-25 MED ORDER — ONDANSETRON 4 MG PO TBDP: 4.0000 mg | ORAL_TABLET | Freq: Three times a day (TID) | ORAL | 0 refills | Status: DC | PRN

## 2018-08-25 MED ORDER — SODIUM CHLORIDE 0.9 % IV BOLUS: 1000.0000 mL | Freq: Once | INTRAVENOUS | Status: DC

## 2018-08-25 MED ORDER — ALBUTEROL SULFATE (2.5 MG/3ML) 0.083% IN NEBU
5.0000 mg | INHALATION_SOLUTION | Freq: Once | RESPIRATORY_TRACT | Status: AC
  Administered 2018-08-25: 5 mg via RESPIRATORY_TRACT
  Filled 2018-08-25: qty 6

## 2018-08-25 NOTE — ED Notes (Signed)
To rad  Pt refuses IV  States, "give it to me by mouth"  Pt has ginger ale as well as water that she is drinking

## 2018-08-25 NOTE — Discharge Instructions (Addendum)
Use the albuterol inhaler 1-2 puffs every 4 hours as needed for cough or shortness of breath.  If you need it more than this or you develop worsening dizziness, shortness of breath, chest pain, or any other new/concerning symptoms and return to the ER for evaluation.  Your labs indicate you are dehydrated and your kidneys are functioning a little worse than typical.  You are encouraged to get IV fluids which you have declined but are also encouraged to drink extra oral fluids.  If your vomiting or unable to drink more fluids then come back to the ER.

## 2018-08-25 NOTE — ED Provider Notes (Signed)
[facility] EMERGENCY DEPARTMENT Provider Note   CSN: [id number] Arrival date & time: 08/25/18  1323     History   Chief Complaint Chief Complaint  Patient presents with  . Shortness of Breath    HPI Becky [name] is a 57 y.o. female.  HPI  57 year old female presents with a chief complaint of cough and not feeling well.  Has been ongoing for about 3 days.  She is been having chest pain that feels like she cannot get a full deep breath.  She is had dyspnea as well as intermittent cough.  No known fevers.  Has had headache, dizziness, nausea with 2 episodes of emesis and 2 episodes of diarrhea.  Also has some generalized abdominal discomfort.  Her urine felt warm the other day but no dysuria.  She has not tried anything for the symptoms.  She does smoke.  Overall feels very dizzy whenever sitting up and feels like she might pass out.  Past Medical History:  Diagnosis Date  . Bipolar affective ([place])   . Bronchitis   . Cocaine abuse ([place])   . Depression   . Headache   . Hypertension   . MDD (major depressive disorder)     Patient Active Problem List   Diagnosis Date Noted  . Tobacco abuse   . Tobacco abuse counseling   . Cellulitis of left knee 06/11/2018  . Essential hypertension 06/11/2018  . Severe recurrent major depression without psychotic features ([place]) 04/09/2017  . Chronic hepatitis C without hepatic coma ([place])   . Alcohol use disorder, severe, dependence ([place]) 01/20/2016  . Cocaine use disorder, severe, dependence ([place]) 01/20/2016  . Mild benzodiazepine use disorder ([place]) 01/20/2016  . Chronic pain syndrome 01/20/2016  . Hepatitis C 01/20/2016  . Prolapsed internal hemorrhoids 06/08/2015  . Constipation 06/08/2015  . Major depressive disorder, recurrent, severe without psychotic features [facility])     Past Surgical History:  Procedure Laterality Date  . COLONOSCOPY WITH PROPOFOL N/A 06/23/2015   SLF: 1. Rectal bleeding/pain due to hemorrhoids2. one large and  one small colorectal polyp removed.   . cysts removed from armpits bilaterally    . POLYPECTOMY N/A 06/23/2015   Procedure: POLYPECTOMY;  Surgeon: Danie Binder, MD;  Location: [facility];  Service: Endoscopy;  Laterality: N/A;  descending colon     OB History    Gravida  5   Para  3   Term  2   Preterm  1   AB  2   Living        SAB  2   TAB      Ectopic      Multiple      Live Births               Home Medications    Prior to Admission medications   Medication Sig Start Date End Date Taking? Authorizing Provider  albuterol (PROVENTIL HFA;VENTOLIN HFA) 108 (90 Base) MCG/ACT inhaler Inhale 1-2 puffs into the lungs every 6 (six) hours as needed for wheezing or shortness of breath.    [provider]  amLODipine (NORVASC) 5 MG tablet Take 1 tablet (5 mg total) by mouth daily. 06/10/18   Triplett, Tammy, PA-C  clotrimazole (GYNE-LOTRIMIN) 1 % vaginal cream Place 1 Applicatorful vaginally at bedtime. 06/13/18   Barton Dubois, MD  ibuprofen (ADVIL,MOTRIN) 600 MG tablet Take 1 tablet (600 mg total) by mouth every 8 (eight) hours as needed for fever, headache or moderate pain. 06/13/18  Barton Dubois, MD  Multiple Vitamin (MULTIVITAMIN WITH MINERALS) TABS tablet Take 1 tablet by mouth daily. 06/14/18   Barton Dubois, MD  nicotine (NICODERM CQ - DOSED IN MG/24 HOURS) 21 mg/24hr patch Place 1 patch (21 mg total) onto the skin daily. 06/14/18   Barton Dubois, MD  ondansetron (ZOFRAN ODT) 4 MG disintegrating tablet Take 1 tablet (4 mg total) by mouth every 8 (eight) hours as needed for nausea or vomiting. 08/25/18   Sherwood Gambler, MD    Family History Family History  Problem Relation Age of Onset  . Heart failure Mother   . Coronary artery disease Mother   . Heart failure Father   . Coronary artery disease Father   . Colon cancer Neg Hx   . Mental illness Neg Hx     Social History Social History   Tobacco Use  . Smoking status: Current Some Day Smoker     Packs/day: 0.50    Types: Cigarettes  . Smokeless tobacco: Never Used  . Tobacco comment: vague reports of smoking intermittently   Substance Use Topics  . Alcohol use: Yes    Alcohol/week: 1.0 standard drinks    Types: 1 Cans of beer per week    Comment: denies use 08/25/18  . Drug use: Yes    Types: Benzodiazepines, Marijuana, Cocaine    Comment: denies use 08/25/18, last used last week per pt     Allergies   Patient has no known allergies.   Review of Systems Review of Systems  Constitutional: Negative for fever.  HENT: Negative for sore throat.   Respiratory: Positive for cough and shortness of breath.   Cardiovascular: Positive for chest pain.  Gastrointestinal: Positive for abdominal pain, diarrhea, nausea and vomiting.  Genitourinary: Negative for dysuria.  Neurological: Positive for dizziness and headaches.  All other systems reviewed and are negative.    Physical Exam Updated Vital Signs BP (!) 128/94   Pulse 64   Temp (!) 97.4 F (36.3 C) (Oral)   Resp 19   Ht 5\' 9"  (1.753 m)   Wt 72.6 kg   SpO2 98%   BMI 23.63 kg/m   Physical Exam  Constitutional: [name], place, and time. She appears well-developed and well-nourished.  Non-toxic appearance. She does not appear ill.  HENT:  Head: Normocephalic and atraumatic.  Right Ear: External ear normal.  Left Ear: External ear normal.  Nose: Nose normal.  Eyes: Pupils are equal, round, and reactive to light. EOM are normal. Right eye exhibits no discharge. Left eye exhibits no discharge.  Neck: Neck supple. No neck rigidity.  Cardiovascular: Normal rate, regular rhythm and normal heart sounds.  Pulmonary/Chest: Effort normal. She has wheezes (mild, expiratory).  Speaks in clear sentences  Abdominal: Soft. There is generalized tenderness.  No focal tenderness. When distracted it is hard to elicit pain with palpation  Neurological: She is alert and oriented to person, place, and time.  CN 3-12  grossly intact. 5/5 strength in all 4 extremities. Grossly normal sensation. Normal finger to nose.   Skin: Skin is warm and dry.  Nursing note and vitals reviewed.    ED Treatments / Results  Labs (all labs ordered are listed, but only abnormal results are displayed) Labs Reviewed  COMPREHENSIVE METABOLIC PANEL - Abnormal; Notable for the following components:      Result Value   Glucose, Bld 116 (*)    BUN 26 (*)    Creatinine, Ser 1.06 (*)    Total  Protein 8.2 (*)    GFR calc non Af Amer 58 (*)    All other components within normal limits  CBC WITH DIFFERENTIAL/PLATELET - Abnormal; Notable for the following components:   RBC 5.78 (*)    Hemoglobin 17.3 (*)    HCT 51.2 (*)    All other components within normal limits  TROPONIN I  LIPASE, BLOOD  URINALYSIS, ROUTINE W REFLEX MICROSCOPIC    EKG EKG Interpretation  Date/Time:  Saturday August 25 2018 14:55:08 EDT Ventricular Rate:  65 PR Interval:    QRS Duration: 97 QT Interval:  514 QTC Calculation: 535 R Axis:   85 Text Interpretation:  Sinus rhythm Borderline short PR interval Prolonged QT interval no significant change since Jun 2019 Confirmed by Sherwood Gambler [phone]) on 08/25/2018 3:01:04 PM   Radiology Dg Chest 2 View  Result Date: 08/25/2018 CLINICAL DATA:  Shortness of Breath EXAM: CHEST - 2 VIEW COMPARISON:  05/14/2018 FINDINGS: Heart and mediastinal contours are within normal limits. No focal opacities or effusions. No acute bony abnormality. IMPRESSION: No active cardiopulmonary disease. Electronically Signed   By: Rolm Baptise M.D.   On: 08/25/2018 14:22    Procedures Procedures (including critical care time)  Medications Ordered in ED Medications  sodium chloride 0.9 % bolus 1,000 mL (1,000 mLs Intravenous Refused 08/25/18 1412)  albuterol (PROVENTIL HFA;VENTOLIN HFA) 108 (90 Base) MCG/ACT inhaler 2 puff (has no administration in time range)  acetaminophen (TYLENOL) tablet 650 mg (650 mg Oral Given  08/25/18 1408)  albuterol (PROVENTIL) (2.5 MG/3ML) 0.083% nebulizer solution 5 mg (5 mg Nebulization Given 08/25/18 1422)     Initial Impression / Assessment and Plan / ED Course  I have reviewed the triage vital signs and the nursing notes.  Pertinent labs & imaging results that were available during my care of the patient were reviewed by me and considered in my medical decision making (see chart for details).     Patient's cough and shortness of breath along with her vague chest symptoms are likely all related to bronchitis.  No obvious bacterial pneumonia.  Lungs are now clear after albuterol treatment.  Her dizziness is probably related to dehydration as indicated with the mildly elevated creatinine and BUN, increased hemoglobin, and overall symptomatology.  I offered her IV fluids but she refuses IV.  I discussed that she will both feel better and her kidney function will probably improve but she still declines.  Given she is no longer vomiting I think is reasonable to let her orally rehydrate but I did discuss return precautions.  She will be given an albuterol inhaler for symptomatic relief.  She declines urine testing and states she does not have any dysuria and does not want any antibiotics anyway.  Overall, her labs are otherwise reassuring, WBC is benign, troponin normal, and I have low suspicion of ACS, PE, dissection.  I think she is stable for discharge home.  Final Clinical Impressions(s) / ED Diagnoses   Final diagnoses:  Acute bronchitis, unspecified organism  Dehydration    ED Discharge Orders         Ordered    ondansetron (ZOFRAN ODT) 4 MG disintegrating tablet  Every 8 hours PRN     08/25/18 1503           Sherwood Gambler, MD 08/25/18 1506

## 2018-08-25 NOTE — ED Notes (Signed)
Call to Resp x2  Call answered but noone on line  Resp paged

## 2018-08-25 NOTE — ED Notes (Signed)
Call to resp 

## 2018-08-25 NOTE — ED Notes (Signed)
Pt reports shortness of breath  Speaking in complete sentences - requesting soda for her daughter and self As well as blanket Shouting, "oh Lord Jesus" when given a warm blanket  Pt has expiratory wheeze but appears in no distress at all

## 2018-08-25 NOTE — ED Notes (Signed)
Pt given albuterol inhaler after discharge  Unable to scan after DC

## 2018-08-25 NOTE — ED Triage Notes (Signed)
Pt with sob x 2 days, denies calling her doctor.  + cough- productive

## 2018-08-25 NOTE — ED Notes (Signed)
Pt signed witness line on DC instruction

## 2018-10-03 ENCOUNTER — Ambulatory Visit (INDEPENDENT_AMBULATORY_CARE_PROVIDER_SITE_OTHER): Payer: Medicaid Other | Admitting: Nurse Practitioner

## 2018-10-03 ENCOUNTER — Other Ambulatory Visit: Payer: Self-pay

## 2018-10-03 ENCOUNTER — Telehealth: Payer: Self-pay

## 2018-10-03 ENCOUNTER — Encounter: Payer: Self-pay | Admitting: Nurse Practitioner

## 2018-10-03 VITALS — BP 172/108 | HR 63 | Temp 96.6°F | Ht 68.0 in | Wt 166.6 lb

## 2018-10-03 DIAGNOSIS — Z860100 Personal history of colon polyps, unspecified: Secondary | ICD-10-CM | POA: Insufficient documentation

## 2018-10-03 DIAGNOSIS — K649 Unspecified hemorrhoids: Secondary | ICD-10-CM

## 2018-10-03 DIAGNOSIS — F1020 Alcohol dependence, uncomplicated: Secondary | ICD-10-CM | POA: Diagnosis not present

## 2018-10-03 DIAGNOSIS — B182 Chronic viral hepatitis C: Secondary | ICD-10-CM | POA: Diagnosis not present

## 2018-10-03 DIAGNOSIS — K439 Ventral hernia without obstruction or gangrene: Secondary | ICD-10-CM | POA: Insufficient documentation

## 2018-10-03 MED ORDER — CLENPIQ 10-3.5-12 MG-GM -GM/160ML PO SOLN: 1.0000 | Freq: Once | ORAL | 0 refills | Status: AC

## 2018-10-03 NOTE — Assessment & Plan Note (Addendum)
The patient uses about 40 ounce of alcohol daily.  She is due for current colonoscopy.  She also admits to using marijuana, benzodiazepines, and cocaine.  Last cocaine use 2 days ago.  I recommended she abstain from drugs and alcohol.  She is planning on attending Narcotics Anonymous to get herself clean.  I informed her that when her preop labs are completed, likely in 1 to 2 months, a urine drug screen would be completed and if cocaine metabolites were found and her procedure would be canceled due to increased risk with sedation/anesthesia.  She verbalized understanding.  We will proceed with colonoscopy at this time as per above.

## 2018-10-03 NOTE — Telephone Encounter (Signed)
Called and informed pt of pre-op appt 12/18/18 at 10:00am. Letter mailed.

## 2018-10-03 NOTE — Assessment & Plan Note (Signed)
The patient states she was told she has hepatitis C virus.  Acute hepatitis panel was positive for hepatitis C in 2017.  I will recheck hep C labs at this time including RNA and genotype.  We will have her follow-up in 4 months to discuss possible treatment options.  I strongly recommend she abstain from drug and alcohol use.  Further work-up to follow.

## 2018-10-03 NOTE — Assessment & Plan Note (Signed)
The patient has a ventral hernia which she states causes intermittent abdominal pain.  On exam her hernia is soft and does not appear strangulated or obstructed.  Minimal to no abdominal pain on palpation.  She was previously scheduled for ventral hernia repair but she did not show for the procedure.  We will refer her back to surgery to discuss hernia repair options.

## 2018-10-03 NOTE — Assessment & Plan Note (Signed)
The patient has significant hemorrhoids which cause intermittent bleeding and rectal pain.  She was previously recommended to follow-up with surgery for hemorrhoidectomy.  She states she does not remember being told this.  We will re-refer her back to surgery for hemorrhoid options.  She states topical therapy is not effective at this time.  I highly recommended she follow-up with surgery.  Follow-up here in 4 months.

## 2018-10-03 NOTE — Telephone Encounter (Signed)
LMOVM for endo scheduler, pt will need urine drug screen done at pre-op appt prior to TCS.

## 2018-10-03 NOTE — Progress Notes (Signed)
CC'ED TO PCP 

## 2018-10-03 NOTE — Patient Instructions (Signed)
1. Have your blood work drawn when you are able to. 2. We will help schedule your colonoscopy for you. 3. We will refer you to a surgeon to discuss your hernia and your hemorrhoids for possible options to fix these. 4. We will call you with your blood work results when we get them. 5. Follow-up in 4 months. 6. Call us if you have any questions or concerns.  At [facility] we value your feedback. You may receive a survey about your visit today. Please share your experience as we strive to create trusting relationships with our patients to provide genuine, compassionate, quality care.  We appreciate your understanding and patience as we review any laboratory studies, imaging, and other diagnostic tests that are ordered as we care for you. Our office policy is 5 business days for review of these results, and any emergent or urgent results are addressed in a timely manner for your best interest. If you do not hear from our office in 1 week, please contact us.   We also encourage the use of MyChart, which contains your medical information for your review as well. If you are not enrolled in this feature, an access code is on this after visit summary for your convenience. Thank you for allowing [place] to be involved in your care.  It was great to meet you today!  I hope you have a great Fall!!

## 2018-10-03 NOTE — Assessment & Plan Note (Addendum)
The patient has a personal history of adenomatous colon polyps on last colonoscopy completed 06/23/2015.  Recommended 3-year repeat exam (2019).  We will proceed with colonoscopy at this time.  Proceed with colonoscopy on propofol/MAC with Dr. Oneida Alar in the near future. The risks, benefits, and alternatives have been discussed in detail with the patient. They state understanding and desire to proceed.   The patient is not on any prescribed anticoagulants, anxiolytics, chronic pain medications, or antidepressants.  She does use benzodiazepines, marijuana, cocaine.  She drinks alcohol 40 ounces a day.  We will plan for the procedure on propofol/MAC to promote adequate sedation.

## 2018-10-03 NOTE — Progress Notes (Signed)
Referring Provider: [facility], [facility]* Primary Care Physician:  [facility], [place] [facility] GI:  Dr. Oneida Alar  Chief Complaint  Patient presents with  . Consult    TCS, Last had done 2016  . Hemorrhoids    c/o rectal bleeding    HPI:   Becky [name] is a 57 y.o. female who presents on referral from primary care to schedule colonoscopy.  Nurse triage was deferred to office visit due to medications and alcohol use.  The patient has not been seen by our office since 11/12/2015 when she was seen for prolapsed internal hemorrhoids and slow transit constipation.  At that time she noticed Linzess was effective.  Hernia bothers her all the time.  Noted history of hemorrhoids.  Recommended follow-up with surgery for consideration of hemorrhoidectomy, Preparation H otherwise.  Take Linzess 290 mcg once daily, high-fiber diet and avoid triggers that cause bloating and gas.  Follow-up in 6 months.  Since then the patient has been lost to follow-up.  Previous colonoscopy completed 06/23/2015 which found rectal bleeding and pain due to hemorrhoids, one large one small colorectal polyps status post removal.  Surgical pathology found the polyp to be tubular adenoma.  Recommended repeat colonoscopy in 3 years (2019), surgical referral for consideration of hemorrhoidectomy.  Today she states she's doing ok overall. Has persistent hemorrhoid pain and intermittent bleeding, usually when she's constipation. Doesn't recall being told to see surgeon for hemorrhoidectomy. Has intermittent abdominal pain related to a ventral hernia but admits she didn't show up for hernia repair surgery. States she doesn't use hemorrhoid toipical treatment "because it doesn't work. They need to be fixed or something." Was told 14 years ago she has hepatitis C. Intermittent N/V, occasional melena "it just hurts, it be like that thing on the wall." She seems preoccupied with hemorrhoids. Denies chest pain, dyspnea,  dizziness, lightheadedness, syncope, near syncope. Denies any other upper or lower GI symptoms.  She is out of her medications, ran out of hypertension medications about 2 weeks ago, states she'll get them today. She is quite hypertensive today without medications, denies dizziness, severe headache, chest pain.  Last used cocaine a couple days ago. Planning on starting NA meetings. States she sometimes uses cocaine because it helps her hemorrhoids.  Past Medical History:  Diagnosis Date  . Bipolar affective ([place])   . Bronchitis   . Cocaine abuse ([place])   . Depression   . Headache   . Hemorrhoids   . Hypertension   . MDD (major depressive disorder)     Past Surgical History:  Procedure Laterality Date  . COLONOSCOPY WITH PROPOFOL N/A 06/23/2015   SLF: 1. Rectal bleeding/pain due to hemorrhoids2. one large and one small colorectal polyp removed.   . cysts removed from armpits bilaterally    . POLYPECTOMY N/A 06/23/2015   Procedure: POLYPECTOMY;  Surgeon: Danie Binder, MD;  Location: [facility];  Service: Endoscopy;  Laterality: N/A;  descending colon    Current Outpatient Medications  Medication Sig Dispense Refill  . acetaminophen (TYLENOL) 325 MG tablet Take 650 mg by mouth every 6 (six) hours as needed.    Marland Kitchen albuterol (PROVENTIL HFA;VENTOLIN HFA) 108 (90 Base) MCG/ACT inhaler Inhale 1-2 puffs into the lungs every 6 (six) hours as needed for wheezing or shortness of breath.    Marland Kitchen amLODipine (NORVASC) 5 MG tablet Take 1 tablet (5 mg total) by mouth daily. (Patient not taking: Reported on 10/03/2018) 30 tablet 0  . clotrimazole (GYNE-LOTRIMIN) 1 %  vaginal cream Place 1 Applicatorful vaginally at bedtime. (Patient not taking: Reported on 10/03/2018) 45 g 0  . ibuprofen (ADVIL,MOTRIN) 600 MG tablet Take 1 tablet (600 mg total) by mouth every 8 (eight) hours as needed for fever, headache or moderate pain. (Patient not taking: Reported on 10/03/2018) 40 tablet 0  . Multiple Vitamin (MULTIVITAMIN  WITH MINERALS) TABS tablet Take 1 tablet by mouth daily. (Patient not taking: Reported on 10/03/2018) 303 tablet 3  . nicotine (NICODERM CQ - DOSED IN MG/24 HOURS) 21 mg/24hr patch Place 1 patch (21 mg total) onto the skin daily. (Patient not taking: Reported on 10/03/2018) 28 patch 0  . ondansetron (ZOFRAN ODT) 4 MG disintegrating tablet Take 1 tablet (4 mg total) by mouth every 8 (eight) hours as needed for nausea or vomiting. (Patient not taking: Reported on 10/03/2018) 10 tablet 0   No current facility-administered medications for this visit.     Allergies as of 10/03/2018  . (No Known Allergies)    Family History  Problem Relation Age of Onset  . Heart failure Mother   . Coronary artery disease Mother   . Heart failure Father   . Coronary artery disease Father   . Colon cancer Neg Hx   . Mental illness Neg Hx     Social History   Socioeconomic History  . Marital status: Single    Spouse name: Not on file  . Number of children: Not on file  . Years of education: Not on file  . Highest education level: Not on file  Occupational History  . Not on file  Social Needs  . Financial resource strain: Not on file  . Food insecurity:    Worry: Not on file    Inability: Not on file  . Transportation needs:    Medical: Not on file    Non-medical: Not on file  Tobacco Use  . Smoking status: Current Some Day Smoker    Packs/day: 0.50    Types: Cigarettes  . Smokeless tobacco: Never Used  . Tobacco comment: vague reports of smoking intermittently   Substance and Sexual Activity  . Alcohol use: Yes    Comment: 40ozs per day  . Drug use: Yes    Types: Benzodiazepines, Marijuana, Cocaine  . Sexual activity: Yes  Lifestyle  . Physical activity:    Days per week: Not on file    Minutes per session: Not on file  . Stress: Not on file  Relationships  . Social connections:    Talks on phone: Not on file    Gets together: Not on file    Attends religious service: Not on file      Active member of club or organization: Not on file    Attends meetings of clubs or organizations: Not on file    Relationship status: Not on file  Other Topics Concern  . Not on file  Social History Narrative  . Not on file    Review of Systems: Complete ROS negative except as per HPI.   Physical Exam: BP (!) 172/108   Pulse 63   Temp (!) 96.6 F (35.9 C) (Oral)   Ht 5\' 8"  (1.727 m)   Wt 166 lb 9.6 oz (75.6 kg)   BMI 25.33 kg/m  General:   Alert and oriented. Pleasant and cooperative. Well-nourished and well-developed.  Eyes:  Without icterus, sclera clear and conjunctiva pink.  Ears:  Normal auditory acuity. Cardiovascular:  S1, S2 present without murmurs appreciated. Extremities without clubbing or  edema. Respiratory:  Clear to auscultation bilaterally. No wheezes, rales, or rhonchi. No distress.  Gastrointestinal:  +BS, soft, and non-distended. Ventral hernia noted soft and minimal TTP. No HSM noted. No guarding or rebound. No masses appreciated.  Rectal:  Deferred  Musculoskalatal:  Symmetrical without gross deformities. Skin:  Intact without significant lesions or rashes. Neurologic:  Alert and oriented x4;  grossly normal neurologically. Psych:  Alert and cooperative. Normal mood and affect. Heme/Lymph/Immune: No excessive bruising noted.    10/03/2018 11:30 AM   Disclaimer: This note was dictated with voice recognition software. Similar sounding words can inadvertently be transcribed and may not be corrected upon review.

## 2018-10-30 ENCOUNTER — Ambulatory Visit (INDEPENDENT_AMBULATORY_CARE_PROVIDER_SITE_OTHER): Payer: Medicaid Other | Admitting: General Surgery

## 2018-10-30 ENCOUNTER — Encounter: Payer: Self-pay | Admitting: General Surgery

## 2018-10-30 VITALS — BP 148/102 | HR 71 | Temp 97.1°F | Resp 18 | Wt 164.8 lb

## 2018-10-30 DIAGNOSIS — K644 Residual hemorrhoidal skin tags: Secondary | ICD-10-CM | POA: Diagnosis not present

## 2018-10-30 DIAGNOSIS — M6208 Separation of muscle (nontraumatic), other site: Secondary | ICD-10-CM

## 2018-10-30 DIAGNOSIS — K643 Fourth degree hemorrhoids: Secondary | ICD-10-CM | POA: Diagnosis not present

## 2018-10-30 DIAGNOSIS — K439 Ventral hernia without obstruction or gangrene: Secondary | ICD-10-CM | POA: Diagnosis not present

## 2018-10-30 NOTE — Patient Instructions (Signed)
Hemorrhoids Hemorrhoids are swollen veins in and around the rectum or anus. Hemorrhoids can cause pain, itching, or bleeding. Most of the time, they do not cause serious problems. They usually get better with diet changes, lifestyle changes, and other home treatments. Follow these instructions at home: Eating and drinking  Eat foods that have fiber, such as whole grains, beans, nuts, fruits, and vegetables. Ask your doctor about taking products that have added fiber (fibersupplements).  Drink enough fluid to keep your pee (urine) clear or pale yellow. For Pain and Swelling  Take a warm-water bath (sitz bath) for 20 minutes to ease pain. Do this 3-4 times a day.  If directed, put ice on the painful area. It may be helpful to use ice between your warm baths. ? Put ice in a plastic bag. ? Place a towel between your skin and the bag. ? Leave the ice on for 20 minutes, 2-3 times a day. General instructions  Take over-the-counter and prescription medicines only as told by your doctor. ? Medicated creams and medicines that are inserted into the anus (suppositories) may be used or applied as told.  Exercise often.  Go to the bathroom when you have the urge to poop (to have a bowel movement). Do not wait.  Avoid pushing too hard (straining) when you poop.  Keep the butt area dry and clean. Use wet toilet paper or moist paper towels.  Do not sit on the toilet for a long time. Contact a doctor if:  You have any of these: ? Pain and swelling that do not get better with treatment or medicine. ? Bleeding that will not stop. ? Trouble pooping or you cannot poop. ? Pain or swelling outside the area of the hemorrhoids. This information is not intended to replace advice given to you by your health care provider. Make sure you discuss any questions you have with your health care provider. Document Released: 08/16/2008 Document Revised: 04/14/2016 Document Reviewed: 07/22/2015 Elsevier  Interactive Patient Education  2018 [place], Adult A hernia is the bulging of an organ or tissue through a weak spot in the muscles of the abdomen (abdominal wall). Hernias develop most often near the navel or groin. There are many kinds of hernias. Common kinds include:  Femoral hernia. This kind of hernia develops under the groin in the upper thigh area.  Inguinal hernia. This kind of hernia develops in the groin or scrotum.  Umbilical hernia. This kind of hernia develops near the navel.  Hiatal hernia. This kind of hernia causes part of the stomach to be pushed up into the chest.  Incisional hernia. This kind of hernia bulges through a scar from an abdominal surgery.  What are the causes? This condition may be caused by:  Heavy lifting.  Coughing over a long period of time.  Straining to have a bowel movement.  An incision made during an abdominal surgery.  A birth defect (congenital defect).  Excess weight or obesity.  Smoking.  Poor nutrition.  Cystic fibrosis.  Excess fluid in the abdomen.  Undescended testicles.  What are the signs or symptoms? Symptoms of a hernia include:  A lump on the abdomen. This is the first sign of a hernia. The lump may become more obvious with standing, straining, or coughing. It may get bigger over time if it is not treated or if the condition causing it is not treated.  Pain. A hernia is usually painless, but it may become painful over time if treatment  is delayed. The pain is usually dull and may get worse with standing or lifting heavy objects.  Sometimes a hernia gets tightly squeezed in the weak spot (strangulated) or stuck there (incarcerated) and causes additional symptoms. These symptoms may include:  Vomiting.  Nausea.  Constipation.  Irritability.  How is this diagnosed? A hernia may be diagnosed with:  A physical exam. During the exam your health care provider may ask you to cough or to make a  specific movement, because a hernia is usually more visible when you move.  Imaging tests. These can include: ? X-rays. ? Ultrasound. ? CT scan.  How is this treated? A hernia that is small and painless may not need to be treated. A hernia that is large or painful may be treated with surgery. Inguinal hernias may be treated with surgery to prevent incarceration or strangulation. Strangulated hernias are always treated with surgery, because lack of blood to the trapped organ or tissue can cause it to die. Surgery to treat a hernia involves pushing the bulge back into place and repairing the weak part of the abdomen. Follow these instructions at home:  Avoid straining.  Do not lift anything heavier than 10 lb (4.5 kg).  Lift with your leg muscles, not your back muscles. This helps avoid strain.  When coughing, try to cough gently.  Prevent constipation. Constipation leads to straining with bowel movements, which can make a hernia worse or cause a hernia repair to break down. You can prevent constipation by: ? Eating a high-fiber diet that includes plenty of fruits and vegetables. ? Drinking enough fluids to keep your urine clear or pale yellow. Aim to drink 6-8 glasses of water per day. ? Using a stool softener as directed by your health care provider.  Lose weight, if you are overweight.  Do not use any tobacco products, including cigarettes, chewing tobacco, or electronic cigarettes. If you need help quitting, ask your health care provider.  Keep all follow-up visits as directed by your health care provider. This is important. Your health care provider may need to monitor your condition. Contact a health care provider if:  You have swelling, redness, and pain in the affected area.  Your bowel habits change. Get help right away if:  You have a fever.  You have abdominal pain that is getting worse.  You feel nauseous or you vomit.  You cannot push the hernia back in place by  gently pressing on it while you are lying down.  The hernia: ? Changes in shape or size. ? Is stuck outside the abdomen. ? Becomes discolored. ? Feels hard or tender. This information is not intended to replace advice given to you by your health care provider. Make sure you discuss any questions you have with your health care provider. Document Released: 11/07/2005 Document Revised: 04/06/2016 Document Reviewed: 09/17/2014 Elsevier Interactive Patient Education  2017 [place] Diastasis recti is when the muscles of the abdomen (rectus abdominis muscles) become thin and separate. The result is a wider space between the right and left abdomen (abdominal) muscles. This wider space between the muscles may cause a bulge in the middle of your abdomen. You may notice this bulge when you are straining or when you sit up from a lying down position. Diastasis recti can affect men and women. It is most common among pregnant women, infants, people who are obese, and people who have had abdominal surgery. Exercise or surgical treatment may help correct it. What are  the causes? Common causes of this condition include:  Pregnancy. The growing uterus puts pressure on the abdominal muscles, which causes the muscles to separate.  Obesity. Excess fat puts pressure on abdominal muscles.  Weightlifting.  Some abdomen exercises.  Advanced age.  Genetics.  Prior abdominal surgery.  What increases the risk? This condition is more likely to develop in:  Women.  Newborns, especially newborns who are born early (prematurely).  What are the signs or symptoms? Common symptoms of this condition include:  A bulge in the middle of the abdomen. You will notice it most when you sit up or strain.  Pain in the low back, pelvis, or hips.  Constipation.  Inability to control when you urinate (urinary incontinence).  Bloating.  Poor posture.  How is this diagnosed? This condition  is diagnosed with a physical exam. Your health care provider will ask you to lie flat on your back and do a crunch or half sit-up. If you have diastasis recti, a vertical bulge will appear between your abdominal muscles in the center of your abdomen. Your health care provider will measure the gap between your muscles with one of the following:  A medical device used to measure the space between two objects (caliper).  A tape measure.  CT scan.  Ultrasound.  Finger spaces. Your health care provider will measure the space using their fingers.  How is this treated? If your muscle separation is not too large, you may not need treatment. However, if you are a woman who plans to become pregnant again, you should treat this condition before your next pregnancy. Treatment may include:  Physical therapy to strengthen and tighten your abdominal muscles.  Lifestyle changes such as weight loss and exercise.  Over-the-counter pain medicines as needed.  Surgery to correct the separation.  Follow these instructions at home: Activity  Return to your normal activities as told by your health care provider. Ask your health care provider what activities are safe for you.  When lifting weights or doing exercises using your abdominal muscles or the muscles in the center of your body that give stability (core muscles), make sure you are doing your exercises and movements correctly. Proper form can help to prevent the condition from happening again. General instructions  If you are overweight, ask your health care provider for help with weight loss. Losing even a small amount of weight can help to improve your diastasis recti.  Take over-the-counter or prescription medicines only as told by your health care provider.  Do not strain. Straining can make the separation worse. Examples of straining include: ? Pushing hard to have a bowel movement, such as due to constipation. ? Lifting heavy objects,  including children. ? Standing up and sitting down.  Take steps to prevent constipation: ? Drink enough fluid to keep your urine clear or pale yellow. ? Take over-the-counter or prescription medicines only as directed. ? Eat foods that are high in fiber, such as fresh fruits and vegetables, whole grains, and beans. ? Limit foods that are high in fat and processed sugars, such as fried and sweet foods. Contact a health care provider if:  You notice a new bulge in your abdomen. Get help right away if:  You experience severe discomfort in your abdomen.  You develop severe abdominal pain along with nausea, vomiting, or fever. Summary  Diastasis recti is when the abdomen (abdominal) muscles become thin and separate. Your abdomen will stick out because the space between your right and  left abdomen muscles has widened.  The most common symptom is a bulge in your abdomen. You will notice it most when you sit up or are straining.  This condition is diagnosed during a physical exam.  If the abdomen separation is not too big, you may choose not to have treatment. Otherwise, you may need to undergo physical therapy or surgery. This information is not intended to replace advice given to you by your health care provider. Make sure you discuss any questions you have with your health care provider. Document Released: 01/02/2017 Document Revised: 01/02/2017 Document Reviewed: 01/02/2017 Elsevier Interactive Patient Education  [employer].

## 2018-10-30 NOTE — Progress Notes (Signed)
[facility] History and Physical  Reason for Referral: Hemorrhoids/ hernia  Referring Physician: Walden Field NP    Becky [name] is a 57 y.o. female.  HPI: Ms. Becky [name] is a 57 yo with a history of chronic constipation and hemorrhoids. She has been followed by GI for her constipation and hemorrhoids with related anal pain and rectal bleeding. She ahs undergone colonoscopy in the past with polyp removal and plans for repeat in the upcoming weeks. She continues to have prolapsing hemorrhoids and says she has discomfort in the area and bleeding/ drainage. She is very self conscious regarding the hemorrhoids and does not want me to examine her today.   She has had the hemorrhoids for years. She has been on Linzess in the past and says her stools go from hard to soft and that she stools every few days.  She also has a history significant for cocaine abuse and alcohol abuse. She drinks over 40 ounces a day of liquor and beer. She does cocaine on occasion.   When I ask her about her social history she is very tearful and reports being ashamed of her alcohol and cocaine use.  She also notes a bulging in her anterior abdominal wall. This causes her some discomfort at times.   Past Medical History:  Diagnosis Date  . Bipolar affective ([place])   . Bronchitis   . Cocaine abuse ([place])   . Depression   . Headache   . Hemorrhoids   . Hypertension   . MDD (major depressive disorder)     Past Surgical History:  Procedure Laterality Date  . COLONOSCOPY WITH PROPOFOL N/A 06/23/2015   SLF: 1. Rectal bleeding/pain due to hemorrhoids2. one large and one small colorectal polyp removed.   . cysts removed from armpits bilaterally    . POLYPECTOMY N/A 06/23/2015   Procedure: POLYPECTOMY;  Surgeon: Danie Binder, MD;  Location: [facility];  Service: Endoscopy;  Laterality: N/A;  descending colon    Family History  Problem Relation Age of Onset  . Heart failure Mother   . Coronary artery disease  Mother   . Heart failure Father   . Coronary artery disease Father   . Colon cancer Neg Hx   . Mental illness Neg Hx     Social History   Tobacco Use  . Smoking status: Current Some Day Smoker    Packs/day: 0.50    Types: Cigarettes  . Smokeless tobacco: Never Used  . Tobacco comment: vague reports of smoking intermittently   Substance Use Topics  . Alcohol use: Yes    Comment: 40ozs per day  . Drug use: Yes    Types: Benzodiazepines, Marijuana, Cocaine    Medications: I have reviewed the patient's current medications. Allergies as of 10/30/2018   No Known Allergies     Medication List        Accurate as of 10/30/18 10:18 AM. Always use your most recent med list.          acetaminophen 325 MG tablet Commonly known as:  TYLENOL Take 650 mg by mouth every 6 (six) hours as needed.   albuterol 108 (90 Base) MCG/ACT inhaler Commonly known as:  PROVENTIL HFA;VENTOLIN HFA Inhale 1-2 puffs into the lungs every 6 (six) hours as needed for wheezing or shortness of breath.   amLODipine 5 MG tablet Commonly known as:  NORVASC Take 1 tablet (5 mg total) by mouth daily.   ibuprofen 600 MG tablet Commonly known as:  ADVIL,MOTRIN  Take 1 tablet (600 mg total) by mouth every 8 (eight) hours as needed for fever, headache or moderate pain.   pantoprazole 40 MG tablet Commonly known as:  PROTONIX Take 40 mg by mouth daily.   pravastatin 20 MG tablet Commonly known as:  PRAVACHOL Take 20 mg by mouth daily.        ROS:  A comprehensive review of systems was negative except for: Respiratory: positive for cough, wheezing and SOB Gastrointestinal: positive for abdominal pain, nausea and reflux symptoms Genitourinary: positive for frequency and retention Musculoskeletal: positive for back pain, neck pain and stiff joints Endocrine: positive for temperature intolerance/ tired  No Chest pain or SOB with exertion   Blood pressure (!) 148/102, pulse 71, temperature (!) 97.1 F  (36.2 C), temperature source Temporal, resp. rate 18, weight 164 lb 12.8 oz (74.8 kg). Physical Exam  Constitutional: [name], place, and time. She appears well-developed.  HENT:  Head: Normocephalic and atraumatic.  Eyes: Pupils are equal, round, and reactive to light. EOM are normal.  Neck: Normal range of motion.  Cardiovascular: Normal rate and regular rhythm.  Pulmonary/Chest: Effort normal and breath sounds normal.  Abdominal: Soft. She exhibits no distension. There is no tenderness.  Epigastric area with some degree of diastasis recti extending about 6 cm cephalad from the umbilicus, fascial edges appreciated, which likely indicate some degree of epigastric hernia associated with the diastasis   Genitourinary:  Genitourinary Comments: Rectal exam with some decreased tone, external skin tags, prolapsing mucosal/ hemorrhoidal tissue, some irritation chronically  Musculoskeletal: Normal range of motion. She exhibits no edema.  Neurological: She is alert and oriented to person, place, and time.  Skin: Skin is warm and dry.  Psychiatric: She has a normal mood and affect. Her behavior is normal. Judgment and thought content normal.  Vitals reviewed.   Results: None   Assessment & Plan:  Becky [name] is a 57 y.o. female with grade 4 hemorrhoids and some chronic mucosal irritation. It does not look like condyloma but it is difficult to exclude without tissue sample.  Diastasis recti with some degree of an epigastric hernia within the diastasis.  Patient very adamant about wanting her hemorrhoids fixed but wants to think about it.   -Discussed the hemorrhoids and the hernia and that these cannot be fixed at the same time.  -Discussed alcohol and drug use and need for abstinence for anesthesia, and potential for cases to be canceled if she is drinking/ on drugs recently -Discussed need for narcotic pain medication after and need to be off alcohol and drugs  -Hemorrhoid  surgery for external hemorrhoids is very painful. The pain and discomfort that the patient is having currently will be magnified after the surgery for at least 2-3 weeks.  The patient will have feelings of constant pressure and pain in the area from the swelling and removal of the anoderm (skin around the anus). The internal hemorrhoids are not painful to remove because the same nerves are not involved, and the sensation is different, but removal of any external hemorrhoids will cause significant discomfort. They will need at least 4-6 weeks to recover from the surgery, and should not expect to be able to feel back to "normal for 6-8 weeks."    The risk of hemorrhoid surgery include bleeding, risk of infection although rare, and the risk of narrowing the anal canal if too much tissue is removed. Given this risk, it is likely that only the 2 largest  hemorrhoid columns would be removed during the initial surgery.  We have also discussed the risk of incontinence after surgery if the muscles were injured, and although this is rare that it can happen and is another reason to limit the amount of hemorrhoids removed.    She wants to follow up in 1 month to discuss things further and think about her options.   All questions were answered to the satisfaction of the patient.   Virl Cagey 10/30/2018, 10:18 AM

## 2018-11-29 ENCOUNTER — Ambulatory Visit: Payer: Medicaid Other | Admitting: General Surgery

## 2018-12-11 ENCOUNTER — Ambulatory Visit (INDEPENDENT_AMBULATORY_CARE_PROVIDER_SITE_OTHER): Payer: Medicaid Other | Admitting: General Surgery

## 2018-12-11 ENCOUNTER — Encounter: Payer: Self-pay | Admitting: General Surgery

## 2018-12-11 VITALS — BP 144/105 | HR 70 | Temp 96.6°F | Resp 20 | Wt 165.4 lb

## 2018-12-11 DIAGNOSIS — K644 Residual hemorrhoidal skin tags: Secondary | ICD-10-CM | POA: Diagnosis not present

## 2018-12-11 DIAGNOSIS — K643 Fourth degree hemorrhoids: Secondary | ICD-10-CM

## 2018-12-11 NOTE — Progress Notes (Signed)
[facility] Note   HPI:  58 y.o. Female presents to clinic for follow-up evaluation of for her hemorrhoids. Patient reports she wants to have surgery in April. She is still having issues with discomfort, bleeding and hygiene.   Review of Systems:  Some discomfort and bleeding hemorrhoids  All other review of systems: otherwise negative   Vital Signs:  BP (!) 144/105 (BP Location: Left Arm, Patient Position: Sitting, Cuff Size: Normal)   Pulse 70   Temp (!) 96.6 F (35.9 C) (Temporal)   Resp 20   Wt 165 lb 6.4 oz (75 kg)   BMI 25.15 kg/m    Physical Exam:  Physical Exam Vitals signs reviewed.  HENT:     Head: Normocephalic.  Neck:     Musculoskeletal: Normal range of motion.  Cardiovascular:     Rate and Rhythm: Normal rate.  Pulmonary:     Effort: Pulmonary effort is normal.  Genitourinary:    Comments: Prolapsing hemorrhoids with some mucosal irritation  Neurological:     General: No focal deficit present.     Mental Status: She is alert.     Assessment:  58 y.o. yo Female with hemorrhoids. She wants to have surgery but does not want to pursue this until April.   Plan:  - Return April to schedule for surgery     All of the above recommendations were discussed with the patient, and all of patient's questions were answered to her expressed satisfaction.  Curlene Labrum, MD [facility] [address] [place], [place] [address] [phone] (office)

## 2018-12-11 NOTE — Patient Instructions (Signed)
Call at the End of March for an Appointment in April. We will schedule surgery for the end of April.

## 2018-12-14 NOTE — Patient Instructions (Signed)
Becky [name]  12/14/2018     @PREFPERIOPPHARMACY @   Your procedure is scheduled on  12/25/2018   Report to [facility] at  1130   A.M.  Call this number if you have problems the morning of surgery:  [phone]   Remember:  Follow the diet and prep instructions given to you by Dr Nona Dell                      Take these medicines the morning of surgery with A SIP OF WATER  Amlodipine, protonix. Use your inhaler before you come.    Do not wear jewelry, make-up or nail polish.  Do not wear lotions, powders, or perfumes, or deodorant.  Do not shave 48 hours prior to surgery.  Men may shave face and neck.  Do not bring valuables to the hospital.  [facility] is not responsible for any belongings or valuables.  Contacts, dentures or bridgework may not be worn into surgery.  Leave your suitcase in the car.  After surgery it may be brought to your room.  For patients admitted to the hospital, discharge time will be determined by your treatment team.  Patients discharged the day of surgery will not be allowed to drive home.   Name and phone number of your driver:   family Special instructions:  None  Please read over the following fact sheets that you were given. Anesthesia Post-op Instructions and Care and Recovery After Surgery       Colonoscopy, Adult A colonoscopy is an exam to look at the large intestine. It is done to check for problems, such as:  Lumps (tumors).  Growths (polyps).  Swelling (inflammation).  Bleeding. What happens before the procedure? Eating and drinking Follow instructions from your doctor about eating and drinking. These instructions may include:  A few days before the procedure - follow a low-fiber diet. ? Avoid nuts. ? Avoid seeds. ? Avoid dried fruit. ? Avoid raw fruits. ? Avoid vegetables.  1-3 days before the procedure - follow a clear liquid diet. Avoid liquids that have red or purple dye. Drink only clear liquids,  such as: ? Clear broth or bouillon. ? Black coffee or tea. ? Clear juice. ? Clear soft drinks or sports drinks. ? Gelatin dessert. ? Popsicles.  On the day of the procedure - do not eat or drink anything during the 2 hours before the procedure. Up to 2 hours before the procedure, you may continue to drink clear liquids, such as water or clear fruit juice.  Bowel prep If you were prescribed an oral bowel prep:  Take it as told by your doctor. Starting the day before your procedure, you will need to drink a lot of liquid. The liquid will cause you to poop (have bowel movements) until your poop is almost clear or light green.  To clean out your colon, you may also be given: ? Laxative medicines. ? Instructions about how to use an enema.  If your skin or butt gets irritated from diarrhea, you may: ? Wipe the area with wipes that have medicine in them, such as adult wet wipes with aloe and vitamin E. ? Put something on your skin that soothes the area, such as petroleum jelly.  If you throw up (vomit) while drinking the bowel prep, take a break for up to 60 minutes. Then begin the bowel prep again. If you keep throwing up and  you cannot take the bowel prep without throwing up, call your doctor. General instructions  Ask your doctor about: ? Changing or stopping your normal medicines. This is important if you take iron pills, diabetes medicines, or blood thinners. ? Taking medicines such as aspirin and ibuprofen. These medicines can thin your blood. Do not take these medicines unless your doctor tells you to take them.  Plan to have someone take you home from the hospital or clinic. What happens during the procedure?   An IV tube may be put into one of your veins.  You will be given medicine to help you relax (sedative).  To reduce your risk of infection: ? Your doctors will wash their hands. ? Your anal area will be washed with soap.  You will be asked to lie on your side with  your knees bent.  Your doctor will get a long, thin, flexible tube ready. The tube will have a camera and a light on the end.  The tube will be put into your anus.  The tube will be gently put into your large intestine.  Air will be delivered into your large intestine to keep it open. You may feel some pressure or cramping.  The camera will be used to take photos.  A small tissue sample may be removed for testing (biopsy).  If small growths are found, your doctor may remove them and have them checked for cancer.  The tube that was put into your anus will be slowly removed. The procedure may vary among doctors and hospitals. What happens after the procedure?  Your doctor will check on you often until the medicines you were given have worn off.  Do not drive for 24 hours after the procedure.  You may have a small amount of blood in your poop.  You may pass gas.  You may have mild cramps or bloating in your belly (abdomen).  It is up to you to get the results of your procedure. Ask your doctor, or the department performing the procedure, when your results will be ready. Summary  A colonoscopy is an exam to look at the large intestine.  Follow instructions from your doctor about eating and drinking before the procedure.  If you were prescribed an oral bowel prep to clean out your colon, take it as told by your doctor.  Your doctor will check on you often until the medicines you were given have worn off.  Plan to have someone take you home from the hospital or clinic. This information is not intended to replace advice given to you by your health care provider. Make sure you discuss any questions you have with your health care provider. Document Released: 12/10/2010 Document Revised: 09/06/2017 Document Reviewed: 01/19/2016 Elsevier Interactive Patient Education  2019 [employer]  Colonoscopy, Adult, Care After This sheet gives you information about how to care for  yourself after your procedure. Your health care provider may also give you more specific instructions. If you have problems or questions, contact your health care provider. What can I expect after the procedure? After the procedure, it is common to have:  A small amount of blood in your stool for 24 hours after the procedure.  Some gas.  Mild abdominal cramping or bloating. Follow these instructions at home: General instructions  For the first 24 hours after the procedure: ? Do not drive or use machinery. ? Do not sign important documents. ? Do not drink alcohol. ? Do your regular daily activities  at a slower pace than normal. ? Eat soft, easy-to-digest foods.  Take over-the-counter or prescription medicines only as told by your health care provider. Relieving cramping and bloating   Try walking around when you have cramps or feel bloated.  Apply heat to your abdomen as told by your health care provider. Use a heat source that your health care provider recommends, such as a moist heat pack or a heating pad. ? Place a towel between your skin and the heat source. ? Leave the heat on for 20-30 minutes. ? Remove the heat if your skin turns bright red. This is especially important if you are unable to feel pain, heat, or cold. You may have a greater risk of getting burned. Eating and drinking   Drink enough fluid to keep your urine pale yellow.  Resume your normal diet as instructed by your health care provider. Avoid heavy or fried foods that are hard to digest.  Avoid drinking alcohol for as long as instructed by your health care provider. Contact a health care provider if:  You have blood in your stool 2-3 days after the procedure. Get help right away if:  You have more than a small spotting of blood in your stool.  You pass large blood clots in your stool.  Your abdomen is swollen.  You have nausea or vomiting.  You have a fever.  You have increasing abdominal pain  that is not relieved with medicine. Summary  After the procedure, it is common to have a small amount of blood in your stool. You may also have mild abdominal cramping and bloating.  For the first 24 hours after the procedure, do not drive or use machinery, sign important documents, or drink alcohol.  Contact your health care provider if you have a lot of blood in your stool, nausea or vomiting, a fever, or increased abdominal pain. This information is not intended to replace advice given to you by your health care provider. Make sure you discuss any questions you have with your health care provider. Document Released: 06/21/2004 Document Revised: 08/30/2017 Document Reviewed: 01/19/2016 Elsevier Interactive Patient Education  2019 [place] Anesthesia is a term that refers to techniques, procedures, and medicines that help a person stay safe and comfortable during a medical procedure. Monitored anesthesia care, or sedation, is one type of anesthesia. Your anesthesia specialist may recommend sedation if you will be having a procedure that does not require you to be unconscious, such as:  Cataract surgery.  A dental procedure.  A biopsy.  A colonoscopy. During the procedure, you may receive a medicine to help you relax (sedative). There are three levels of sedation:  Mild sedation. At this level, you may feel awake and relaxed. You will be able to follow directions.  Moderate sedation. At this level, you will be sleepy. You may not remember the procedure.  Deep sedation. At this level, you will be asleep. You will not remember the procedure. The more medicine you are given, the deeper your level of sedation will be. Depending on how you respond to the procedure, the anesthesia specialist may change your level of sedation or the type of anesthesia to fit your needs. An anesthesia specialist will monitor you closely during the procedure. Let your health care  provider know about:  Any allergies you have.  All medicines you are taking, including vitamins, herbs, eye drops, creams, and over-the-counter medicines.  Any use of steroids (by mouth or as a  cream).  Any problems you or family members have had with sedatives and anesthetic medicines.  Any blood disorders you have.  Any surgeries you have had.  Any medical conditions you have, such as sleep apnea.  Whether you are pregnant or may be pregnant.  Any use of cigarettes, alcohol, or street drugs. What are the risks? Generally, this is a safe procedure. However, problems may occur, including:  Getting too much medicine (oversedation).  Nausea.  Allergic reaction to medicines.  Trouble breathing. If this happens, a breathing tube may be used to help with breathing. It will be removed when you are awake and breathing on your own.  Heart trouble.  Lung trouble. Before the procedure Staying hydrated Follow instructions from your health care provider about hydration, which may include:  Up to 2 hours before the procedure - you may continue to drink clear liquids, such as water, clear fruit juice, black coffee, and plain tea. Eating and drinking restrictions Follow instructions from your health care provider about eating and drinking, which may include:  8 hours before the procedure - stop eating heavy meals or foods such as meat, fried foods, or fatty foods.  6 hours before the procedure - stop eating light meals or foods, such as toast or cereal.  6 hours before the procedure - stop drinking milk or drinks that contain milk.  2 hours before the procedure - stop drinking clear liquids. Medicines Ask your health care provider about:  Changing or stopping your regular medicines. This is especially important if you are taking diabetes medicines or blood thinners.  Taking medicines such as aspirin and ibuprofen. These medicines can thin your blood. Do not take these medicines  before your procedure if your health care provider instructs you not to. Tests and exams  You will have a physical exam.  You may have blood tests done to show: ? How well your kidneys and liver are working. ? How well your blood can clot. General instructions  Plan to have someone take you home from the hospital or clinic.  If you will be going home right after the procedure, plan to have someone with you for 24 hours.  What happens during the procedure?  Your blood pressure, heart rate, breathing, level of pain and overall condition will be monitored.  An IV tube will be inserted into one of your veins.  Your anesthesia specialist will give you medicines as needed to keep you comfortable during the procedure. This may mean changing the level of sedation.  The procedure will be performed. After the procedure  Your blood pressure, heart rate, breathing rate, and blood oxygen level will be monitored until the medicines you were given have worn off.  Do not drive for 24 hours if you received a sedative.  You may: ? Feel sleepy, clumsy, or nauseous. ? Feel forgetful about what happened after the procedure. ? Have a sore throat if you had a breathing tube during the procedure. ? Vomit. This information is not intended to replace advice given to you by your health care provider. Make sure you discuss any questions you have with your health care provider. Document Released: 08/03/2005 Document Revised: 04/15/2016 Document Reviewed: 02/28/2016 Elsevier Interactive Patient Education  2019 [place], Care After These instructions provide you with information about caring for yourself after your procedure. Your health care provider may also give you more specific instructions. Your treatment has been planned according to current medical practices, but problems sometimes  occur. Call your health care provider if you have any problems or questions after your  procedure. What can I expect after the procedure? After your procedure, you may:  Feel sleepy for several hours.  Feel clumsy and have poor balance for several hours.  Feel forgetful about what happened after the procedure.  Have poor judgment for several hours.  Feel nauseous or vomit.  Have a sore throat if you had a breathing tube during the procedure. Follow these instructions at home: For at least 24 hours after the procedure:      Have a responsible adult stay with you. It is important to have someone help care for you until you are awake and alert.  Rest as needed.  Do not: ? Participate in activities in which you could fall or become injured. ? Drive. ? Use heavy machinery. ? Drink alcohol. ? Take sleeping pills or medicines that cause drowsiness. ? Make important decisions or sign legal documents. ? Take care of children on your own. Eating and drinking  Follow the diet that is recommended by your health care provider.  If you vomit, drink water, juice, or soup when you can drink without vomiting.  Make sure you have little or no nausea before eating solid foods. General instructions  Take over-the-counter and prescription medicines only as told by your health care provider.  If you have sleep apnea, surgery and certain medicines can increase your risk for breathing problems. Follow instructions from your health care provider about wearing your sleep device: ? Anytime you are sleeping, including during daytime naps. ? While taking prescription pain medicines, sleeping medicines, or medicines that make you drowsy.  If you smoke, do not smoke without supervision.  Keep all follow-up visits as told by your health care provider. This is important. Contact a health care provider if:  You keep feeling nauseous or you keep vomiting.  You feel light-headed.  You develop a rash.  You have a fever. Get help right away if:  You have trouble  breathing. Summary  For several hours after your procedure, you may feel sleepy and have poor judgment.  Have a responsible adult stay with you for at least 24 hours or until you are awake and alert. This information is not intended to replace advice given to you by your health care provider. Make sure you discuss any questions you have with your health care provider. Document Released: 02/28/2016 Document Revised: 06/23/2017 Document Reviewed: 02/28/2016 Elsevier Interactive Patient Education  2019 [employer].

## 2018-12-18 ENCOUNTER — Ambulatory Visit (HOSPITAL_COMMUNITY)
Admission: RE | Admit: 2018-12-18 | Discharge: 2018-12-18 | Disposition: A | Discharge status: Home / Self Care | Payer: Medicaid Other | Source: Ambulatory Visit | Attending: Gastroenterology | Admitting: Gastroenterology

## 2018-12-20 NOTE — Patient Instructions (Signed)
Becky [name]  12/20/2018     @PREFPERIOPPHARMACY @   Your procedure is scheduled on  12/25/2018.  Report to [facility] at  1100   A.M.  Call this number if you have problems the morning of surgery:  [phone]   Remember:  Follow the diet and prep instructions given to you by Dr Nona Dell office.                      Take these medicines the morning of surgery with A SIP OF WATER  Amlodipine, protonix. Use your inhalers before you come.    Do not wear jewelry, make-up or nail polish.  Do not wear lotions, powders, or perfumes, or deodorant.  Do not shave 48 hours prior to surgery.  Men may shave face and neck.  Do not bring valuables to the hospital.  [facility] is not responsible for any belongings or valuables.  Contacts, dentures or bridgework may not be worn into surgery.  Leave your suitcase in the car.  After surgery it may be brought to your room.  For patients admitted to the hospital, discharge time will be determined by your treatment team.  Patients discharged the day of surgery will not be allowed to drive home.   Name and phone number of your driver:   family Special instructions:  None  Please read over the following fact sheets that you were given. Anesthesia Post-op Instructions and Care and Recovery After Surgery       Colonoscopy, Adult A colonoscopy is an exam to look at the large intestine. It is done to check for problems, such as:  Lumps (tumors).  Growths (polyps).  Swelling (inflammation).  Bleeding. What happens before the procedure? Eating and drinking Follow instructions from your doctor about eating and drinking. These instructions may include:  A few days before the procedure - follow a low-fiber diet. ? Avoid nuts. ? Avoid seeds. ? Avoid dried fruit. ? Avoid raw fruits. ? Avoid vegetables.  1-3 days before the procedure - follow a clear liquid diet. Avoid liquids that have red or purple dye. Drink only clear  liquids, such as: ? Clear broth or bouillon. ? Black coffee or tea. ? Clear juice. ? Clear soft drinks or sports drinks. ? Gelatin dessert. ? Popsicles.  On the day of the procedure - do not eat or drink anything during the 2 hours before the procedure. Up to 2 hours before the procedure, you may continue to drink clear liquids, such as water or clear fruit juice.  Bowel prep If you were prescribed an oral bowel prep:  Take it as told by your doctor. Starting the day before your procedure, you will need to drink a lot of liquid. The liquid will cause you to poop (have bowel movements) until your poop is almost clear or light green.  To clean out your colon, you may also be given: ? Laxative medicines. ? Instructions about how to use an enema.  If your skin or butt gets irritated from diarrhea, you may: ? Wipe the area with wipes that have medicine in them, such as adult wet wipes with aloe and vitamin E. ? Put something on your skin that soothes the area, such as petroleum jelly.  If you throw up (vomit) while drinking the bowel prep, take a break for up to 60 minutes. Then begin the bowel prep again. If you keep throwing up and  you cannot take the bowel prep without throwing up, call your doctor. General instructions  Ask your doctor about: ? Changing or stopping your normal medicines. This is important if you take iron pills, diabetes medicines, or blood thinners. ? Taking medicines such as aspirin and ibuprofen. These medicines can thin your blood. Do not take these medicines unless your doctor tells you to take them.  Plan to have someone take you home from the hospital or clinic. What happens during the procedure?   An IV tube may be put into one of your veins.  You will be given medicine to help you relax (sedative).  To reduce your risk of infection: ? Your doctors will wash their hands. ? Your anal area will be washed with soap.  You will be asked to lie on your side  with your knees bent.  Your doctor will get a long, thin, flexible tube ready. The tube will have a camera and a light on the end.  The tube will be put into your anus.  The tube will be gently put into your large intestine.  Air will be delivered into your large intestine to keep it open. You may feel some pressure or cramping.  The camera will be used to take photos.  A small tissue sample may be removed for testing (biopsy).  If small growths are found, your doctor may remove them and have them checked for cancer.  The tube that was put into your anus will be slowly removed. The procedure may vary among doctors and hospitals. What happens after the procedure?  Your doctor will check on you often until the medicines you were given have worn off.  Do not drive for 24 hours after the procedure.  You may have a small amount of blood in your poop.  You may pass gas.  You may have mild cramps or bloating in your belly (abdomen).  It is up to you to get the results of your procedure. Ask your doctor, or the department performing the procedure, when your results will be ready. Summary  A colonoscopy is an exam to look at the large intestine.  Follow instructions from your doctor about eating and drinking before the procedure.  If you were prescribed an oral bowel prep to clean out your colon, take it as told by your doctor.  Your doctor will check on you often until the medicines you were given have worn off.  Plan to have someone take you home from the hospital or clinic. This information is not intended to replace advice given to you by your health care provider. Make sure you discuss any questions you have with your health care provider. Document Released: 12/10/2010 Document Revised: 09/06/2017 Document Reviewed: 01/19/2016 Elsevier Interactive Patient Education  2019 [employer]  Colonoscopy, Adult, Care After This sheet gives you information about how to care for  yourself after your procedure. Your health care provider may also give you more specific instructions. If you have problems or questions, contact your health care provider. What can I expect after the procedure? After the procedure, it is common to have:  A small amount of blood in your stool for 24 hours after the procedure.  Some gas.  Mild abdominal cramping or bloating. Follow these instructions at home: General instructions  For the first 24 hours after the procedure: ? Do not drive or use machinery. ? Do not sign important documents. ? Do not drink alcohol. ? Do your regular daily activities  at a slower pace than normal. ? Eat soft, easy-to-digest foods.  Take over-the-counter or prescription medicines only as told by your health care provider. Relieving cramping and bloating   Try walking around when you have cramps or feel bloated.  Apply heat to your abdomen as told by your health care provider. Use a heat source that your health care provider recommends, such as a moist heat pack or a heating pad. ? Place a towel between your skin and the heat source. ? Leave the heat on for 20-30 minutes. ? Remove the heat if your skin turns bright red. This is especially important if you are unable to feel pain, heat, or cold. You may have a greater risk of getting burned. Eating and drinking   Drink enough fluid to keep your urine pale yellow.  Resume your normal diet as instructed by your health care provider. Avoid heavy or fried foods that are hard to digest.  Avoid drinking alcohol for as long as instructed by your health care provider. Contact a health care provider if:  You have blood in your stool 2-3 days after the procedure. Get help right away if:  You have more than a small spotting of blood in your stool.  You pass large blood clots in your stool.  Your abdomen is swollen.  You have nausea or vomiting.  You have a fever.  You have increasing abdominal pain  that is not relieved with medicine. Summary  After the procedure, it is common to have a small amount of blood in your stool. You may also have mild abdominal cramping and bloating.  For the first 24 hours after the procedure, do not drive or use machinery, sign important documents, or drink alcohol.  Contact your health care provider if you have a lot of blood in your stool, nausea or vomiting, a fever, or increased abdominal pain. This information is not intended to replace advice given to you by your health care provider. Make sure you discuss any questions you have with your health care provider. Document Released: 06/21/2004 Document Revised: 08/30/2017 Document Reviewed: 01/19/2016 Elsevier Interactive Patient Education  2019 [place] Anesthesia is a term that refers to techniques, procedures, and medicines that help a person stay safe and comfortable during a medical procedure. Monitored anesthesia care, or sedation, is one type of anesthesia. Your anesthesia specialist may recommend sedation if you will be having a procedure that does not require you to be unconscious, such as:  Cataract surgery.  A dental procedure.  A biopsy.  A colonoscopy. During the procedure, you may receive a medicine to help you relax (sedative). There are three levels of sedation:  Mild sedation. At this level, you may feel awake and relaxed. You will be able to follow directions.  Moderate sedation. At this level, you will be sleepy. You may not remember the procedure.  Deep sedation. At this level, you will be asleep. You will not remember the procedure. The more medicine you are given, the deeper your level of sedation will be. Depending on how you respond to the procedure, the anesthesia specialist may change your level of sedation or the type of anesthesia to fit your needs. An anesthesia specialist will monitor you closely during the procedure. Let your health care  provider know about:  Any allergies you have.  All medicines you are taking, including vitamins, herbs, eye drops, creams, and over-the-counter medicines.  Any use of steroids (by mouth or as a  cream).  Any problems you or family members have had with sedatives and anesthetic medicines.  Any blood disorders you have.  Any surgeries you have had.  Any medical conditions you have, such as sleep apnea.  Whether you are pregnant or may be pregnant.  Any use of cigarettes, alcohol, or street drugs. What are the risks? Generally, this is a safe procedure. However, problems may occur, including:  Getting too much medicine (oversedation).  Nausea.  Allergic reaction to medicines.  Trouble breathing. If this happens, a breathing tube may be used to help with breathing. It will be removed when you are awake and breathing on your own.  Heart trouble.  Lung trouble. Before the procedure Staying hydrated Follow instructions from your health care provider about hydration, which may include:  Up to 2 hours before the procedure - you may continue to drink clear liquids, such as water, clear fruit juice, black coffee, and plain tea. Eating and drinking restrictions Follow instructions from your health care provider about eating and drinking, which may include:  8 hours before the procedure - stop eating heavy meals or foods such as meat, fried foods, or fatty foods.  6 hours before the procedure - stop eating light meals or foods, such as toast or cereal.  6 hours before the procedure - stop drinking milk or drinks that contain milk.  2 hours before the procedure - stop drinking clear liquids. Medicines Ask your health care provider about:  Changing or stopping your regular medicines. This is especially important if you are taking diabetes medicines or blood thinners.  Taking medicines such as aspirin and ibuprofen. These medicines can thin your blood. Do not take these medicines  before your procedure if your health care provider instructs you not to. Tests and exams  You will have a physical exam.  You may have blood tests done to show: ? How well your kidneys and liver are working. ? How well your blood can clot. General instructions  Plan to have someone take you home from the hospital or clinic.  If you will be going home right after the procedure, plan to have someone with you for 24 hours.  What happens during the procedure?  Your blood pressure, heart rate, breathing, level of pain and overall condition will be monitored.  An IV tube will be inserted into one of your veins.  Your anesthesia specialist will give you medicines as needed to keep you comfortable during the procedure. This may mean changing the level of sedation.  The procedure will be performed. After the procedure  Your blood pressure, heart rate, breathing rate, and blood oxygen level will be monitored until the medicines you were given have worn off.  Do not drive for 24 hours if you received a sedative.  You may: ? Feel sleepy, clumsy, or nauseous. ? Feel forgetful about what happened after the procedure. ? Have a sore throat if you had a breathing tube during the procedure. ? Vomit. This information is not intended to replace advice given to you by your health care provider. Make sure you discuss any questions you have with your health care provider. Document Released: 08/03/2005 Document Revised: 04/15/2016 Document Reviewed: 02/28/2016 Elsevier Interactive Patient Education  2019 [place], Care After These instructions provide you with information about caring for yourself after your procedure. Your health care provider may also give you more specific instructions. Your treatment has been planned according to current medical practices, but problems sometimes  occur. Call your health care provider if you have any problems or questions after your  procedure. What can I expect after the procedure? After your procedure, you may:  Feel sleepy for several hours.  Feel clumsy and have poor balance for several hours.  Feel forgetful about what happened after the procedure.  Have poor judgment for several hours.  Feel nauseous or vomit.  Have a sore throat if you had a breathing tube during the procedure. Follow these instructions at home: For at least 24 hours after the procedure:      Have a responsible adult stay with you. It is important to have someone help care for you until you are awake and alert.  Rest as needed.  Do not: ? Participate in activities in which you could fall or become injured. ? Drive. ? Use heavy machinery. ? Drink alcohol. ? Take sleeping pills or medicines that cause drowsiness. ? Make important decisions or sign legal documents. ? Take care of children on your own. Eating and drinking  Follow the diet that is recommended by your health care provider.  If you vomit, drink water, juice, or soup when you can drink without vomiting.  Make sure you have little or no nausea before eating solid foods. General instructions  Take over-the-counter and prescription medicines only as told by your health care provider.  If you have sleep apnea, surgery and certain medicines can increase your risk for breathing problems. Follow instructions from your health care provider about wearing your sleep device: ? Anytime you are sleeping, including during daytime naps. ? While taking prescription pain medicines, sleeping medicines, or medicines that make you drowsy.  If you smoke, do not smoke without supervision.  Keep all follow-up visits as told by your health care provider. This is important. Contact a health care provider if:  You keep feeling nauseous or you keep vomiting.  You feel light-headed.  You develop a rash.  You have a fever. Get help right away if:  You have trouble  breathing. Summary  For several hours after your procedure, you may feel sleepy and have poor judgment.  Have a responsible adult stay with you for at least 24 hours or until you are awake and alert. This information is not intended to replace advice given to you by your health care provider. Make sure you discuss any questions you have with your health care provider. Document Released: 02/28/2016 Document Revised: 06/23/2017 Document Reviewed: 02/28/2016 Elsevier Interactive Patient Education  2019 [employer].

## 2018-12-24 ENCOUNTER — Encounter (HOSPITAL_COMMUNITY)
Admission: RE | Admit: 2018-12-24 | Discharge: 2018-12-24 | Disposition: A | Discharge status: Home / Self Care | Payer: Medicaid Other | Source: Ambulatory Visit | Attending: Gastroenterology | Admitting: Gastroenterology

## 2018-12-24 ENCOUNTER — Telehealth: Payer: Self-pay | Admitting: *Deleted

## 2018-12-24 ENCOUNTER — Encounter (HOSPITAL_COMMUNITY): Payer: Self-pay

## 2018-12-24 NOTE — Telephone Encounter (Signed)
Becky [name] from day surgery called stating patient did not show up for pre-op. They called patient and told them to cancel. She didn't even pick up her prep or prepped for TCS. She advised Becky [name] she would call us back to r/s. FYI to EG

## 2018-12-25 ENCOUNTER — Encounter (HOSPITAL_COMMUNITY): Admission: RE | Payer: Self-pay | Source: Home / Self Care

## 2018-12-25 ENCOUNTER — Ambulatory Visit (HOSPITAL_COMMUNITY): Admission: RE | Admit: 2018-12-25 | Payer: Medicaid Other | Source: Home / Self Care | Admitting: Gastroenterology

## 2018-12-25 SURGERY — COLONOSCOPY WITH PROPOFOL
Anesthesia: Monitor Anesthesia Care

## 2018-12-25 NOTE — Telephone Encounter (Signed)
Noted, no further recommendations at this time. 

## 2019-01-02 ENCOUNTER — Encounter (HOSPITAL_COMMUNITY): Payer: Self-pay

## 2019-01-02 ENCOUNTER — Other Ambulatory Visit: Payer: Self-pay

## 2019-01-02 ENCOUNTER — Emergency Department (HOSPITAL_COMMUNITY)
Admission: EM | Admit: 2019-01-02 | Discharge: 2019-01-02 | Disposition: A | Discharge status: Home / Self Care | Payer: Medicaid Other | Attending: Emergency Medicine | Admitting: Emergency Medicine

## 2019-01-02 DIAGNOSIS — F17210 Nicotine dependence, cigarettes, uncomplicated: Secondary | ICD-10-CM | POA: Insufficient documentation

## 2019-01-02 DIAGNOSIS — I10 Essential (primary) hypertension: Secondary | ICD-10-CM | POA: Diagnosis not present

## 2019-01-02 DIAGNOSIS — N3001 Acute cystitis with hematuria: Secondary | ICD-10-CM | POA: Diagnosis not present

## 2019-01-02 DIAGNOSIS — Z79899 Other long term (current) drug therapy: Secondary | ICD-10-CM | POA: Insufficient documentation

## 2019-01-02 DIAGNOSIS — R300 Dysuria: Secondary | ICD-10-CM | POA: Diagnosis present

## 2019-01-02 LAB — URINALYSIS, ROUTINE W REFLEX MICROSCOPIC
Bilirubin Urine: NEGATIVE
Glucose, UA: NEGATIVE mg/dL
Ketones, ur: NEGATIVE mg/dL
Nitrite: POSITIVE — AB
Protein, ur: NEGATIVE mg/dL
Specific Gravity, Urine: 1.019 (ref 1.005–1.030)
pH: 5 (ref 5.0–8.0)

## 2019-01-02 MED ORDER — FLUCONAZOLE 200 MG PO TABS: ORAL_TABLET | ORAL | 0 refills | Status: DC

## 2019-01-02 MED ORDER — CEPHALEXIN 500 MG PO CAPS: 500.0000 mg | ORAL_CAPSULE | Freq: Four times a day (QID) | ORAL | 0 refills | Status: DC

## 2019-01-02 NOTE — Discharge Instructions (Addendum)
Is important to drink plenty of water and you may also drink cranberry juice.  Take the antibiotic as directed until its finished.  Then take the Diflucan tablet.  You may take a second Diflucan tablet in 7 days if needed.  Follow-up with your doctor or return here if needed.

## 2019-01-02 NOTE — ED Provider Notes (Signed)
[facility] EMERGENCY DEPARTMENT Provider Note   CSN: [id number] Arrival date & time: 01/02/19  6568     History   Chief Complaint Chief Complaint  Patient presents with  . Dysuria    HPI Becky [name] is a 58 y.o. female.  HPI   Becky [name] is a 58 y.o. female who presents to the Emergency Department complaining of vaginal itching burning and dysuria for 3 days.  She states that she has been taking over-the-counter Azo without relief.  She reports history of frequent urinary tract infections and feels her current symptoms are similar to previous.  She states she has scratched her vagina until it bleeds.  she is concerned that if she takes antibiotics she will develop a yeast infection.  She denies fever, chills, vomiting, abdominal pain, and hematuria.  No vaginal discharge genital lesions or rash, or recent unprotected intercourse. She admits to frequent UTI's.    Past Medical History:  Diagnosis Date  . Bipolar affective ([place])   . Bronchitis   . Cocaine abuse ([place])   . Depression   . Headache   . Hemorrhoids   . Hypertension   . MDD (major depressive disorder)     Patient Active Problem List   Diagnosis Date Noted  . Grade IV hemorrhoids 10/30/2018  . Hemorrhoidal skin tags 10/30/2018  . Diastasis recti 10/30/2018  . Epigastric hernia 10/03/2018  . Hemorrhoids 10/03/2018  . Personal history of colonic polyps 10/03/2018  . Tobacco abuse   . Tobacco abuse counseling   . Cellulitis of left knee 06/11/2018  . Essential hypertension 06/11/2018  . Severe recurrent major depression without psychotic features ([place]) 04/09/2017  . Chronic hepatitis C without hepatic coma ([place])   . Alcohol use disorder, severe, dependence ([place]) 01/20/2016  . Cocaine use disorder, severe, dependence ([place]) 01/20/2016  . Mild benzodiazepine use disorder ([place]) 01/20/2016  . Chronic pain syndrome 01/20/2016  . Hepatitis C 01/20/2016  . Prolapsed internal hemorrhoids 06/08/2015  .  Constipation 06/08/2015  . Major depressive disorder, recurrent, severe without psychotic features [facility])     Past Surgical History:  Procedure Laterality Date  . COLONOSCOPY WITH PROPOFOL N/A 06/23/2015   SLF: 1. Rectal bleeding/pain due to hemorrhoids2. one large and one small colorectal polyp removed.   . cysts removed from armpits bilaterally    . POLYPECTOMY N/A 06/23/2015   Procedure: POLYPECTOMY;  Surgeon: Danie Binder, MD;  Location: [facility];  Service: Endoscopy;  Laterality: N/A;  descending colon     OB History    Gravida  5   Para  3   Term  2   Preterm  1   AB  2   Living        SAB  2   TAB      Ectopic      Multiple      Live Births               Home Medications    Prior to Admission medications   Medication Sig Start Date End Date Taking? Authorizing Provider  acetaminophen (TYLENOL) 325 MG tablet Take 650 mg by mouth every 6 (six) hours as needed for moderate pain.     [provider]  albuterol (PROVENTIL HFA;VENTOLIN HFA) 108 (90 Base) MCG/ACT inhaler Inhale 2 puffs into the lungs every 6 (six) hours as needed for wheezing or shortness of breath.     [provider]  amLODipine (NORVASC) 5 MG tablet Take 1 tablet (  5 mg total) by mouth daily. 06/10/18   Cejay Cambre, PA-C  cephALEXin (KEFLEX) 500 MG capsule Take 1 capsule (500 mg total) by mouth 4 (four) times daily. 01/02/19   Kahle Mcqueen, PA-C  fluconazole (DIFLUCAN) 200 MG tablet Take 1 tablet p.o. as a single dose.  May take second tablet in 7 days if needed 01/02/19   Braedyn Kauk, PA-C  pantoprazole (PROTONIX) 40 MG tablet Take 40 mg by mouth 2 (two) times daily as needed (heartburn or indigestion).     [provider]  pravastatin (PRAVACHOL) 20 MG tablet Take 20 mg by mouth daily.    [provider]  traZODone (DESYREL) 50 MG tablet Take 50 mg by mouth at bedtime.    [provider]    Family History Family History  Problem Relation  Age of Onset  . Heart failure Mother   . Coronary artery disease Mother   . Heart failure Father   . Coronary artery disease Father   . Colon cancer Neg Hx   . Mental illness Neg Hx     Social History Social History   Tobacco Use  . Smoking status: Current Some Day Smoker    Packs/day: 0.50    Types: Cigarettes  . Smokeless tobacco: Never Used  . Tobacco comment: vague reports of smoking intermittently   Substance Use Topics  . Alcohol use: Yes    Comment: 40ozs per day  . Drug use: Yes    Types: Benzodiazepines, Marijuana, Cocaine     Allergies   Patient has no known allergies.   Review of Systems Review of Systems  Constitutional: Negative for activity change, appetite change, chills and fever.  Respiratory: Negative for chest tightness and shortness of breath.   Gastrointestinal: Negative for abdominal pain, nausea and vomiting.  Genitourinary: Positive for dysuria, frequency and urgency. Negative for decreased urine volume, difficulty urinating, flank pain, hematuria and vaginal discharge.  Musculoskeletal: Negative for back pain.  Skin: Negative for rash.  Neurological: Negative for dizziness, weakness and numbness.  Hematological: Negative for adenopathy.  Psychiatric/Behavioral: Negative for confusion.     Physical Exam Updated Vital Signs BP (!) 153/93 (BP Location: Right Arm)   Pulse 63   Temp 98.4 F (36.9 C) (Oral)   Resp 20   Ht 5\' 9"  (1.753 m)   Wt 72.6 kg   SpO2 100%   BMI 23.63 kg/m   Physical Exam Vitals signs and nursing note reviewed.  Constitutional:      General: She is not in acute distress.    Appearance: Normal appearance. She is well-developed. She is not ill-appearing.  HENT:     Head: Normocephalic and atraumatic.     Mouth/Throat:     Mouth: Mucous membranes are moist.  Cardiovascular:     Rate and Rhythm: Normal rate and regular rhythm.     Heart sounds: Normal heart sounds. No murmur.  Pulmonary:     Effort: Pulmonary  effort is normal. No respiratory distress.     Breath sounds: Normal breath sounds. No wheezing or rales.  Abdominal:     General: There is no distension.     Palpations: Abdomen is soft. Abdomen is not rigid. There is no mass.     Tenderness: There is no abdominal tenderness. There is no guarding or rebound. Negative signs include McBurney's sign.     Comments: No CVA tenderness  Musculoskeletal: Normal range of motion.     Right lower leg: No edema.  Left lower leg: No edema.  Skin:    General: Skin is warm and dry.     Findings: No rash.  Neurological:     General: No focal deficit present.     Mental Status: She is alert.     Sensory: No sensory deficit.     Motor: No weakness.      ED Treatments / Results  Labs (all labs ordered are listed, but only abnormal results are displayed) Labs Reviewed  URINALYSIS, ROUTINE W REFLEX MICROSCOPIC - Abnormal; Notable for the following components:      Result Value   APPearance HAZY (*)    Hgb urine dipstick SMALL (*)    Nitrite POSITIVE (*)    Leukocytes,Ua LARGE (*)    Bacteria, UA MANY (*)    All other components within normal limits  URINE CULTURE    EKG None  Radiology No results found.  Procedures Procedures (including critical care time)  Medications Ordered in ED Medications - No data to display   Initial Impression / Assessment and Plan / ED Course  I have reviewed the triage vital signs and the nursing notes.  Pertinent labs & imaging results that were available during my care of the patient were reviewed by me and considered in my medical decision making (see chart for details).     Patient well-appearing.  Vital signs reassuring.  Urine culture is pending. No concerning sx's for kidney stone or pyelonephritis.  Pt agrees to abx and rx written for diflucan as well.  Return precautions discussed.   Final Clinical Impressions(s) / ED Diagnoses   Final diagnoses:  Acute cystitis with hematuria    ED  Discharge Orders         Ordered    cephALEXin (KEFLEX) 500 MG capsule  4 times daily     01/02/19 0945    fluconazole (DIFLUCAN) 200 MG tablet     01/02/19 0945           Kem Parkinson, PA-C 01/02/19 2101    [name], MD 01/08/19 [id number]

## 2019-01-02 NOTE — ED Triage Notes (Signed)
Pt c/o dysuria, vaginal itching, and burning x 3 days.  Pt says thinks has a UTI and yeast infection.  Has been taking azo.

## 2019-01-08 LAB — URINE CULTURE: Culture: >=100000 — AB

## 2019-01-09 ENCOUNTER — Telehealth: Payer: Self-pay | Admitting: *Deleted

## 2019-01-09 NOTE — Telephone Encounter (Signed)
Post ED Visit - Positive Culture Follow-up  Culture report reviewed by antimicrobial stewardship pharmacist:  []  Elenor Quinones, Pharm.D. []  Heide Guile, Pharm.D., BCPS AQ-ID []  Parks Neptune, Pharm.D., BCPS []  Alycia Rossetti, Pharm.D., BCPS []  [place], Pharm.D., BCPS, AAHIVP []  Legrand Como, Pharm.D., BCPS, AAHIVP []  Salome Arnt, PharmD, BCPS []  Johnnette Gourd, PharmD, BCPS [x]  Hughes Better, PharmD, BCPS []  Leeroy Cha, PharmD  Positive urine culture Treated with Cephalexin, organism sensitive to the same and no further patient follow-up is required at this time.  Harlon Flor [facility] 01/09/2019, 9:12 AM

## 2019-02-05 ENCOUNTER — Ambulatory Visit: Payer: Medicaid Other | Admitting: Nurse Practitioner

## 2019-02-07 ENCOUNTER — Ambulatory Visit: Payer: Medicaid Other | Admitting: Nurse Practitioner

## 2019-04-29 ENCOUNTER — Ambulatory Visit: Payer: Medicaid Other | Admitting: Nurse Practitioner

## 2019-04-29 ENCOUNTER — Telehealth: Payer: Self-pay | Admitting: Gastroenterology

## 2019-04-29 ENCOUNTER — Encounter: Payer: Self-pay | Admitting: Gastroenterology

## 2019-04-29 NOTE — Progress Notes (Deleted)
For follow-up on EtOH abuse chronic hepatitis C, history of colon polyps, hemorrhoids.  The patient was last seen in our office 10/03/2018 for the same.  Previous colonoscopy 06/23/2015 which found hemorrhoids and tubular adenoma polyp x2, recommended repeat in 3 years and surgical referral for hemorrhoidectomy.  At her last visit noted persistent hemorrhoid pain and intermittent bleeding typically when constipated.  Does not remember being told to see a surgeon for hemorrhoidectomy consideration.  Intermittent abdominal pain related to ventral hernia but she admits she "did not show up for hernia repair surgery."  Hemorrhoid topical treatment "does not work" and notes the need to be "fixed or something."  Hepatitis C diagnosis 14 years ago.  She ran out of her hypertension medicines 2 weeks ago and agrees to pick them up today.  Last cocaine use a couple days ago and planning on starting NA meetings.  States she sometimes uses cocaine because it "helps her hemorrhoids."  Amended labs, colonoscopy, referral to surgeon, follow-up in 4 months.  Unfortunately, the patient was a no-show to her preop appointment for colonoscopy.  She did see a surgeon related to hemorrhoids for 2 visits.  She agreed to follow-up in April to schedule her surgery.  No April appointment noted, although this could have been deferred due to COVID-19/coronavirus pandemic.  Today she states

## 2019-04-29 NOTE — Telephone Encounter (Signed)
PATIENT WAS A NO SHOW AND LETTER SENT  °

## 2020-08-12 ENCOUNTER — Encounter: Payer: Self-pay | Admitting: Internal Medicine

## 2020-08-12 ENCOUNTER — Other Ambulatory Visit: Payer: Self-pay | Admitting: Gerontology

## 2020-08-12 ENCOUNTER — Other Ambulatory Visit: Payer: Self-pay | Admitting: Internal Medicine

## 2020-08-12 ENCOUNTER — Other Ambulatory Visit (HOSPITAL_COMMUNITY): Payer: Self-pay | Admitting: Internal Medicine

## 2020-08-12 DIAGNOSIS — F17210 Nicotine dependence, cigarettes, uncomplicated: Secondary | ICD-10-CM

## 2020-08-12 DIAGNOSIS — Z87891 Personal history of nicotine dependence: Secondary | ICD-10-CM

## 2020-08-15 ENCOUNTER — Other Ambulatory Visit: Payer: Self-pay

## 2020-08-15 ENCOUNTER — Encounter (HOSPITAL_COMMUNITY): Payer: Self-pay | Admitting: Emergency Medicine

## 2020-08-15 ENCOUNTER — Emergency Department (HOSPITAL_COMMUNITY)
Admission: EM | Admit: 2020-08-15 | Discharge: 2020-08-15 | Disposition: A | Discharge status: Home / Self Care | Payer: Medicaid Other | Attending: Emergency Medicine | Admitting: Emergency Medicine

## 2020-08-15 DIAGNOSIS — R519 Headache, unspecified: Secondary | ICD-10-CM | POA: Insufficient documentation

## 2020-08-15 DIAGNOSIS — R030 Elevated blood-pressure reading, without diagnosis of hypertension: Secondary | ICD-10-CM | POA: Insufficient documentation

## 2020-08-15 DIAGNOSIS — Z5321 Procedure and treatment not carried out due to patient leaving prior to being seen by health care provider: Secondary | ICD-10-CM | POA: Insufficient documentation

## 2020-08-15 HISTORY — DX: Chronic obstructive pulmonary disease, unspecified: J44.9

## 2020-08-15 NOTE — ED Triage Notes (Signed)
Pt reports high blood pressure, went to her doctor the other day and was told to go to the ER. Pt reports headache. Doesn't have the money to get her BP rx filled.

## 2020-08-15 NOTE — ED Triage Notes (Signed)
After completing triage pt asked what her BP was and I told her the reading. Pt states "i'm going home that isn't that serious" I advised pt to be see by a provider but she did not want to do that. Pt left ER on her own. NAD noted.

## 2020-09-09 ENCOUNTER — Ambulatory Visit (HOSPITAL_COMMUNITY): Payer: Medicaid Other

## 2020-09-09 ENCOUNTER — Encounter (HOSPITAL_COMMUNITY): Payer: Self-pay

## 2020-10-06 ENCOUNTER — Encounter: Payer: Self-pay | Admitting: Nurse Practitioner

## 2020-10-06 ENCOUNTER — Ambulatory Visit: Payer: Medicaid Other | Admitting: Gastroenterology

## 2020-10-06 NOTE — Progress Notes (Deleted)
History of adenomas in 2016 with surveillance overdue.

## 2021-08-12 ENCOUNTER — Emergency Department (HOSPITAL_COMMUNITY): Payer: Medicaid Other

## 2021-08-12 ENCOUNTER — Other Ambulatory Visit: Payer: Self-pay

## 2021-08-12 ENCOUNTER — Encounter (HOSPITAL_COMMUNITY): Payer: Self-pay | Admitting: *Deleted

## 2021-08-12 ENCOUNTER — Emergency Department (HOSPITAL_COMMUNITY)
Admission: EM | Admit: 2021-08-12 | Discharge: 2021-08-12 | Disposition: A | Discharge status: Home / Self Care | Payer: Medicaid Other | Attending: Emergency Medicine | Admitting: Emergency Medicine

## 2021-08-12 DIAGNOSIS — F17210 Nicotine dependence, cigarettes, uncomplicated: Secondary | ICD-10-CM | POA: Insufficient documentation

## 2021-08-12 DIAGNOSIS — I10 Essential (primary) hypertension: Secondary | ICD-10-CM | POA: Diagnosis not present

## 2021-08-12 DIAGNOSIS — Z79899 Other long term (current) drug therapy: Secondary | ICD-10-CM | POA: Insufficient documentation

## 2021-08-12 DIAGNOSIS — J449 Chronic obstructive pulmonary disease, unspecified: Secondary | ICD-10-CM | POA: Insufficient documentation

## 2021-08-12 DIAGNOSIS — R309 Painful micturition, unspecified: Secondary | ICD-10-CM | POA: Insufficient documentation

## 2021-08-12 DIAGNOSIS — R1030 Lower abdominal pain, unspecified: Secondary | ICD-10-CM | POA: Insufficient documentation

## 2021-08-12 DIAGNOSIS — R110 Nausea: Secondary | ICD-10-CM | POA: Insufficient documentation

## 2021-08-12 DIAGNOSIS — Z716 Tobacco abuse counseling: Secondary | ICD-10-CM | POA: Insufficient documentation

## 2021-08-12 DIAGNOSIS — M65341 Trigger finger, right ring finger: Secondary | ICD-10-CM | POA: Insufficient documentation

## 2021-08-12 DIAGNOSIS — R300 Dysuria: Secondary | ICD-10-CM | POA: Insufficient documentation

## 2021-08-12 DIAGNOSIS — R059 Cough, unspecified: Secondary | ICD-10-CM | POA: Diagnosis present

## 2021-08-12 DIAGNOSIS — R030 Elevated blood-pressure reading, without diagnosis of hypertension: Secondary | ICD-10-CM

## 2021-08-12 LAB — CBC
HCT: 44.5 % (ref 36.0–46.0)
Hemoglobin: 14.8 g/dL (ref 12.0–15.0)
MCH: 30.3 pg (ref 26.0–34.0)
MCHC: 33.3 g/dL (ref 30.0–36.0)
MCV: 91.2 fL (ref 80.0–100.0)
Platelets: 184 10*3/uL (ref 150–400)
RBC: 4.88 MIL/uL (ref 3.87–5.11)
RDW: 13.8 % (ref 11.5–15.5)
WBC: 5.5 10*3/uL (ref 4.0–10.5)
nRBC: 0 % (ref 0.0–0.2)

## 2021-08-12 LAB — COMPREHENSIVE METABOLIC PANEL
ALT: 22 U/L (ref 0–44)
AST: 24 U/L (ref 15–41)
Albumin: 4 g/dL (ref 3.5–5.0)
Alkaline Phosphatase: 54 U/L (ref 38–126)
Anion gap: 7 (ref 5–15)
BUN: 20 mg/dL (ref 6–20)
CO2: 18 mmol/L — ABNORMAL LOW (ref 22–32)
Calcium: 9.2 mg/dL (ref 8.9–10.3)
Chloride: 114 mmol/L — ABNORMAL HIGH (ref 98–111)
Creatinine, Ser: 0.88 mg/dL (ref 0.44–1.00)
GFR, Estimated: >60 mL/min (ref >60)
Glucose, Bld: 93 mg/dL (ref 70–99)
Potassium: 4 mmol/L (ref 3.5–5.1)
Sodium: 139 mmol/L (ref 135–145)
Total Bilirubin: 0.5 mg/dL (ref 0.3–1.2)
Total Protein: 7.1 g/dL (ref 6.5–8.1)

## 2021-08-12 LAB — LIPASE, BLOOD: Lipase: 25 U/L (ref 11–51)

## 2021-08-12 MED ORDER — PREDNISONE 20 MG PO TABS: 40.0000 mg | ORAL_TABLET | Freq: Every day | ORAL | 0 refills | Status: AC

## 2021-08-12 MED ORDER — AMLODIPINE BESYLATE 5 MG PO TABS: 5.0000 mg | ORAL_TABLET | Freq: Once | ORAL | Status: DC

## 2021-08-12 MED ORDER — IBUPROFEN 800 MG PO TABS
800.0000 mg | ORAL_TABLET | Freq: Once | ORAL | Status: AC
  Administered 2021-08-12: 800 mg via ORAL
  Filled 2021-08-12: qty 2

## 2021-08-12 MED ORDER — DOXYCYCLINE HYCLATE 100 MG PO CAPS: 100.0000 mg | ORAL_CAPSULE | Freq: Two times a day (BID) | ORAL | 0 refills | Status: AC

## 2021-08-12 MED ORDER — DEXAMETHASONE SODIUM PHOSPHATE 10 MG/ML IJ SOLN
10.0000 mg | Freq: Once | INTRAMUSCULAR | Status: AC
  Administered 2021-08-12: 10 mg
  Filled 2021-08-12: qty 1

## 2021-08-12 MED ORDER — IPRATROPIUM-ALBUTEROL 0.5-2.5 (3) MG/3ML IN SOLN
3.0000 mL | Freq: Once | RESPIRATORY_TRACT | Status: AC
  Administered 2021-08-12: 3 mL via RESPIRATORY_TRACT
  Filled 2021-08-12: qty 3

## 2021-08-12 NOTE — ED Provider Notes (Signed)
[facility] EMERGENCY DEPARTMENT Provider Note   CSN: [id number] Arrival date & time: 08/12/21  1200     History Chief Complaint  Patient presents with   Abdominal Pain    Becky [name] is a 60 y.o. female.  60 year old female with history of cocaine abuse, bipolar disorder, hypertension, COPD, daily tobacco use presents to the ER secondary to a myriad of complaints.  Patient reports intermittent abdominal discomfort over the past few months.  He thinks he might have a urinary tract infection.  He has some mild burning with urination.  No abnormal vaginal bleeding or discharge.  Some mild nausea without emesis, tolerant oral intake without difficulty.  No fevers or chills.  No chest pain or dyspnea.  Patient does report cough over the past month, history of COPD, active tobacco smoker.  Cough productive with green/clear phlegm. No perceived dyspnea.   The history is provided by the patient. No language interpreter was used.  Abdominal Pain Pain location:  Suprapubic Pain quality: aching   Pain radiates to:  Does not radiate Pain severity:  Mild Onset quality:  Gradual Duration:  1 week Timing:  Intermittent Chronicity:  New Ineffective treatments:  None tried Associated symptoms: cough and dysuria   Associated symptoms: no chest pain, no chills, no fever, no hematochezia, no hematuria, no melena, no nausea, no shortness of breath, no sore throat, no vaginal bleeding and no vomiting       Past Medical History:  Diagnosis Date   Bipolar affective ([place])    Bronchitis    Cocaine abuse ([place])    COPD (chronic obstructive pulmonary disease) (HCC)    Depression    Headache    Hemorrhoids    Hypertension    MDD (major depressive disorder)     Patient Active Problem List   Diagnosis Date Noted   Grade IV hemorrhoids 10/30/2018   Hemorrhoidal skin tags 10/30/2018   Diastasis recti 10/30/2018   Epigastric hernia 10/03/2018   Hemorrhoids 10/03/2018   Personal history of  colonic polyps 10/03/2018   Tobacco abuse    Tobacco abuse counseling    Cellulitis of left knee 06/11/2018   Essential hypertension 06/11/2018   Severe recurrent major depression without psychotic features ([place]) 04/09/2017   Chronic hepatitis C without hepatic coma (HCC)    Alcohol use disorder, severe, dependence ([place]) 01/20/2016   Cocaine use disorder, severe, dependence ([place]) 01/20/2016   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 Rectal bleeding/pain due to hemorrhoids2. one large and one small colorectal polyp removed.    cysts removed from armpits bilaterally     POLYPECTOMY N/A 06/23/2015   Procedure: POLYPECTOMY;  Surgeon: Danie Binder, MD;  Location: [facility];  Service: Endoscopy;  Laterality: N/A;  descending colon     OB History     Gravida  5   Para  3   Term  2   Preterm  1   AB  2   Living         SAB  2   IAB      Ectopic      Multiple      Live Births  Family History  Problem Relation Age of Onset   Heart failure Mother    Coronary artery disease Mother    Heart failure Father    Coronary artery disease Father    Colon cancer Neg Hx    Mental illness Neg Hx     Social History   Tobacco Use   Smoking status: Some Days    Packs/day: 0.50    Types: Cigarettes   Smokeless tobacco: Never   Tobacco comments:    vague reports of smoking intermittently   Vaping Use   Vaping Use: Never used  Substance Use Topics   Alcohol use: Yes    Comment: 40ozs per day   Drug use: Yes    Types: Benzodiazepines, Marijuana, Cocaine    Home Medications Prior to Admission medications   Medication Sig Start Date  End Date Taking? Authorizing Provider  doxycycline (VIBRAMYCIN) 100 MG capsule Take 1 capsule (100 mg total) by mouth 2 (two) times daily for 7 days. 08/12/21 08/19/21 Yes Sloan Leiter, DO  predniSONE (DELTASONE) 20 MG tablet Take 2 tablets (40 mg total) by mouth daily for 5 days. 08/12/21 08/17/21 Yes Tanda Rockers A, DO  albuterol (PROVENTIL HFA;VENTOLIN HFA) 108 (90 Base) MCG/ACT inhaler Inhale 2 puffs into the lungs every 6 (six) hours as needed for wheezing or shortness of breath.  Patient not taking: Reported on 08/12/2021    [provider]  amLODipine (NORVASC) 5 MG tablet Take 1 tablet (5 mg total) by mouth daily. Patient not taking: Reported on 08/12/2021 06/10/18   Pauline Aus, PA-C  cephALEXin (KEFLEX) 500 MG capsule Take 1 capsule (500 mg total) by mouth 4 (four) times daily. Patient not taking: No sig reported 01/02/19   Triplett, Tammy, PA-C  fluconazole (DIFLUCAN) 200 MG tablet Take 1 tablet p.o. as a single dose.  May take second tablet in 7 days if needed Patient not taking: No sig reported 01/02/19   Triplett, Tammy, PA-C  lisinopril-hydrochlorothiazide (ZESTORETIC) 20-25 MG tablet Take 1 tablet by mouth daily. Patient not taking: No sig reported 04/22/21   [provider]  pantoprazole (PROTONIX) 40 MG tablet Take 40 mg by mouth 2 (two) times daily as needed (heartburn or indigestion).  Patient not taking: Reported on 08/12/2021    [provider]  pravastatin (PRAVACHOL) 20 MG tablet Take 20 mg by mouth daily. Patient not taking: Reported on 08/12/2021    [provider]  traZODone (DESYREL) 50 MG tablet Take 50 mg by mouth at bedtime. Patient not taking: Reported on 08/12/2021    [provider]    Allergies    Patient has no known allergies.  Review of Systems   Review of Systems  Constitutional:  Negative for chills and fever.  HENT:  Negative for facial swelling, sore throat and trouble swallowing.   Eyes:  Negative for  photophobia and visual disturbance.  Respiratory:  Positive for cough. Negative for shortness of breath.   Cardiovascular:  Negative for chest pain and palpitations.  Gastrointestinal:  Positive for abdominal pain. Negative for hematochezia, melena, nausea and vomiting.  Endocrine: Negative for polydipsia and polyuria.  Genitourinary:  Positive for dysuria. Negative for difficulty urinating, hematuria and vaginal bleeding.  Musculoskeletal:  Negative for gait problem and joint swelling.  Skin:  Negative for pallor and rash.  Neurological:  Negative for syncope and headaches.  Psychiatric/Behavioral:  Negative for agitation and confusion.    Physical Exam Updated Vital Signs BP (!) 165/114   Pulse 60  Temp 98.1 F (36.7 C)   Resp 19   Ht $R'5\' 9"'Tv$  (1.753 m)   Wt 66.9 kg   SpO2 98%   BMI 21.78 kg/m   Physical Exam Vitals and nursing note reviewed.  Constitutional:      General: She is not in acute distress.    Appearance: Normal appearance.  HENT:     Head: Normocephalic and atraumatic.     Right Ear: External ear normal.     Left Ear: External ear normal.     Nose: Nose normal.     Mouth/Throat:     Mouth: Mucous membranes are moist.  Eyes:     General: No scleral icterus.       Right eye: No discharge.        Left eye: No discharge.  Cardiovascular:     Rate and Rhythm: Normal rate and regular rhythm.     Pulses: Normal pulses.     Heart sounds: Normal heart sounds.  Pulmonary:     Effort: Pulmonary effort is normal. No respiratory distress.     Breath sounds: Wheezing present.  Abdominal:     General: Abdomen is flat.     Tenderness: There is no abdominal tenderness.  Musculoskeletal:        General: Normal range of motion.     Cervical back: Normal range of motion.     Right lower leg: No edema.     Left lower leg: No edema.     Comments: Trigger finger to ring finger on right  Skin:    General: Skin is warm and dry.     Capillary Refill: Capillary refill  takes less than 2 seconds.  Neurological:     Mental Status: She is alert.  Psychiatric:        Mood and Affect: Mood normal.        Behavior: Behavior normal.    ED Results / Procedures / Treatments   Labs (all labs ordered are listed, but only abnormal results are displayed) Labs Reviewed  COMPREHENSIVE METABOLIC PANEL - Abnormal; Notable for the following components:      Result Value   Chloride 114 (*)    CO2 18 (*)    All other components within normal limits  LIPASE, BLOOD  CBC  URINALYSIS, ROUTINE W REFLEX MICROSCOPIC    EKG EKG Interpretation  Date/Time:  Thursday August 12 2021 13:11:15 EDT Ventricular Rate:  70 PR Interval:  120 QRS Duration: 93 QT Interval:  468 QTC Calculation: 505 R Axis:   75 Text Interpretation: Sinus rhythm Nonspecific T wave abnormality similar to prior tracing Confirmed by Wynona Dove ([id number]) on 08/12/2021 6:29:52 PM  Radiology DG Chest Portable 1 View  Result Date: 08/12/2021 CLINICAL DATA:  COPD.  60 year old female. EXAM: PORTABLE CHEST 1 VIEW COMPARISON:  August 25, 2018. FINDINGS: EKG leads project over the chest. Trachea is midline. Cardiomediastinal contours and hilar structures are normal. Minimal atelectasis or scarring in the RIGHT mid chest. Lungs are otherwise clear. No sign of pneumothorax. Evidence of prior trauma to the LEFT hemithorax with healed rib fractures of LEFT posterolateral fourth and fifth ribs On limited assessment otherwise no acute skeletal findings. IMPRESSION: Minimal atelectasis or scarring in the RIGHT mid chest. No acute cardiopulmonary disease. Electronically Signed   By: Zetta Bills M.D.   On: 08/12/2021 14:28    Procedures Procedures   Medications Ordered in ED Medications  amLODipine (NORVASC) tablet 5 mg (has no administration in time range)  ibuprofen (ADVIL) tablet 800 mg (800 mg Oral Given 08/12/21 1358)  dexamethasone (DECADRON) injection 10 mg (10 mg Other Given 08/12/21 1446)   ipratropium-albuterol (DUONEB) 0.5-2.5 (3) MG/3ML nebulizer solution 3 mL (3 mLs Nebulization Given 08/12/21 1510)    ED Course  I have reviewed the triage vital signs and the nursing notes.  Pertinent labs & imaging results that were available during my care of the patient were reviewed by me and considered in my medical decision making (see chart for details).    MDM Rules/Calculators/A&P                          This patient complains of multiple complaints; this involves an extensive number of treatment Options and is a complaint that carries with it a high risk of complications and Morbidity. Vital signs reviewed and are stable. Serious etiologies considered.   I ordered, reviewed and interpreted labs which are stable  I ordered medication motrin  Previous records obtained and reviewed   CXR is non-acute.  Patient likely with mild COPD exacerbation. Start on oral steroids and doxy. No respiratory distress.  Patient likely with trigger finger on the right. Advised buddy taping  or splint which she did not want. Oral NSAIDS. O/p orthopedics follow up.  Given motrin in ED.   Advised patient to stop smoking, discussed benefits of quitting and encourage her to quit smoking.   Pt refused to provide a urine sample, she does not want to wait to provide urine and is ready to leave. Advised her to f/u with PCP regarding providing a urine sample.  BP elevated, did not take norvasc this morning because she did not feel like taking it, she was given this in ED prior to discharge. No cp or palpitations, no light headedness.   The patient improved significantly and was discharged in stable condition. Detailed discussions were had with the patient regarding current findings, and need for close f/u with PCP or on call doctor. The patient has been instructed to return immediately if the symptoms worsen in any way for re-evaluation. Patient verbalized understanding and is in agreement with  current care plan. All questions answered prior to discharge.   Final Clinical Impression(s) / ED Diagnoses Final diagnoses:  Elevated blood pressure reading  Chronic obstructive pulmonary disease, unspecified COPD type ([place])  Trigger ring finger of right hand  Encounter for smoking cessation counseling    Rx / DC Orders ED Discharge Orders          Ordered    predniSONE (DELTASONE) 20 MG tablet  Daily        08/12/21 1507    doxycycline (VIBRAMYCIN) 100 MG capsule  2 times daily        08/12/21 1507             Jeanell Sparrow, DO 08/12/21 1841

## 2021-08-12 NOTE — ED Notes (Signed)
Reported that pt refused last set of vitals before discharge.

## 2021-08-12 NOTE — ED Notes (Signed)
Pt ambulated out of dept. With steady gait and without waiting for d/c papers.

## 2021-08-12 NOTE — ED Triage Notes (Signed)
Patient states she has abdominal pain, blood pressure problems, needs a chest xray and has hepatitis

## 2021-10-20 ENCOUNTER — Emergency Department (HOSPITAL_COMMUNITY): Payer: Medicaid Other

## 2021-10-20 ENCOUNTER — Encounter (HOSPITAL_COMMUNITY): Payer: Self-pay

## 2021-10-20 ENCOUNTER — Other Ambulatory Visit: Payer: Self-pay

## 2021-10-20 ENCOUNTER — Emergency Department (HOSPITAL_COMMUNITY)
Admission: EM | Admit: 2021-10-20 | Discharge: 2021-10-20 | Disposition: A | Discharge status: Home / Self Care | Payer: Medicaid Other | Attending: Emergency Medicine | Admitting: Emergency Medicine

## 2021-10-20 DIAGNOSIS — Z79899 Other long term (current) drug therapy: Secondary | ICD-10-CM | POA: Insufficient documentation

## 2021-10-20 DIAGNOSIS — R519 Headache, unspecified: Secondary | ICD-10-CM | POA: Insufficient documentation

## 2021-10-20 DIAGNOSIS — R531 Weakness: Secondary | ICD-10-CM | POA: Diagnosis not present

## 2021-10-20 DIAGNOSIS — I10 Essential (primary) hypertension: Secondary | ICD-10-CM | POA: Diagnosis not present

## 2021-10-20 DIAGNOSIS — R110 Nausea: Secondary | ICD-10-CM | POA: Insufficient documentation

## 2021-10-20 DIAGNOSIS — H53149 Visual discomfort, unspecified: Secondary | ICD-10-CM | POA: Insufficient documentation

## 2021-10-20 DIAGNOSIS — J449 Chronic obstructive pulmonary disease, unspecified: Secondary | ICD-10-CM | POA: Diagnosis not present

## 2021-10-20 DIAGNOSIS — F17210 Nicotine dependence, cigarettes, uncomplicated: Secondary | ICD-10-CM | POA: Insufficient documentation

## 2021-10-20 DIAGNOSIS — I160 Hypertensive urgency: Secondary | ICD-10-CM

## 2021-10-20 DIAGNOSIS — R42 Dizziness and giddiness: Secondary | ICD-10-CM | POA: Diagnosis not present

## 2021-10-20 LAB — CBC WITH DIFFERENTIAL/PLATELET
Abs Immature Granulocytes: 0.02 10*3/uL (ref 0.00–0.07)
Basophils Absolute: 0 10*3/uL (ref 0.0–0.1)
Basophils Relative: 1 %
Eosinophils Absolute: 0.1 10*3/uL (ref 0.0–0.5)
Eosinophils Relative: 2 %
HCT: 47.8 % — ABNORMAL HIGH (ref 36.0–46.0)
Hemoglobin: 15.7 g/dL — ABNORMAL HIGH (ref 12.0–15.0)
Immature Granulocytes: 0 %
Lymphocytes Relative: 37 %
Lymphs Abs: 2.2 10*3/uL (ref 0.7–4.0)
MCH: 29.8 pg (ref 26.0–34.0)
MCHC: 32.8 g/dL (ref 30.0–36.0)
MCV: 90.7 fL (ref 80.0–100.0)
Monocytes Absolute: 0.5 10*3/uL (ref 0.1–1.0)
Monocytes Relative: 9 %
Neutro Abs: 3 10*3/uL (ref 1.7–7.7)
Neutrophils Relative %: 51 %
Platelets: 214 10*3/uL (ref 150–400)
RBC: 5.27 MIL/uL — ABNORMAL HIGH (ref 3.87–5.11)
RDW: 13.6 % (ref 11.5–15.5)
WBC: 5.8 10*3/uL (ref 4.0–10.5)
nRBC: 0 % (ref 0.0–0.2)

## 2021-10-20 LAB — BASIC METABOLIC PANEL
Anion gap: 8 (ref 5–15)
BUN: 24 mg/dL — ABNORMAL HIGH (ref 6–20)
CO2: 21 mmol/L — ABNORMAL LOW (ref 22–32)
Calcium: 9.1 mg/dL (ref 8.9–10.3)
Chloride: 107 mmol/L (ref 98–111)
Creatinine, Ser: 0.93 mg/dL (ref 0.44–1.00)
GFR, Estimated: >60 mL/min (ref >60)
Glucose, Bld: 97 mg/dL (ref 70–99)
Potassium: 4.1 mmol/L (ref 3.5–5.1)
Sodium: 136 mmol/L (ref 135–145)

## 2021-10-20 LAB — SEDIMENTATION RATE: Sed Rate: 1 mm/hr (ref 0–22)

## 2021-10-20 MED ORDER — AMLODIPINE BESYLATE 5 MG PO TABS
5.0000 mg | ORAL_TABLET | Freq: Every day | ORAL | 0 refills | Status: DC
Start: 2021-10-20 — End: 2024-04-27

## 2021-10-20 MED ORDER — METOCLOPRAMIDE HCL 5 MG/ML IJ SOLN
10.0000 mg | Freq: Once | INTRAMUSCULAR | Status: AC
  Administered 2021-10-20: 10 mg via INTRAVENOUS
  Filled 2021-10-20: qty 2

## 2021-10-20 MED ORDER — DEXAMETHASONE SODIUM PHOSPHATE 10 MG/ML IJ SOLN
10.0000 mg | Freq: Once | INTRAMUSCULAR | Status: AC
  Administered 2021-10-20: 10 mg via INTRAVENOUS
  Filled 2021-10-20: qty 1

## 2021-10-20 MED ORDER — DIPHENHYDRAMINE HCL 50 MG/ML IJ SOLN
25.0000 mg | Freq: Once | INTRAMUSCULAR | Status: AC
  Administered 2021-10-20: 25 mg via INTRAVENOUS
  Filled 2021-10-20: qty 1

## 2021-10-20 MED ORDER — AMLODIPINE BESYLATE 5 MG PO TABS
5.0000 mg | ORAL_TABLET | Freq: Once | ORAL | Status: AC
  Administered 2021-10-20: 5 mg via ORAL
  Filled 2021-10-20: qty 1

## 2021-10-20 MED ORDER — LISINOPRIL-HYDROCHLOROTHIAZIDE 20-25 MG PO TABS: 1.0000 | ORAL_TABLET | Freq: Every day | ORAL | 0 refills | Status: DC

## 2021-10-20 MED ORDER — GADOBUTROL 1 MMOL/ML IV SOLN
7.0000 mL | Freq: Once | INTRAVENOUS | Status: AC | PRN
  Administered 2021-10-20: 7 mL via INTRAVENOUS

## 2021-10-20 MED ORDER — LISINOPRIL 10 MG PO TABS
20.0000 mg | ORAL_TABLET | Freq: Once | ORAL | Status: AC
  Administered 2021-10-20: 20 mg via ORAL
  Filled 2021-10-20: qty 2

## 2021-10-20 MED ORDER — HYDROCHLOROTHIAZIDE 25 MG PO TABS
25.0000 mg | ORAL_TABLET | Freq: Once | ORAL | Status: AC
  Administered 2021-10-20: 25 mg via ORAL
  Filled 2021-10-20: qty 1

## 2021-10-20 NOTE — ED Notes (Signed)
Patient transported to MRI 

## 2021-10-20 NOTE — Discharge Instructions (Signed)
As discussed, your evaluation today has been largely reassuring.  But, it is important that you monitor your condition carefully, and do not hesitate to return to the ED if you develop new, or concerning changes in your condition. ? ?Otherwise, please follow-up with your physician for appropriate ongoing care. ? ?

## 2021-10-20 NOTE — ED Provider Notes (Signed)
10:57 AM Patient sleeping, snoring, awakens easily.  I reviewed her MRI, no notable findings, given her apparent comfort, lack of distress, hemodynamic stability, patient discharged in stable condition.   Carmin Muskrat, MD 10/20/21 1058

## 2021-10-20 NOTE — ED Triage Notes (Signed)
Pt arrives via [facility] from home c/o headache that started this morning.

## 2021-10-20 NOTE — ED Provider Notes (Signed)
[facility] EMERGENCY DEPARTMENT Provider Note   CSN: [id number] Arrival date & time: 10/20/21  0557     History Chief Complaint  Patient presents with   Headache    Becky [name] is a 60 y.o. female.  Patient presents via EMS with a severe headache.  She is a poor historian.  Headache has been gradually building up for the past 3 days.  She is has associated nausea, photophobia and phonophobia.  Denies any history of migraines.  States she is not had a blood pressure medication for 2 or 3 months.  Has had a similar headache in the past but never this severe.  Taking Tylenol without relief.  There is no focal weakness, numbness or tingling.  No vomiting.  No chest pain or shortness of breath. Complains of pain to her eyes and her ears as well. She had a difficult time describing the headache but states it was not thunderclap and gradual progression but is now very severe.  States it feels better when she keeps her eyes closed.  Denies dizziness or lightheadedness. Chart review shows history of cocaine abuse which she denies recently  The history is provided by the patient.  Headache Associated symptoms: dizziness, photophobia and weakness   Associated symptoms: no congestion, no drainage, no fatigue, no fever, no myalgias, no nausea and no vomiting       Past Medical History:  Diagnosis Date   Bipolar affective ([place])    Bronchitis    Cocaine abuse ([place])    COPD (chronic obstructive pulmonary disease) ([place])    Depression    Headache    Hemorrhoids    Hypertension    MDD (major depressive disorder)     Patient Active Problem List   Diagnosis Date Noted   Grade IV hemorrhoids 10/30/2018   Hemorrhoidal skin tags 10/30/2018   Diastasis recti 10/30/2018   Epigastric hernia 10/03/2018   Hemorrhoids 10/03/2018   Personal history of colonic polyps 10/03/2018   Tobacco abuse    Tobacco abuse counseling    Cellulitis of left knee 06/11/2018   Essential hypertension  06/11/2018   Severe recurrent major depression without psychotic features ([place]) 04/09/2017   Chronic hepatitis C without hepatic coma (HCC)    Alcohol use disorder, severe, dependence ([place]) 01/20/2016   Cocaine use disorder, severe, dependence ([place]) 01/20/2016   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 Rectal bleeding/pain due to hemorrhoids2. one large and one small colorectal polyp removed.    cysts removed from armpits bilaterally     POLYPECTOMY N/A 06/23/2015   Procedure: POLYPECTOMY;  Surgeon: Danie Binder, MD;  Location: [facility];  Service: Endoscopy;  Laterality: N/A;  descending colon     OB History     Gravida  5   Para  3   Term  2   Preterm  1   AB  2   Living         SAB  2   IAB      Ectopic      Multiple      Live Births              Family History  Problem  Relation Age of Onset   Heart failure Mother    Coronary artery disease Mother    Heart failure Father    Coronary artery disease Father    Colon cancer Neg Hx    Mental illness Neg Hx     Social History   Tobacco Use   Smoking status: Some Days    Packs/day: 0.50    Types: Cigarettes   Smokeless tobacco: Never   Tobacco comments:    vague reports of smoking intermittently   Vaping Use   Vaping Use: Never used  Substance Use Topics   Alcohol use: Yes    Comment: 40ozs per day   Drug use: Yes    Types: Benzodiazepines, Marijuana, Cocaine    Home Medications Prior to Admission medications   Medication Sig Start Date End Date Taking? Authorizing Provider  albuterol (PROVENTIL HFA;VENTOLIN HFA) 108 (90 Base) MCG/ACT inhaler Inhale 2 puffs into the lungs  every 6 (six) hours as needed for wheezing or shortness of breath.  Patient not taking: Reported on 08/12/2021    [provider]  amLODipine (NORVASC) 5 MG tablet Take 1 tablet (5 mg total) by mouth daily. Patient not taking: Reported on 08/12/2021 06/10/18   Kem Parkinson, PA-C  cephALEXin (KEFLEX) 500 MG capsule Take 1 capsule (500 mg total) by mouth 4 (four) times daily. Patient not taking: No sig reported 01/02/19   Triplett, Tammy, PA-C  fluconazole (DIFLUCAN) 200 MG tablet Take 1 tablet p.o. as a single dose.  May take second tablet in 7 days if needed Patient not taking: No sig reported 01/02/19   Triplett, Tammy, PA-C  lisinopril-hydrochlorothiazide (ZESTORETIC) 20-25 MG tablet Take 1 tablet by mouth daily. Patient not taking: No sig reported 04/22/21   [provider]  pantoprazole (PROTONIX) 40 MG tablet Take 40 mg by mouth 2 (two) times daily as needed (heartburn or indigestion).  Patient not taking: Reported on 08/12/2021    [provider]  pravastatin (PRAVACHOL) 20 MG tablet Take 20 mg by mouth daily. Patient not taking: Reported on 08/12/2021    [provider]  traZODone (DESYREL) 50 MG tablet Take 50 mg by mouth at bedtime. Patient not taking: Reported on 08/12/2021    [provider]    Allergies    Patient has no known allergies.  Review of Systems   Review of Systems  Constitutional:  Negative for activity change, appetite change, fatigue and fever.  HENT:  Negative for congestion, postnasal drip and rhinorrhea.   Eyes:  Positive for photophobia.  Respiratory:  Negative for chest tightness and shortness of breath.   Cardiovascular:  Negative for chest pain.  Gastrointestinal:  Negative for nausea and vomiting.  Genitourinary:  Negative for dysuria and hematuria.  Musculoskeletal:  Negative for arthralgias, joint swelling and myalgias.  Neurological:  Positive for dizziness, weakness, light-headedness and headaches.   all other  systems are negative except as noted in the HPI and PMH.   Physical Exam Updated Vital Signs BP (!) 172/120   Pulse 72   Temp 98.2 F (36.8 C)   Resp 18   Ht 5\' 9"  (1.753 m)   Wt 66.9 kg   SpO2 99%   BMI 21.78 kg/m   Physical Exam Vitals and nursing note reviewed.  Constitutional:      General: She is not in acute distress.    Appearance: She is well-developed.  HENT:     Head: Normocephalic and atraumatic.     Comments: Resting with eyes  Mouth/Throat:     Pharynx: No oropharyngeal exudate.     Comments: No temporal artery tenderness Eyes:     Conjunctiva/sclera: Conjunctivae normal.     Pupils: Pupils are equal, round, and reactive to light.  Neck:     Comments: No meningismus. Cardiovascular:     Rate and Rhythm: Normal rate and regular rhythm.     Heart sounds: Normal heart sounds. No murmur heard. Pulmonary:     Effort: Pulmonary effort is normal. No respiratory distress.     Breath sounds: Normal breath sounds.  Abdominal:     Palpations: Abdomen is soft.     Tenderness: There is no abdominal tenderness. There is no guarding or rebound.  Musculoskeletal:        General: No tenderness. Normal range of motion.     Cervical back: Normal range of motion and neck supple.  Skin:    General: Skin is warm.  Neurological:     Mental Status: She is alert and oriented to person, place, and time.     Cranial Nerves: No cranial nerve deficit.     Motor: No abnormal muscle tone.     Coordination: Coordination normal.     Comments: CN 2-12 intact, no ataxia on finger to nose, no nystagmus, 5/5 strength throughout, no pronator drift, Romberg negative, normal gait.   Psychiatric:        Behavior: Behavior normal.    ED Results / Procedures / Treatments   Labs (all labs ordered are listed, but only abnormal results are displayed) Labs Reviewed  CBC WITH DIFFERENTIAL/PLATELET  BASIC METABOLIC PANEL  SEDIMENTATION RATE    EKG None  Radiology No results  found.  Procedures Procedures   Medications Ordered in ED Medications  metoCLOPramide (REGLAN) injection 10 mg (has no administration in time range)  diphenhydrAMINE (BENADRYL) injection 25 mg (has no administration in time range)  dexamethasone (DECADRON) injection 10 mg (has no administration in time range)  amLODipine (NORVASC) tablet 5 mg (has no administration in time range)  lisinopril (ZESTRIL) tablet 20 mg (has no administration in time range)  hydrochlorothiazide (HYDRODIURIL) tablet 25 mg (has no administration in time range)    ED Course  I have reviewed the triage vital signs and the nursing notes.  Pertinent labs & imaging results that were available during my care of the patient were reviewed by me and considered in my medical decision making (see chart for details).    MDM Rules/Calculators/A&P                          History of hypertension and severe headache.  History of cocaine abuse.  Nonfocal neurological exam.  She is a poor historian.  Has not had her blood pressure medications for several months.  Will check labs and give her blood pressure medications as well as a headache a cocktail.   CT scan is not available at this facility currently.  Will obtain MRI to rule out hemorrhage as well as aneurysm.  She denies thunderclap onset headache but states the onset and intensity is quite severe. She has no meningismus.  She has a nonfocal neurological exam.  Labs and MRI is pending at time of shift change.  Dr. Vanita Panda to assume care Final Clinical Impression(s) / ED Diagnoses Final diagnoses:  None    Rx / DC Orders ED Discharge Orders     None        Meena Barrantes, Annie Main, MD 10/20/21 [phone]

## 2022-04-24 ENCOUNTER — Encounter (HOSPITAL_COMMUNITY): Payer: Self-pay | Admitting: Emergency Medicine

## 2022-04-24 ENCOUNTER — Emergency Department (HOSPITAL_COMMUNITY)
Admission: EM | Admit: 2022-04-24 | Discharge: 2022-04-24 | Discharge status: Against Medical Advice | Payer: Medicaid Other | Attending: Emergency Medicine | Admitting: Emergency Medicine

## 2022-04-24 ENCOUNTER — Other Ambulatory Visit: Payer: Self-pay

## 2022-04-24 DIAGNOSIS — M549 Dorsalgia, unspecified: Secondary | ICD-10-CM

## 2022-04-24 DIAGNOSIS — R300 Dysuria: Secondary | ICD-10-CM | POA: Diagnosis not present

## 2022-04-24 DIAGNOSIS — Z5321 Procedure and treatment not carried out due to patient leaving prior to being seen by health care provider: Secondary | ICD-10-CM | POA: Insufficient documentation

## 2022-04-24 DIAGNOSIS — M545 Low back pain, unspecified: Secondary | ICD-10-CM | POA: Insufficient documentation

## 2022-04-24 DIAGNOSIS — R110 Nausea: Secondary | ICD-10-CM | POA: Insufficient documentation

## 2022-04-24 DIAGNOSIS — R197 Diarrhea, unspecified: Secondary | ICD-10-CM | POA: Insufficient documentation

## 2022-04-24 HISTORY — DX: Unspecified viral hepatitis C without hepatic coma: B19.20

## 2022-04-24 NOTE — ED Triage Notes (Signed)
Patient c/o mid to lower back pain that radiates into flanks bilaterally x1 week that is progressively getting worse. Denies any fevers. Per patient nausea and diarrhea. Patient concerned about kidneys. Per patient dysuria.

## 2022-04-24 NOTE — ED Notes (Signed)
Pt walked from room 11 to the charge desk, threw the phone down and stated she was going home because we were too busy. She stated she was tired of waiting and walked toward the ED exit.

## 2022-04-24 NOTE — ED Provider Notes (Signed)
Patient eloped from the emergency department prior to being evaluated.  I have not had the opportunity to evaluate this patient or participate in her medical care.   Rayna Sexton, PA-C 04/24/22 1441    Milton Ferguson, MD 04/26/22 1103

## 2022-05-03 ENCOUNTER — Emergency Department (HOSPITAL_COMMUNITY)
Admission: EM | Admit: 2022-05-03 | Discharge: 2022-05-03 | Disposition: A | Discharge status: Home / Self Care | Payer: Medicaid Other | Attending: Emergency Medicine | Admitting: Emergency Medicine

## 2022-05-03 ENCOUNTER — Other Ambulatory Visit: Payer: Self-pay

## 2022-05-03 ENCOUNTER — Encounter (HOSPITAL_COMMUNITY): Payer: Self-pay | Admitting: Emergency Medicine

## 2022-05-03 DIAGNOSIS — I10 Essential (primary) hypertension: Secondary | ICD-10-CM | POA: Insufficient documentation

## 2022-05-03 DIAGNOSIS — K047 Periapical abscess without sinus: Secondary | ICD-10-CM | POA: Diagnosis not present

## 2022-05-03 DIAGNOSIS — K0889 Other specified disorders of teeth and supporting structures: Secondary | ICD-10-CM | POA: Diagnosis present

## 2022-05-03 DIAGNOSIS — Z79899 Other long term (current) drug therapy: Secondary | ICD-10-CM | POA: Insufficient documentation

## 2022-05-03 LAB — BASIC METABOLIC PANEL
Anion gap: 9 (ref 5–15)
BUN: 11 mg/dL (ref 6–20)
CO2: 25 mmol/L (ref 22–32)
Calcium: 9.9 mg/dL (ref 8.9–10.3)
Chloride: 104 mmol/L (ref 98–111)
Creatinine, Ser: 0.71 mg/dL (ref 0.44–1.00)
GFR, Estimated: >60 mL/min (ref >60)
Glucose, Bld: 96 mg/dL (ref 70–99)
Potassium: 3.9 mmol/L (ref 3.5–5.1)
Sodium: 138 mmol/L (ref 135–145)

## 2022-05-03 MED ORDER — HYDROMORPHONE HCL 1 MG/ML IJ SOLN
1.0000 mg | Freq: Once | INTRAMUSCULAR | Status: AC
  Administered 2022-05-03: 1 mg via INTRAMUSCULAR
  Filled 2022-05-03: qty 1

## 2022-05-03 MED ORDER — HYDROCODONE-ACETAMINOPHEN 5-325 MG PO TABS: 1.0000 | ORAL_TABLET | Freq: Four times a day (QID) | ORAL | 0 refills | Status: DC | PRN

## 2022-05-03 MED ORDER — AMOXICILLIN 250 MG PO CAPS
500.0000 mg | ORAL_CAPSULE | Freq: Once | ORAL | Status: AC
  Administered 2022-05-03: 500 mg via ORAL
  Filled 2022-05-03: qty 2

## 2022-05-03 MED ORDER — ONDANSETRON 4 MG PO TBDP
4.0000 mg | ORAL_TABLET | Freq: Once | ORAL | Status: AC
  Administered 2022-05-03: 4 mg via ORAL
  Filled 2022-05-03: qty 1

## 2022-05-03 MED ORDER — AMOXICILLIN 500 MG PO CAPS: 500.0000 mg | ORAL_CAPSULE | Freq: Three times a day (TID) | ORAL | 0 refills | Status: DC

## 2022-05-03 MED ORDER — AMLODIPINE BESYLATE 5 MG PO TABS: 5.0000 mg | ORAL_TABLET | Freq: Once | ORAL | Status: DC

## 2022-05-03 NOTE — Discharge Instructions (Signed)
Complete your entire course of antibiotics as prescribed.  You  may use the hydrocodone for pain relief but do not drive within 4 hours of taking as this will make you drowsy.  Avoid applying heat or ice to this abscess area which can worsen your symptoms.  You may use warm salt water swish and spit treatment or half peroxide and water swish and spit after meals to keep this area clean as discussed.  See the dental list provided for assistance obtaining a local dentist for further management of your dental infection.

## 2022-05-03 NOTE — ED Triage Notes (Signed)
Pt having facial swelling x 2 days possible dental caries.

## 2022-05-03 NOTE — ED Provider Notes (Signed)
[facility] EMERGENCY DEPARTMENT Provider Note   CSN: [id number] Arrival date & time: 05/03/22  1318     History {Add pertinent medical, surgical, social history, OB history to HPI:1} Chief Complaint  Patient presents with   Dental Pain    Becky [name] is a 61 y.o. female with a history including alcohol and cocaine dependence, chronic pain, chronic hepatitis C, history of hypertension presenting with a 2-day history of left-sided dental pain.  She describes severe throbbing pain in her left lower molar teeth, sharp stabs of pain and cold sensitivity.  Pain radiates into her left cheek region.  She has found no alleviators for her symptoms, stating her pain is severe and worsened today than it was yesterday.  She is very tearful on exam.  She has had no medications for her pain prior to arrival.  She denies fevers or chills or difficulty swallowing.  She does not have routine dental care. '   The history is provided by the patient and a relative (Daughter at bedside).       Home Medications Prior to Admission medications   Medication Sig Start Date End Date Taking? Authorizing Provider  albuterol (PROVENTIL HFA;VENTOLIN HFA) 108 (90 Base) MCG/ACT inhaler Inhale 2 puffs into the lungs every 6 (six) hours as needed for wheezing or shortness of breath.  Patient not taking: Reported on 08/12/2021    [provider]  amLODipine (NORVASC) 5 MG tablet Take 1 tablet (5 mg total) by mouth daily. 10/20/21   Rancour, Annie Main, MD  cephALEXin (KEFLEX) 500 MG capsule Take 1 capsule (500 mg total) by mouth 4 (four) times daily. Patient not taking: Reported on 08/12/2021 01/02/19   Triplett, Tammy, PA-C  fluconazole (DIFLUCAN) 200 MG tablet Take 1 tablet p.o. as a single dose.  May take second tablet in 7 days if needed Patient not taking: Reported on 08/12/2021 01/02/19   Triplett, Tammy, PA-C  lisinopril-hydrochlorothiazide (ZESTORETIC) 20-25 MG tablet Take 1 tablet by mouth daily. 10/20/21    Rancour, Annie Main, MD  pantoprazole (PROTONIX) 40 MG tablet Take 40 mg by mouth 2 (two) times daily as needed (heartburn or indigestion).  Patient not taking: Reported on 08/12/2021    [provider]  pravastatin (PRAVACHOL) 20 MG tablet Take 20 mg by mouth daily. Patient not taking: Reported on 08/12/2021    [provider]  traZODone (DESYREL) 50 MG tablet Take 50 mg by mouth at bedtime. Patient not taking: Reported on 08/12/2021    [provider]      Allergies    Patient has no known allergies.    Review of Systems   Review of Systems  Constitutional:  Negative for fever.  HENT:  Positive for dental problem. Negative for facial swelling and sore throat.   Respiratory:  Negative for shortness of breath.   Cardiovascular:  Negative for chest pain.  Musculoskeletal:  Negative for neck pain and neck stiffness.  Neurological:  Negative for dizziness, numbness and headaches.  All other systems reviewed and are negative.   Physical Exam Updated Vital Signs BP (!) 190/129 (BP Location: Right Arm)   Pulse 73   Temp 98.1 F (36.7 C)   Resp 18   Ht 5' 9.5" (1.765 m)   Wt 63.5 kg   SpO2 98%   BMI 20.38 kg/m  Physical Exam Constitutional:      General: She is not in acute distress.    Appearance: She is well-developed.  HENT:     Head: Normocephalic  and atraumatic.     Jaw: No trismus.     Right Ear: Tympanic membrane and external ear normal.     Left Ear: Tympanic membrane and external ear normal.     Mouth/Throat:     Mouth: Mucous membranes are moist. No oral lesions.     Dentition: Dental abscesses present.     Pharynx: Oropharynx is clear. No posterior oropharyngeal erythema.  Eyes:     Conjunctiva/sclera: Conjunctivae normal.  Cardiovascular:     Rate and Rhythm: Normal rate.     Heart sounds: Normal heart sounds.  Pulmonary:     Effort: Pulmonary effort is normal.  Abdominal:     General: There is no distension.  Musculoskeletal:         General: Normal range of motion.     Cervical back: Normal range of motion and neck supple.  Lymphadenopathy:     Cervical: No cervical adenopathy.  Skin:    General: Skin is warm and dry.     Findings: No erythema.  Neurological:     Mental Status: She is alert and oriented to person, place, and time.     ED Results / Procedures / Treatments   Labs (all labs ordered are listed, but only abnormal results are displayed) Labs Reviewed - No data to display  EKG None  Radiology No results found.  Procedures Procedures  {Document cardiac monitor, telemetry assessment procedure when appropriate:1}  Medications Ordered in ED Medications  HYDROmorphone (DILAUDID) injection 1 mg (has no administration in time range)  amoxicillin (AMOXIL) capsule 500 mg (has no administration in time range)  ondansetron (ZOFRAN-ODT) disintegrating tablet 4 mg (has no administration in time range)    ED Course/ Medical Decision Making/ A&P                           Medical Decision Making Amount and/or Complexity of Data Reviewed Labs: ordered.  Risk Prescription drug management.   ***  {Document critical care time when appropriate:1} {Document review of labs and clinical decision tools ie heart score, Chads2Vasc2 etc:1}  {Document your independent review of radiology images, and any outside records:1} {Document your discussion with family members, caretakers, and with consultants:1} {Document social determinants of health affecting pt's care:1} {Document your decision making why or why not admission, treatments were needed:1} Final Clinical Impression(s) / ED Diagnoses Final diagnoses:  None    Rx / DC Orders ED Discharge Orders     None

## 2022-05-04 NOTE — ED Provider Notes (Incomplete)
[facility] EMERGENCY DEPARTMENT Provider Note   CSN: [id number] Arrival date & time: 05/03/22  1318     History {Add pertinent medical, surgical, social history, OB history to HPI:1} Chief Complaint  Patient presents with  . Dental Pain    Becky [name] is a 61 y.o. female with a history including alcohol and cocaine dependence, chronic pain, chronic hepatitis C, history of hypertension presenting with a 2-day history of left-sided dental pain.  She describes severe throbbing pain in her left lower molar teeth, sharp stabs of pain and cold sensitivity.  Pain radiates into her left cheek region.  She has found no alleviators for her symptoms, stating her pain is severe and worsened today than it was yesterday.  She is very tearful on exam.  She has had no medications for her pain prior to arrival.  She denies fevers or chills or difficulty swallowing.  She does not have routine dental care. '   The history is provided by the patient and a relative (Daughter at bedside).       Home Medications Prior to Admission medications   Medication Sig Start Date End Date Taking? Authorizing Provider  amoxicillin (AMOXIL) 500 MG capsule Take 1 capsule (500 mg total) by mouth 3 (three) times daily. 05/03/22  Yes Keltie Labell, Almyra Free, PA-C  HYDROcodone-acetaminophen (NORCO/VICODIN) 5-325 MG tablet Take 1 tablet by mouth every 6 (six) hours as needed. 05/03/22  Yes Jarad Barth, Almyra Free, PA-C  albuterol (PROVENTIL HFA;VENTOLIN HFA) 108 (90 Base) MCG/ACT inhaler Inhale 2 puffs into the lungs every 6 (six) hours as needed for wheezing or shortness of breath.  Patient not taking: Reported on 08/12/2021    [provider]  amLODipine (NORVASC) 5 MG tablet Take 1 tablet (5 mg total) by mouth daily. 10/20/21   Rancour, Annie Main, MD  fluconazole (DIFLUCAN) 200 MG tablet Take 1 tablet p.o. as a single dose.  May take second tablet in 7 days if needed Patient not taking: Reported on 08/12/2021 01/02/19   Triplett, Tammy,  PA-C  lisinopril-hydrochlorothiazide (ZESTORETIC) 20-25 MG tablet Take 1 tablet by mouth daily. 10/20/21   Rancour, Annie Main, MD  pantoprazole (PROTONIX) 40 MG tablet Take 40 mg by mouth 2 (two) times daily as needed (heartburn or indigestion).  Patient not taking: Reported on 08/12/2021    [provider]  pravastatin (PRAVACHOL) 20 MG tablet Take 20 mg by mouth daily. Patient not taking: Reported on 08/12/2021    [provider]  traZODone (DESYREL) 50 MG tablet Take 50 mg by mouth at bedtime. Patient not taking: Reported on 08/12/2021    [provider]      Allergies    Patient has no known allergies.    Review of Systems   Review of Systems  Constitutional:  Negative for fever.  HENT:  Positive for dental problem. Negative for facial swelling and sore throat.   Respiratory:  Negative for shortness of breath.   Cardiovascular:  Negative for chest pain.  Musculoskeletal:  Negative for neck pain and neck stiffness.  Neurological:  Negative for dizziness, numbness and headaches.  All other systems reviewed and are negative.   Physical Exam Updated Vital Signs BP (!) 205/117 (BP Location: Right Arm)   Pulse 83   Temp 98 F (36.7 C) (Oral)   Resp 16   Ht 5' 9.5" (1.765 m)   Wt 63.5 kg   SpO2 96%   BMI 20.38 kg/m  Physical Exam Constitutional:      General: She is not  in acute distress.    Appearance: She is well-developed.  HENT:     Head: Normocephalic and atraumatic.     Jaw: No trismus.     Right Ear: Tympanic membrane and external ear normal.     Left Ear: Tympanic membrane and external ear normal.     Mouth/Throat:     Mouth: Mucous membranes are moist. No oral lesions.     Dentition: Abnormal dentition. Dental tenderness and dental caries present. No dental abscesses.     Pharynx: Oropharynx is clear. No posterior oropharyngeal erythema.     Comments: Generalized poor dentition.  Patient has several areas of dental fracture and decay.  She  has exquisite pain to palpation along with cold sensitivity at her left lower second molar tooth.  There is mild gingival erythema and edema surrounding this tooth.  There is also subtle soft edema of her left lower cheek, there is no induration or erythema present.  Sublingual space is soft.  No difficulty swallowing.  Uvula is midline. Eyes:     Conjunctiva/sclera: Conjunctivae normal.  Cardiovascular:     Rate and Rhythm: Normal rate.     Heart sounds: Normal heart sounds.  Pulmonary:     Effort: Pulmonary effort is normal.  Musculoskeletal:        General: Normal range of motion.     Cervical back: Normal range of motion and neck supple.  Lymphadenopathy:     Cervical: No cervical adenopathy.  Skin:    General: Skin is warm and dry.     Findings: No erythema.  Neurological:     Mental Status: She is alert and oriented to person, place, and time.     ED Results / Procedures / Treatments   Labs (all labs ordered are listed, but only abnormal results are displayed) Labs Reviewed  BASIC METABOLIC PANEL    EKG None  Radiology No results found.  Procedures Procedures  {Document cardiac monitor, telemetry assessment procedure when appropriate:1}  Medications Ordered in ED Medications  HYDROmorphone (DILAUDID) injection 1 mg (1 mg Intramuscular Given 05/03/22 1551)  amoxicillin (AMOXIL) capsule 500 mg (500 mg Oral Given 05/03/22 1550)  ondansetron (ZOFRAN-ODT) disintegrating tablet 4 mg (4 mg Oral Given 05/03/22 1550)    ED Course/ Medical Decision Making/ A&P                           Medical Decision Making Patient with chronic dental issues presenting with acute left lower second molar dental pain, probable early infection, no identifiable abscess at this time.  Stressed with patient that she does need dental care, she was given referrals for this.  She was started on amoxicillin, she was having exquisite pain upon first presentation, she also had a significant elevation  in her blood pressure initially she was 190/129, I suspect this was at least partially driven by her degree of pain, however she also does have a history of hypertension and  Amount and/or Complexity of Data Reviewed Labs: ordered.  Risk Prescription drug management.     {Document critical care time when appropriate:1} {Document review of labs and clinical decision tools ie heart score, Chads2Vasc2 etc:1}  {Document your independent review of radiology images, and any outside records:1} {Document your discussion with family members, caretakers, and with consultants:1} {Document social determinants of health affecting pt's care:1} {Document your decision making why or why not admission, treatments were needed:1} Final Clinical Impression(s) / ED Diagnoses Final diagnoses:  Dental infection  Primary hypertension    Rx / DC Orders ED Discharge Orders          Ordered    amoxicillin (AMOXIL) 500 MG capsule  3 times daily        05/03/22 1809    HYDROcodone-acetaminophen (NORCO/VICODIN) 5-325 MG tablet  Every 6 hours PRN        05/03/22 1809

## 2022-05-12 ENCOUNTER — Other Ambulatory Visit: Payer: Self-pay

## 2022-05-12 ENCOUNTER — Emergency Department (HOSPITAL_COMMUNITY)
Admission: EM | Admit: 2022-05-12 | Discharge: 2022-05-12 | Disposition: A | Discharge status: Home / Self Care | Payer: Medicaid Other | Attending: Emergency Medicine | Admitting: Emergency Medicine

## 2022-05-12 DIAGNOSIS — K047 Periapical abscess without sinus: Secondary | ICD-10-CM | POA: Diagnosis not present

## 2022-05-12 DIAGNOSIS — K0889 Other specified disorders of teeth and supporting structures: Secondary | ICD-10-CM | POA: Diagnosis present

## 2022-05-12 MED ORDER — LIDOCAINE HCL (PF) 1 % IJ SOLN
5.0000 mL | Freq: Once | INTRAMUSCULAR | Status: AC
  Administered 2022-05-12: 5 mL
  Filled 2022-05-12: qty 5

## 2022-05-12 MED ORDER — OXYCODONE-ACETAMINOPHEN 5-325 MG PO TABS: 1.0000 | ORAL_TABLET | Freq: Four times a day (QID) | ORAL | 0 refills | Status: DC | PRN

## 2022-05-12 MED ORDER — LIDOCAINE VISCOUS HCL 2 % MT SOLN
15.0000 mL | Freq: Once | OROMUCOSAL | Status: AC
  Administered 2022-05-12: 15 mL via OROMUCOSAL
  Filled 2022-05-12: qty 15

## 2022-05-12 MED ORDER — OXYCODONE-ACETAMINOPHEN 5-325 MG PO TABS
2.0000 | ORAL_TABLET | Freq: Once | ORAL | Status: AC
  Administered 2022-05-12: 2 via ORAL
  Filled 2022-05-12: qty 2

## 2022-05-12 MED ORDER — CHLORHEXIDINE GLUCONATE 0.12 % MT SOLN: 15.0000 mL | Freq: Two times a day (BID) | OROMUCOSAL | 0 refills | Status: DC

## 2022-05-12 MED ORDER — OXYCODONE-ACETAMINOPHEN 5-325 MG PO TABS
1.0000 | ORAL_TABLET | Freq: Once | ORAL | Status: AC
  Administered 2022-05-12: 1 via ORAL
  Filled 2022-05-12: qty 1

## 2022-05-12 MED ORDER — AMOXICILLIN-POT CLAVULANATE 875-125 MG PO TABS: 1.0000 | ORAL_TABLET | Freq: Two times a day (BID) | ORAL | 0 refills | Status: DC

## 2022-05-12 MED ORDER — FLUCONAZOLE 150 MG PO TABS: 150.0000 mg | ORAL_TABLET | Freq: Once | ORAL | 0 refills | Status: DC

## 2022-05-12 MED ORDER — FLUCONAZOLE 150 MG PO TABS: 150.0000 mg | ORAL_TABLET | Freq: Once | ORAL | 0 refills | Status: AC

## 2022-05-12 MED ORDER — CLINDAMYCIN HCL 300 MG PO CAPS: 300.0000 mg | ORAL_CAPSULE | Freq: Three times a day (TID) | ORAL | 0 refills | Status: AC

## 2022-05-12 NOTE — ED Provider Notes (Signed)
[facility] EMERGENCY DEPARTMENT Provider Note   CSN: [id number] Arrival date & time: 05/12/22  1603     History  Chief Complaint  Patient presents with   Oral Swelling    Becky [name] is a 61 y.o. female.  Patient presents to ER chief complaint of left lower jaw swelling and pain.  She states she has poor dentition and was seen here in the ER about 10 days ago.  She finished a course of amoxicillin with some improvement.  But as she finished the course she noticed increased swelling of the left lower jaw region.  Denies any fevers denies vomiting cough or diarrhea at home complaining of persistent pain in the left lower jaw.  She states that she has a appointment with a dentist in September.       Home Medications Prior to Admission medications   Medication Sig Start Date End Date Taking? Authorizing Provider  amoxicillin-clavulanate (AUGMENTIN) 875-125 MG tablet Take 1 tablet by mouth every 12 (twelve) hours for 10 days. 05/12/22 05/22/22 Yes Luna Fuse, MD  oxyCODONE-acetaminophen (PERCOCET/ROXICET) 5-325 MG tablet Take 1 tablet by mouth every 6 (six) hours as needed for severe pain. 05/12/22  Yes Mika Anastasi, Greggory Brandy, MD  albuterol (PROVENTIL HFA;VENTOLIN HFA) 108 (90 Base) MCG/ACT inhaler Inhale 2 puffs into the lungs every 6 (six) hours as needed for wheezing or shortness of breath.  Patient not taking: Reported on 08/12/2021    [provider]  amLODipine (NORVASC) 5 MG tablet Take 1 tablet (5 mg total) by mouth daily. 10/20/21   Rancour, Annie Main, MD  amoxicillin (AMOXIL) 500 MG capsule Take 1 capsule (500 mg total) by mouth 3 (three) times daily. 05/03/22   Evalee Jefferson, PA-C  fluconazole (DIFLUCAN) 200 MG tablet Take 1 tablet p.o. as a single dose.  May take second tablet in 7 days if needed Patient not taking: Reported on 08/12/2021 01/02/19   Triplett, Tammy, PA-C  lisinopril-hydrochlorothiazide (ZESTORETIC) 20-25 MG tablet Take 1 tablet by mouth daily. 10/20/21    Rancour, Annie Main, MD  pantoprazole (PROTONIX) 40 MG tablet Take 40 mg by mouth 2 (two) times daily as needed (heartburn or indigestion).  Patient not taking: Reported on 08/12/2021    [provider]  pravastatin (PRAVACHOL) 20 MG tablet Take 20 mg by mouth daily. Patient not taking: Reported on 08/12/2021    [provider]  traZODone (DESYREL) 50 MG tablet Take 50 mg by mouth at bedtime. Patient not taking: Reported on 08/12/2021    [provider]      Allergies    Patient has no known allergies.    Review of Systems   Review of Systems  Constitutional:  Negative for fever.  HENT:  Negative for ear pain.   Eyes:  Negative for pain.  Respiratory:  Negative for cough.   Cardiovascular:  Negative for chest pain.  Gastrointestinal:  Negative for abdominal pain.  Genitourinary:  Negative for flank pain.  Musculoskeletal:  Negative for back pain.  Skin:  Negative for rash.  Neurological:  Negative for headaches.    Physical Exam Updated Vital Signs BP (!) 152/104   Pulse 71   Temp 97.6 F (36.4 C)   Resp 20   Ht '5\' 9"'$  (1.753 m)   SpO2 99%   BMI 20.67 kg/m  Physical Exam Constitutional:      General: She is not in acute distress.    Appearance: Normal appearance.  HENT:     Head: Normocephalic.  Nose: Nose normal.     Mouth/Throat:     Comments: Multiple dental cavities seen on the right and left upper and lower teeth.  However left molar lower molar/lower premolar region has a lateral gum space abscess present approximately 1 x 0.5 cm in size and tender to palpation. Eyes:     Extraocular Movements: Extraocular movements intact.  Cardiovascular:     Rate and Rhythm: Normal rate.  Pulmonary:     Effort: Pulmonary effort is normal.  Musculoskeletal:        General: Normal range of motion.     Cervical back: Normal range of motion.  Neurological:     General: No focal deficit present.     Mental Status: She is alert. Mental status is at  baseline.     ED Results / Procedures / Treatments   Labs (all labs ordered are listed, but only abnormal results are displayed) Labs Reviewed - No data to display  EKG None  Radiology No results found.  Procedures .Marland KitchenIncision and Drainage  Date/Time: 05/12/2022 5:38 PM  Performed by: Luna Fuse, MD Authorized by: Luna Fuse, MD   Comments:     Topical lidocaine viscus applied to the gum space for about 30 minutes.  25-gauge needle used to instill 1% lidocaine total of 1 cc instilled.  18-gauge needle used to try to aspirate the abscess but the patient did not tolerate this well.  Small amount of white purulent material aspirated.  Refused incision and drainage with blade.       Medications Ordered in ED Medications  lidocaine (XYLOCAINE) 2 % viscous mouth solution 15 mL (15 mLs Mouth/Throat Given 05/12/22 1636)  lidocaine (PF) (XYLOCAINE) 1 % injection 5 mL (5 mLs Infiltration Given 05/12/22 1643)  oxyCODONE-acetaminophen (PERCOCET/ROXICET) 5-325 MG per tablet 2 tablet (2 tablets Oral Given 05/12/22 1642)  oxyCODONE-acetaminophen (PERCOCET/ROXICET) 5-325 MG per tablet 1 tablet (1 tablet Oral Given 05/12/22 1740)    ED Course/ Medical Decision Making/ A&P                           Medical Decision Making Risk Prescription drug management.   Patient presents with dental abscess left lower jaw.  Written a prescription of Augmentin.  Advised outpatient follow-up with maxillofacial surgery within the week.  Advising immediate return for worsening symptoms or fevers or any additional concerns.        Final Clinical Impression(s) / ED Diagnoses Final diagnoses:  Dental abscess    Rx / DC Orders ED Discharge Orders          Ordered    amoxicillin-clavulanate (AUGMENTIN) 875-125 MG tablet  Every 12 hours        05/12/22 1747    oxyCODONE-acetaminophen (PERCOCET/ROXICET) 5-325 MG tablet  Every 6 hours PRN        05/12/22 1747              Luna Fuse, MD 05/12/22 1747

## 2022-05-12 NOTE — ED Triage Notes (Signed)
Patient came in via EMS for left side facial swelling.  Patient was seen a week ago and took the medications prescribed.  Stated that after she took medicines the swelling came back worse and is going into her neck and states she has pain in her head.

## 2022-05-12 NOTE — Discharge Instructions (Signed)
Call your primary care doctor or specialist as discussed in the next 2-3 days.   Return immediately back to the ER if:  Your symptoms worsen within the next 12-24 hours. You develop new symptoms such as new fevers, persistent vomiting, new pain, shortness of breath, or new weakness or numbness, or if you have any other concerns.  

## 2022-09-21 ENCOUNTER — Encounter (HOSPITAL_COMMUNITY): Payer: Self-pay | Admitting: Emergency Medicine

## 2022-09-21 ENCOUNTER — Other Ambulatory Visit: Payer: Self-pay

## 2022-09-21 ENCOUNTER — Emergency Department (HOSPITAL_COMMUNITY)
Admission: EM | Admit: 2022-09-21 | Discharge: 2022-09-21 | Disposition: A | Discharge status: Home / Self Care | Payer: Medicaid Other | Attending: Emergency Medicine | Admitting: Emergency Medicine

## 2022-09-21 DIAGNOSIS — R079 Chest pain, unspecified: Secondary | ICD-10-CM | POA: Diagnosis present

## 2022-09-21 DIAGNOSIS — Z5321 Procedure and treatment not carried out due to patient leaving prior to being seen by health care provider: Secondary | ICD-10-CM | POA: Insufficient documentation

## 2022-09-21 NOTE — ED Triage Notes (Signed)
Pt c/o chest pain x 2 days. Pt states she needs vinegar and baking soda for indigestion.

## 2022-09-21 NOTE — ED Notes (Signed)
Pt removing all monitoring equipment. Pt very agitated. Pt left cursing. MD aware.

## 2022-09-21 NOTE — ED Notes (Addendum)
Pt states she "just wants something to make me burp" and "I don't want no needles". Pt with ETOH on board. Pt wants a Jariel Drost-ale for her indigestion.

## 2023-11-14 ENCOUNTER — Emergency Department (HOSPITAL_COMMUNITY)
Admission: EM | Admit: 2023-11-14 | Discharge: 2023-11-14 | Discharge status: Against Medical Advice | Payer: MEDICAID | Attending: Emergency Medicine | Admitting: Emergency Medicine

## 2023-11-14 ENCOUNTER — Encounter (HOSPITAL_COMMUNITY): Payer: Self-pay

## 2023-11-14 ENCOUNTER — Other Ambulatory Visit: Payer: Self-pay

## 2023-11-14 DIAGNOSIS — Z5321 Procedure and treatment not carried out due to patient leaving prior to being seen by health care provider: Secondary | ICD-10-CM | POA: Diagnosis not present

## 2023-11-14 DIAGNOSIS — M791 Myalgia, unspecified site: Secondary | ICD-10-CM | POA: Diagnosis present

## 2023-11-14 DIAGNOSIS — R519 Headache, unspecified: Secondary | ICD-10-CM | POA: Insufficient documentation

## 2023-11-14 DIAGNOSIS — R059 Cough, unspecified: Secondary | ICD-10-CM | POA: Diagnosis not present

## 2023-11-14 NOTE — ED Triage Notes (Signed)
Per EMS  Body aches Started 2 days ago Headaches Started 2 days ago Cough

## 2024-04-07 ENCOUNTER — Encounter (HOSPITAL_COMMUNITY): Payer: Self-pay

## 2024-04-07 ENCOUNTER — Emergency Department (HOSPITAL_COMMUNITY): Payer: MEDICAID

## 2024-04-07 ENCOUNTER — Emergency Department (HOSPITAL_COMMUNITY)
Admission: EM | Admit: 2024-04-07 | Discharge: 2024-04-07 | Disposition: A | Discharge status: Home / Self Care | Payer: MEDICAID | Attending: Emergency Medicine | Admitting: Emergency Medicine

## 2024-04-07 DIAGNOSIS — H547 Unspecified visual loss: Secondary | ICD-10-CM | POA: Diagnosis not present

## 2024-04-07 DIAGNOSIS — R519 Headache, unspecified: Secondary | ICD-10-CM | POA: Diagnosis present

## 2024-04-07 DIAGNOSIS — J449 Chronic obstructive pulmonary disease, unspecified: Secondary | ICD-10-CM | POA: Diagnosis not present

## 2024-04-07 DIAGNOSIS — Z5329 Procedure and treatment not carried out because of patient's decision for other reasons: Secondary | ICD-10-CM | POA: Diagnosis not present

## 2024-04-07 DIAGNOSIS — I10 Essential (primary) hypertension: Secondary | ICD-10-CM | POA: Insufficient documentation

## 2024-04-07 DIAGNOSIS — F17210 Nicotine dependence, cigarettes, uncomplicated: Secondary | ICD-10-CM | POA: Insufficient documentation

## 2024-04-07 LAB — COMPREHENSIVE METABOLIC PANEL WITH GFR
ALT: 15 U/L (ref 0–44)
AST: 20 U/L (ref 15–41)
Albumin: 4 g/dL (ref 3.5–5.0)
Alkaline Phosphatase: 58 U/L (ref 38–126)
Anion gap: 5 (ref 5–15)
BUN: 23 mg/dL (ref 8–23)
CO2: 21 mmol/L — ABNORMAL LOW (ref 22–32)
Calcium: 9.4 mg/dL (ref 8.9–10.3)
Chloride: 109 mmol/L (ref 98–111)
Creatinine, Ser: 0.91 mg/dL (ref 0.44–1.00)
GFR, Estimated: >60 mL/min (ref >60)
Glucose, Bld: 93 mg/dL (ref 70–99)
Potassium: 4.1 mmol/L (ref 3.5–5.1)
Sodium: 135 mmol/L (ref 135–145)
Total Bilirubin: 0.7 mg/dL (ref 0.0–1.2)
Total Protein: 7.5 g/dL (ref 6.5–8.1)

## 2024-04-07 LAB — CBC WITH DIFFERENTIAL/PLATELET
Abs Immature Granulocytes: 0.02 10*3/uL (ref 0.00–0.07)
Basophils Absolute: 0.1 10*3/uL (ref 0.0–0.1)
Basophils Relative: 1 %
Eosinophils Absolute: 0.1 10*3/uL (ref 0.0–0.5)
Eosinophils Relative: 1 %
HCT: 53.8 % — ABNORMAL HIGH (ref 36.0–46.0)
Hemoglobin: 18 g/dL — ABNORMAL HIGH (ref 12.0–15.0)
Immature Granulocytes: 0 %
Lymphocytes Relative: 48 %
Lymphs Abs: 2.6 10*3/uL (ref 0.7–4.0)
MCH: 29.5 pg (ref 26.0–34.0)
MCHC: 33.5 g/dL (ref 30.0–36.0)
MCV: 88.2 fL (ref 80.0–100.0)
Monocytes Absolute: 0.5 10*3/uL (ref 0.1–1.0)
Monocytes Relative: 10 %
Neutro Abs: 2.2 10*3/uL (ref 1.7–7.7)
Neutrophils Relative %: 40 %
Platelets: 154 10*3/uL (ref 150–400)
RBC: 6.1 MIL/uL — ABNORMAL HIGH (ref 3.87–5.11)
RDW: 13.2 % (ref 11.5–15.5)
WBC: 5.4 10*3/uL (ref 4.0–10.5)
nRBC: 0 % (ref 0.0–0.2)

## 2024-04-07 LAB — SEDIMENTATION RATE: Sed Rate: 5 mm/h (ref 0–22)

## 2024-04-07 MED ORDER — METOCLOPRAMIDE HCL 5 MG/ML IJ SOLN
10.0000 mg | Freq: Once | INTRAMUSCULAR | Status: DC
  Filled 2024-04-07: qty 2

## 2024-04-07 MED ORDER — LORAZEPAM 1 MG PO TABS
1.0000 mg | ORAL_TABLET | Freq: Once | ORAL | Status: AC
  Administered 2024-04-07: 1 mg via ORAL
  Filled 2024-04-07: qty 1

## 2024-04-07 MED ORDER — DIPHENHYDRAMINE HCL 50 MG/ML IJ SOLN
25.0000 mg | Freq: Once | INTRAMUSCULAR | Status: DC
  Filled 2024-04-07: qty 1

## 2024-04-07 MED ORDER — ACETAMINOPHEN 325 MG PO TABS
650.0000 mg | ORAL_TABLET | Freq: Once | ORAL | Status: AC
  Administered 2024-04-07: 650 mg via ORAL
  Filled 2024-04-07: qty 2

## 2024-04-07 NOTE — ED Notes (Signed)
 Patient was stuck twice and upon both attempts patient moved upon IV insertion. Patent become very irate and decided that she was not going to stay and patient left AMA. Patient refused to sign paperwork. Patient was verbally abusive to Charity fundraiser, Press photographer, and another nurse. Patient is aware of risks.

## 2024-04-07 NOTE — ED Provider Notes (Signed)
 [place] EMERGENCY DEPARTMENT AT [facility] Provider Note   CSN: [id number] Arrival date & time: 04/07/24  1114     History  Chief Complaint  Patient presents with   Headache   Loss of Vision    Becky [name] is a 63 y.o. female.  She has PMH of hepatitis C, depression, alcohol use disorder, cocaine use disorder, hypertension, COPD.  Presents the ER complaining of headache to the right side.  Been gone for a week, worse in the past 2 days and yesterday noticed her right eye is very blurry, she was watching TV and when she covered up the left eye she was having trouble seeing clearly with the right eye.  She states is very tender on the side of her head on the right and pain is constant.  She denies any vision loss, no numbness ting or weakness of the extremities.  No fevers or chills or neck pain.  Denies any injury or trauma.  Patient states she smokes cigarettes and marijuana, drinks alcohol, states she has stopped using other drugs but sometimes she gets "a blunt and some crack cocaine in" accidentally.  Headache      Home Medications Prior to Admission medications   Medication Sig Start Date End Date Taking? Authorizing Provider  albuterol  (PROVENTIL  HFA;VENTOLIN  HFA) 108 (90 Base) MCG/ACT inhaler Inhale 2 puffs into the lungs every 6 (six) hours as needed for wheezing or shortness of breath.  Patient not taking: Reported on 08/12/2021    [provider]  amLODipine  (NORVASC ) 5 MG tablet Take 1 tablet (5 mg total) by mouth daily. 10/20/21   Rancour, Mara [place]in[place], MD  chlorhexidine  (PERIDEX ) 0.12 % solution Use as directed 15 mLs in the mouth or throat 2 (two) times daily. 05/12/22   Billie Budge, MD  lisinopril -hydrochlorothiazide  (ZESTORETIC ) 20-25 MG tablet Take 1 tablet by mouth daily. 10/20/21   Rancour, Mara [place]in[place], MD  oxyCODONE -acetaminophen  (PERCOCET/ROXICET) 5-325 MG tablet Take 1 tablet by mouth every 6 (six) hours as needed for severe pain. 05/12/22    Billie Budge, MD  pantoprazole  (PROTONIX ) 40 MG tablet Take 40 mg by mouth 2 (two) times daily as needed (heartburn or indigestion).  Patient not taking: Reported on 08/12/2021    [provider]  pravastatin (PRAVACHOL) 20 MG tablet Take 20 mg by mouth daily. Patient not taking: Reported on 08/12/2021    [provider]  traZODone  (DESYREL ) 50 MG tablet Take 50 mg by mouth at bedtime. Patient not taking: Reported on 08/12/2021    [provider]      Allergies    Patient has no known allergies.    Review of Systems   Review of Systems  Neurological:  Positive for headaches.    Physical Exam Updated Vital Signs BP (!) 185/136 (BP Location: Right Arm)   Pulse 62   Temp 97.8 F (36.6 C) (Oral)   Resp 20   Ht 5\' 9"  (1.753 m)   Wt 63.5 kg   SpO2 100%   BMI 20.67 kg/m  Physical Exam Vitals and nursing note reviewed.  Constitutional:      General: She is not in acute distress.    Appearance: She is well-developed.  HENT:     Head: Normocephalic and atraumatic.  Eyes:     General: No scleral icterus.    Extraocular Movements: Extraocular movements intact.     Right eye: Normal extraocular motion and no nystagmus.     Left eye: Normal extraocular motion  and no nystagmus.     Conjunctiva/sclera: Conjunctivae normal.     Pupils: Pupils are equal, round, and reactive to light. Pupils are equal.  Cardiovascular:     Rate and Rhythm: Normal rate and regular rhythm.     Heart sounds: No murmur heard. Pulmonary:     Effort: Pulmonary effort is normal. No respiratory distress.     Breath sounds: Normal breath sounds.  Abdominal:     Palpations: Abdomen is soft.     Tenderness: There is no abdominal tenderness.  Musculoskeletal:        General: No swelling.     Cervical back: Neck supple. No rigidity.  Skin:    General: Skin is warm and dry.     Capillary Refill: Capillary refill takes less than 2 seconds.  Neurological:     Mental Status: She is  alert.     GCS: GCS eye subscore is 4. GCS verbal subscore is 5. GCS motor subscore is 6.  Psychiatric:        Mood and Affect: Mood normal.     ED Results / Procedures / Treatments   Labs (all labs ordered are listed, but only abnormal results are displayed) Labs Reviewed - No data to display  EKG None  Radiology No results found.  Procedures Procedures    Medications Ordered in ED Medications - No data to display  ED Course/ Medical Decision Making/ A&P                                 Medical Decision Making Differential diagnosis includes but not limited to migraine, temporal arteritis, intracranial hemorrhage, PRES, SAH, tension headache, glaucoma, other  Course: Patient here for right-sided headache, scalp tenderness, right sided blurry vision.  Complaining all week but worse over past couple of days, does admit to cocaine use yesterday.  Noted to be hypertensive.  Vision grossly intact at bedside and funduscopic exam grossly normal.  Discussed with patient plan for labs, meds, MRI and vision testing and Tono-Pen to rule out glaucoma.  Patient was initially agreeable.  I assured her we would control her pain.  Shortly after leaving the room, patient got very agitated and was verbally abusive towards staff and dissected all of her monitors, walked out of the ED.  I was unable to speak to her about this.  Her daughter came back and apologize but stated there was no way for her to get her mother come back and.  They understand that we cannot rule out intracranial emergency and to come back for new or worsening symptoms or if they change her mind.  Amount and/or Complexity of Data Reviewed Labs: ordered.  Risk OTC drugs. Prescription drug management.           Final Clinical Impression(s) / ED Diagnoses Final diagnoses:  None    Rx / DC Orders ED Discharge Orders     None         Joshua Nieves 04/07/24 1448    Ninetta Basket,  MD 04/09/24 [phone]

## 2024-04-07 NOTE — ED Triage Notes (Addendum)
 Pt presents with onset of reduced vision to the R eye that was described as a curtain over her vision which started 2 days ago. Pt then started having severe pain on the R side of her head. Pt has tried acetaminophen  with some relief of the the pain. She denies any trouble speaking or extremity weakness.   Last used cocaine 5/17. Typically uses daily.

## 2024-04-27 ENCOUNTER — Emergency Department (HOSPITAL_COMMUNITY): Payer: MEDICAID

## 2024-04-27 ENCOUNTER — Observation Stay (HOSPITAL_COMMUNITY)
Admission: EM | Admit: 2024-04-27 | Discharge: 2024-04-29 | Disposition: A | Discharge status: Home / Self Care | Payer: MEDICAID | Attending: Internal Medicine | Admitting: Internal Medicine

## 2024-04-27 DIAGNOSIS — I10 Essential (primary) hypertension: Secondary | ICD-10-CM

## 2024-04-27 DIAGNOSIS — I161 Hypertensive emergency: Secondary | ICD-10-CM | POA: Insufficient documentation

## 2024-04-27 DIAGNOSIS — R0602 Shortness of breath: Secondary | ICD-10-CM | POA: Diagnosis present

## 2024-04-27 DIAGNOSIS — F1420 Cocaine dependence, uncomplicated: Secondary | ICD-10-CM | POA: Insufficient documentation

## 2024-04-27 DIAGNOSIS — R062 Wheezing: Secondary | ICD-10-CM

## 2024-04-27 DIAGNOSIS — F1410 Cocaine abuse, uncomplicated: Secondary | ICD-10-CM

## 2024-04-27 DIAGNOSIS — R072 Precordial pain: Secondary | ICD-10-CM

## 2024-04-27 DIAGNOSIS — R7989 Other specified abnormal findings of blood chemistry: Secondary | ICD-10-CM | POA: Insufficient documentation

## 2024-04-27 DIAGNOSIS — B182 Chronic viral hepatitis C: Secondary | ICD-10-CM | POA: Insufficient documentation

## 2024-04-27 DIAGNOSIS — F17200 Nicotine dependence, unspecified, uncomplicated: Secondary | ICD-10-CM

## 2024-04-27 DIAGNOSIS — Z720 Tobacco use: Secondary | ICD-10-CM | POA: Diagnosis present

## 2024-04-27 DIAGNOSIS — J441 Chronic obstructive pulmonary disease with (acute) exacerbation: Secondary | ICD-10-CM | POA: Diagnosis not present

## 2024-04-27 DIAGNOSIS — F17210 Nicotine dependence, cigarettes, uncomplicated: Secondary | ICD-10-CM | POA: Insufficient documentation

## 2024-04-27 LAB — BASIC METABOLIC PANEL WITH GFR
Anion gap: 8 (ref 5–15)
BUN: 19 mg/dL (ref 8–23)
CO2: 20 mmol/L — ABNORMAL LOW (ref 22–32)
Calcium: 8.5 mg/dL — ABNORMAL LOW (ref 8.9–10.3)
Chloride: 111 mmol/L (ref 98–111)
Creatinine, Ser: 0.66 mg/dL (ref 0.44–1.00)
GFR, Estimated: >60 mL/min (ref >60)
Glucose, Bld: 98 mg/dL (ref 70–99)
Potassium: 3.7 mmol/L (ref 3.5–5.1)
Sodium: 139 mmol/L (ref 135–145)

## 2024-04-27 LAB — TROPONIN I (HIGH SENSITIVITY)
Troponin I (High Sensitivity): 32 ng/L — ABNORMAL HIGH (ref <18)
Troponin I (High Sensitivity): 33 ng/L — ABNORMAL HIGH (ref <18)

## 2024-04-27 LAB — CBC
HCT: 48 % — ABNORMAL HIGH (ref 36.0–46.0)
Hemoglobin: 16.1 g/dL — ABNORMAL HIGH (ref 12.0–15.0)
MCH: 29.5 pg (ref 26.0–34.0)
MCHC: 33.5 g/dL (ref 30.0–36.0)
MCV: 87.9 fL (ref 80.0–100.0)
Platelets: 148 10*3/uL — ABNORMAL LOW (ref 150–400)
RBC: 5.46 MIL/uL — ABNORMAL HIGH (ref 3.87–5.11)
RDW: 13.6 % (ref 11.5–15.5)
WBC: 4.6 10*3/uL (ref 4.0–10.5)
nRBC: 0 % (ref 0.0–0.2)

## 2024-04-27 LAB — BRAIN NATRIURETIC PEPTIDE: B Natriuretic Peptide: 203 pg/mL — ABNORMAL HIGH (ref 0.0–100.0)

## 2024-04-27 LAB — RAPID URINE DRUG SCREEN, HOSP PERFORMED
Amphetamines: NOT DETECTED
Barbiturates: NOT DETECTED
Benzodiazepines: NOT DETECTED
Cocaine: POSITIVE — AB
Opiates: NOT DETECTED
Tetrahydrocannabinol: POSITIVE — AB

## 2024-04-27 MED ORDER — ALBUTEROL SULFATE (2.5 MG/3ML) 0.083% IN NEBU
2.5000 mg | INHALATION_SOLUTION | RESPIRATORY_TRACT | Status: DC | PRN
Start: 2024-04-27 — End: 2024-04-29

## 2024-04-27 MED ORDER — PREDNISONE 20 MG PO TABS
40.0000 mg | ORAL_TABLET | Freq: Every day | ORAL | Status: DC
  Administered 2024-04-28 – 2024-04-29 (×2): 40 mg via ORAL
  Filled 2024-04-27 (×2): qty 2

## 2024-04-27 MED ORDER — AMLODIPINE BESYLATE 5 MG PO TABS
10.0000 mg | ORAL_TABLET | Freq: Every day | ORAL | Status: DC
  Administered 2024-04-27 – 2024-04-29 (×3): 10 mg via ORAL
  Filled 2024-04-27 (×3): qty 2

## 2024-04-27 MED ORDER — AMLODIPINE BESYLATE 5 MG PO TABS
5.0000 mg | ORAL_TABLET | Freq: Once | ORAL | Status: AC
  Administered 2024-04-27: 5 mg via ORAL
  Filled 2024-04-27: qty 1

## 2024-04-27 MED ORDER — ACETAMINOPHEN 325 MG PO TABS
650.0000 mg | ORAL_TABLET | Freq: Four times a day (QID) | ORAL | Status: DC | PRN
  Administered 2024-04-27 – 2024-04-28 (×3): 650 mg via ORAL
  Filled 2024-04-27 (×3): qty 2

## 2024-04-27 MED ORDER — METHYLPREDNISOLONE SODIUM SUCC 125 MG IJ SOLR
125.0000 mg | Freq: Once | INTRAMUSCULAR | Status: AC
  Administered 2024-04-27: 125 mg via INTRAVENOUS
  Filled 2024-04-27: qty 2

## 2024-04-27 MED ORDER — ALBUTEROL SULFATE (2.5 MG/3ML) 0.083% IN NEBU
5.0000 mg | INHALATION_SOLUTION | Freq: Once | RESPIRATORY_TRACT | Status: AC
  Administered 2024-04-27: 5 mg via RESPIRATORY_TRACT
  Filled 2024-04-27: qty 6

## 2024-04-27 MED ORDER — NITROGLYCERIN 0.4 MG SL SUBL
0.4000 mg | SUBLINGUAL_TABLET | Freq: Once | SUBLINGUAL | Status: AC
  Administered 2024-04-27: 0.4 mg via SUBLINGUAL
  Filled 2024-04-27: qty 1

## 2024-04-27 MED ORDER — FOLIC ACID 1 MG PO TABS
1.0000 mg | ORAL_TABLET | Freq: Every day | ORAL | Status: DC
  Administered 2024-04-27 – 2024-04-29 (×3): 1 mg via ORAL
  Filled 2024-04-27 (×3): qty 1

## 2024-04-27 MED ORDER — IPRATROPIUM BROMIDE 0.02 % IN SOLN
0.5000 mg | Freq: Once | RESPIRATORY_TRACT | Status: AC
  Administered 2024-04-27: 0.5 mg via RESPIRATORY_TRACT
  Filled 2024-04-27: qty 2.5

## 2024-04-27 MED ORDER — PANTOPRAZOLE SODIUM 40 MG PO TBEC
40.0000 mg | DELAYED_RELEASE_TABLET | Freq: Every day | ORAL | Status: DC
  Administered 2024-04-27 – 2024-04-29 (×3): 40 mg via ORAL
  Filled 2024-04-27 (×3): qty 1

## 2024-04-27 MED ORDER — HYDRALAZINE HCL 20 MG/ML IJ SOLN
10.0000 mg | Freq: Once | INTRAMUSCULAR | Status: AC
  Administered 2024-04-27: 10 mg via INTRAVENOUS
  Filled 2024-04-27: qty 1

## 2024-04-27 MED ORDER — IPRATROPIUM-ALBUTEROL 0.5-2.5 (3) MG/3ML IN SOLN
3.0000 mL | Freq: Four times a day (QID) | RESPIRATORY_TRACT | Status: DC
  Administered 2024-04-27 – 2024-04-28 (×6): 3 mL via RESPIRATORY_TRACT
  Filled 2024-04-27 (×6): qty 3

## 2024-04-27 MED ORDER — ACETAMINOPHEN 650 MG RE SUPP
650.0000 mg | Freq: Four times a day (QID) | RECTAL | Status: DC | PRN
Start: 2024-04-27 — End: 2024-04-29

## 2024-04-27 MED ORDER — NICOTINE 21 MG/24HR TD PT24
21.0000 mg | MEDICATED_PATCH | Freq: Every day | TRANSDERMAL | Status: DC
  Administered 2024-04-27 – 2024-04-29 (×3): 21 mg via TRANSDERMAL
  Filled 2024-04-27 (×3): qty 1

## 2024-04-27 MED ORDER — HYDRALAZINE HCL 20 MG/ML IJ SOLN
5.0000 mg | INTRAMUSCULAR | Status: DC | PRN
  Administered 2024-04-27 – 2024-04-29 (×3): 5 mg via INTRAVENOUS
  Filled 2024-04-27 (×3): qty 1

## 2024-04-27 MED ORDER — DIAZEPAM 5 MG/ML IJ SOLN
2.5000 mg | Freq: Once | INTRAMUSCULAR | Status: AC
  Administered 2024-04-27: 2.5 mg via INTRAVENOUS
  Filled 2024-04-27: qty 2

## 2024-04-27 MED ORDER — GUAIFENESIN ER 600 MG PO TB12
600.0000 mg | ORAL_TABLET | Freq: Two times a day (BID) | ORAL | Status: DC
  Administered 2024-04-27 – 2024-04-29 (×4): 600 mg via ORAL
  Filled 2024-04-27 (×4): qty 1

## 2024-04-27 MED ORDER — LORAZEPAM 2 MG/ML IJ SOLN: 1.0000 mg | INTRAMUSCULAR | Status: DC | PRN

## 2024-04-27 MED ORDER — ADULT MULTIVITAMIN W/MINERALS CH
1.0000 | ORAL_TABLET | Freq: Every day | ORAL | Status: DC
  Administered 2024-04-27 – 2024-04-29 (×3): 1 via ORAL
  Filled 2024-04-27 (×3): qty 1

## 2024-04-27 MED ORDER — LORAZEPAM 1 MG PO TABS
1.0000 mg | ORAL_TABLET | ORAL | Status: DC | PRN
  Administered 2024-04-27 – 2024-04-29 (×3): 1 mg via ORAL
  Filled 2024-04-27 (×3): qty 1

## 2024-04-27 MED ORDER — THIAMINE HCL 100 MG/ML IJ SOLN
100.0000 mg | Freq: Every day | INTRAMUSCULAR | Status: DC
  Filled 2024-04-27: qty 2

## 2024-04-27 MED ORDER — HEPARIN SODIUM (PORCINE) 5000 UNIT/ML IJ SOLN
5000.0000 [IU] | Freq: Three times a day (TID) | INTRAMUSCULAR | Status: DC
  Administered 2024-04-28 (×2): 5000 [IU] via SUBCUTANEOUS
  Filled 2024-04-27 (×3): qty 1

## 2024-04-27 MED ORDER — THIAMINE MONONITRATE 100 MG PO TABS
100.0000 mg | ORAL_TABLET | Freq: Every day | ORAL | Status: DC
  Administered 2024-04-27 – 2024-04-29 (×3): 100 mg via ORAL
  Filled 2024-04-27 (×3): qty 1

## 2024-04-27 NOTE — ED Provider Notes (Signed)
 [place] [facility] AT [facility] Provider Note   CSN: [id number] Arrival date & time: 04/27/24  0757     History  Chief Complaint  Patient presents with   Shortness of Breath    Becky [name] is a 63 y.o. female.  Pt w hx copd, htn, presents indicating has felt sob in past couple days. Feels tight in chest, constant, at rest. No exertional chest pain or discomfort. No pleuritic pain. No associated nv or diaphoresis. No new or worsening cough. No fever or chills. No leg pain or swelling. Indicates is not taking any prescription meds. +smoker.   The history is provided by the patient, medical records and the EMS personnel.  Shortness of Breath Associated symptoms: no abdominal pain, no cough, no fever, no headaches, no neck pain, no rash, no sore throat and no vomiting        Home Medications Prior to Admission medications   Medication Sig Start Date End Date Taking? Authorizing Provider  albuterol  (PROVENTIL  HFA;VENTOLIN  HFA) 108 (90 Base) MCG/ACT inhaler Inhale 2 puffs into the lungs every 6 (six) hours as needed for wheezing or shortness of breath.  Patient not taking: Reported on 08/12/2021    [provider]  amLODipine  (NORVASC ) 5 MG tablet Take 1 tablet (5 mg total) by mouth daily. 10/20/21   Rancour, Mara [place]in[place], MD  chlorhexidine  (PERIDEX ) 0.12 % solution Use as directed 15 mLs in the mouth or throat 2 (two) times daily. 05/12/22   Billie Budge, MD  lisinopril -hydrochlorothiazide  (ZESTORETIC ) 20-25 MG tablet Take 1 tablet by mouth daily. 10/20/21   Rancour, Mara [place]in[place], MD  oxyCODONE -acetaminophen  (PERCOCET/ROXICET) 5-325 MG tablet Take 1 tablet by mouth every 6 (six) hours as needed for severe pain. 05/12/22   Billie Budge, MD  pantoprazole  (PROTONIX ) 40 MG tablet Take 40 mg by mouth 2 (two) times daily as needed (heartburn or indigestion).  Patient not taking: Reported on 08/12/2021    [provider]  pravastatin (PRAVACHOL) 20 MG  tablet Take 20 mg by mouth daily. Patient not taking: Reported on 08/12/2021    [provider]  traZODone  (DESYREL ) 50 MG tablet Take 50 mg by mouth at bedtime. Patient not taking: Reported on 08/12/2021    [provider]      Allergies    Patient has no known allergies.    Review of Systems   Review of Systems  Constitutional:  Negative for chills and fever.  HENT:  Negative for sore throat.   Eyes:  Negative for redness and visual disturbance.  Respiratory:  Positive for shortness of breath. Negative for cough.   Cardiovascular:  Negative for palpitations and leg swelling.  Gastrointestinal:  Negative for abdominal pain, nausea and vomiting.  Genitourinary:  Negative for dysuria and flank pain.  Musculoskeletal:  Negative for back pain and neck pain.  Skin:  Negative for rash.  Neurological:  Negative for speech difficulty, weakness, numbness and headaches.    Physical Exam Updated Vital Signs BP (!) 154/98   Pulse 79   Temp 97.7 F (36.5 C) (Oral)   Resp 15   Ht 1.753 m (5\' 9" )   Wt 63.5 kg   SpO2 92%   BMI 20.67 kg/m  Physical Exam Vitals and nursing note reviewed.  Constitutional:      Appearance: Normal appearance. She is well-developed.  HENT:     Head: Atraumatic.     Nose: Nose normal.     Mouth/Throat:     Mouth:  Mucous membranes are moist.  Eyes:     General: No scleral icterus.    Conjunctiva/sclera: Conjunctivae normal.     Pupils: Pupils are equal, round, and reactive to light.  Neck:     Trachea: No tracheal deviation.     Comments: Trachea midline. Thyroid not grossly enlarged or tender.  Cardiovascular:     Rate and Rhythm: Normal rate and regular rhythm.     Pulses: Normal pulses.     Heart sounds: Normal heart sounds. No murmur heard.    No friction rub. No gallop.  Pulmonary:     Effort: Respiratory distress present.     Breath sounds: Wheezing present.     Comments: Wheezing bil. Abdominal:     General: Bowel sounds  are normal. There is no distension.     Palpations: Abdomen is soft.     Tenderness: There is no abdominal tenderness. There is no guarding.     Comments: No bruits.  Soft, non tender.   Genitourinary:    Comments: No cva tenderness.  Musculoskeletal:        General: No swelling or tenderness.     Cervical back: Normal range of motion and neck supple. No rigidity. No muscular tenderness.     Right lower leg: No edema.     Left lower leg: No edema.  Skin:    General: Skin is warm and dry.     Findings: No rash.  Neurological:     Mental Status: She is alert.     Comments: Alert, speech normal. Motor/sens grossly intact bil.   Psychiatric:        Mood and Affect: Mood normal.     ED Results / Procedures / Treatments   Labs (all labs ordered are listed, but only abnormal results are displayed) Results for orders placed or performed during the hospital encounter of 04/27/24  Basic metabolic panel   Collection Time: 04/27/24  8:21 AM  Result Value Ref Range   Sodium 139 135 - 145 mmol/L   Potassium 3.7 3.5 - 5.1 mmol/L   Chloride 111 98 - 111 mmol/L   CO2 20 (L) 22 - 32 mmol/L   Glucose, Bld 98 70 - 99 mg/dL   BUN 19 8 - 23 mg/dL   Creatinine, Ser 5.95 0.44 - 1.00 mg/dL   Calcium 8.5 (L) 8.9 - 10.3 mg/dL   GFR, Estimated >63 >87 mL/min   Anion gap 8 5 - 15  CBC   Collection Time: 04/27/24  8:21 AM  Result Value Ref Range   WBC 4.6 4.0 - 10.5 K/uL   RBC 5.46 (H) 3.87 - 5.11 MIL/uL   Hemoglobin 16.1 (H) 12.0 - 15.0 g/dL   HCT 56.4 (H) 33.2 - 95.1 %   MCV 87.9 80.0 - 100.0 fL   MCH 29.5 26.0 - 34.0 pg   MCHC 33.5 30.0 - 36.0 g/dL   RDW 88.4 16.6 - 06.3 %   Platelets 148 (L) 150 - 400 K/uL   nRBC 0.0 0.0 - 0.2 %  Troponin I (High Sensitivity)   Collection Time: 04/27/24  8:21 AM  Result Value Ref Range   Troponin I (High Sensitivity) 32 (H) <18 ng/L  Brain natriuretic peptide   Collection Time: 04/27/24  8:24 AM  Result Value Ref Range   B Natriuretic Peptide 203.0  (H) 0.0 - 100.0 pg/mL  Rapid urine drug screen (hospital performed)   Collection Time: 04/27/24 11:56 AM  Result Value Ref Range   Opiates  NONE DETECTED NONE DETECTED   Cocaine POSITIVE (A) NONE DETECTED   Benzodiazepines NONE DETECTED NONE DETECTED   Amphetamines NONE DETECTED NONE DETECTED   Tetrahydrocannabinol POSITIVE (A) NONE DETECTED   Barbiturates NONE DETECTED NONE DETECTED  Troponin I (High Sensitivity)   Collection Time: 04/27/24 12:33 PM  Result Value Ref Range   Troponin I (High Sensitivity) 33 (H) <18 ng/L     EKG EKG Interpretation Date/Time:  Saturday April 27 2024 08:10:26 EDT Ventricular Rate:  70 PR Interval:  122 QRS Duration:  93 QT Interval:  459 QTC Calculation: 496 R Axis:   85  Text Interpretation: Sinus rhythm Nonspecific ST abnormality No significant change since last tracing Confirmed by Guadalupe Lee ([id number]) on 04/27/2024 8:32:48 AM  Radiology DG Chest 2 View Result Date: 04/27/2024 CLINICAL DATA:  Shortness of breath. EXAM: CHEST - 2 VIEW COMPARISON:  Chest radiograph dated 08/12/2021. FINDINGS: No focal consolidation, pleural effusion, or pneumothorax. Background of emphysema. The cardiac silhouette is within normal is. No acute osseous pathology. IMPRESSION: 1. No active cardiopulmonary disease. 2. Emphysema. Electronically Signed   By: Angus Bark M.D.   On: 04/27/2024 08:55    Procedures Procedures    Medications Ordered in ED Medications  diazepam (VALIUM) injection 2.5 mg (2.5 mg Intravenous Given 04/27/24 0834)  albuterol  (PROVENTIL ) (2.5 MG/3ML) 0.083% nebulizer solution 5 mg (5 mg Nebulization Given 04/27/24 0911)  nitroGLYCERIN (NITROSTAT) SL tablet 0.4 mg (0.4 mg Sublingual Given 04/27/24 0836)  hydrALAZINE  (APRESOLINE ) injection 10 mg (10 mg Intravenous Given 04/27/24 0942)  amLODipine  (NORVASC ) tablet 5 mg (5 mg Oral Given 04/27/24 1153)  methylPREDNISolone  sodium succinate (SOLU-MEDROL ) 125 mg/2 mL injection 125 mg (125 mg Intravenous  Given 04/27/24 1359)  albuterol  (PROVENTIL ) (2.5 MG/3ML) 0.083% nebulizer solution 5 mg (5 mg Nebulization Given 04/27/24 1403)  ipratropium (ATROVENT ) nebulizer solution 0.5 mg (0.5 mg Nebulization Given 04/27/24 1437)    ED Course/ Medical Decision Making/ A&P                                 Medical Decision Making Problems Addressed: Cocaine abuse [facility]): acute illness or injury with systemic symptoms that poses a threat to life or bodily functions    Details: Acute/chronic COPD exacerbation (HCC): acute illness or injury with systemic symptoms that poses a threat to life or bodily functions Precordial chest pain: acute illness or injury Shortness of breath: acute illness or injury with systemic symptoms that poses a threat to life or bodily functions Tobacco use disorder: chronic illness or injury with exacerbation, progression, or side effects of treatment that poses a threat to life or bodily functions Uncontrolled hypertension: acute illness or injury Wheezing: acute illness or injury  Amount and/or Complexity of Data Reviewed Independent Historian: EMS    Details: hx External Data Reviewed: notes. Labs: ordered. Decision-making details documented in ED Course. Radiology: ordered and independent interpretation performed. Decision-making details documented in ED Course. ECG/medicine tests: ordered and independent interpretation performed. Decision-making details documented in ED Course. Discussion of management or test interpretation with external provider(s): hospitalists  Risk Prescription drug management. Decision regarding hospitalization.   Iv ns. Continuous pulse ox and cardiac monitoring. Labs ordered/sent. Imaging ordered.   Differential diagnosis includes pna, copd, pulm edema/chf, acs, etc. Dispo decision including potential need for admission considered - will get labs and imaging and reassess.   Reviewed nursing notes and prior charts for additional history. External  reports reviewed. Additional  history from: EMS.  Recheck bp, high. Pt with chest tightness/discomfort, NTG sl. Pt w some anxiousness, reassurance provided. Valium dose iv for symptom improvement.   Cardiac monitor: sinus rhythm, rate 74.  Labs reviewed/interpreted by me - wbc normal. Hct 48. Chem w na, k normal. Cr normal. Bnp mildly elevated. UDS + cocaine, likely contributing to elevated bp/symptoms.   Albuterol  neb tx. Solumedrol iv.   Recheck bp mildly improved. No chest pain.   Recheck, bp is high. No cp. Pt has not taken any of her bp meds in 'long time'. Amlodipine  po.   Xrays reviewed/interpreted by me - no edema.   Additional labs reviewed/interpreted by me - delta trop flat/not increasing. Pt currently denies chest pain/discomfort.  Wheezing persists but improved from initial.   Additional albuterol  and atrovent  nebs.  Due to dyspnea/wheezing, pt feels needs admission. Given uncontrolled htn, chest tightness, copd exacerbation, that appears reasonable.   Hospitalists consulted for admission.  CRITICAL CARE RE: severe hypertension/uncontrolled hypertension, acute dyspnea,  copd exacerbation/wheezing, chest discomfort.  Performed by: Morgon Pamer E Darrin Apodaca Total critical care time: 45 minutes Critical care time was exclusive of separately billable procedures and treating other patients. Critical care was necessary to treat or prevent imminent or life-threatening deterioration. Critical care was time spent personally by me on the following activities: development of treatment plan with patient and/or surrogate as well as nursing, discussions with consultants, evaluation of patient's response to treatment, examination of patient, obtaining history from patient or surrogate, ordering and performing treatments and interventions, ordering and review of laboratory studies, ordering and review of radiographic studies, pulse oximetry and re-evaluation of patient's  condition.          Final Clinical Impression(s) / ED Diagnoses Final diagnoses:  None    Rx / DC Orders ED Discharge Orders     None         Guadalupe Lee, MD 04/27/24 1502

## 2024-04-27 NOTE — ED Notes (Signed)
 Report given to receiving nurse on med surg unit.

## 2024-04-27 NOTE — H&P (Signed)
 [facility] H&P   Patient Demographics:    Becky [name], is a 63 y.o. female  MRN: [id number]   DOB - 02-Aug-1961  Admit Date - 04/27/2024  Outpatient Primary MD for the patient is Pcp, No  Referring MD/NP/PA: Dr. Moses Arenas  Outpatient Specialists: Does not see PCP  Patient coming from: Home  Chief Complaint  Patient presents with   Shortness of Breath      HPI:    Becky [name]  is a 63 y.o. female, with past medical history of bipolar affective disorder, major depressive disorder, polysubstance abuse including tobacco, alcohol, THC and cocaine, hypertension, not taking any medications at home, still actively smoking and doing drugs, patient presents to ED secondary to complaints of shortness of breath over last couple days, she reports some chest tightness, constant, not exertional, related to dyspnea and cough, as well she reports dyspnea, cough, pleuritic chest pain, no nausea, no vomiting, no diaphoresis. - In ED patient was noted with significant wheezing, improved with IV steroids, nebulizer treatment, as well blood pressure was noted to be elevated at 188/124, blood pressure improved after receiving oral Norvasc  and IV hydralazine , troponin borderline at 32> 33, [facility] hospitalist consulted to admit    Review of systems:      A full 10 point Review of Systems was done, except as stated above, all other Review of Systems were negative.   With Past History of the following :    Past Medical History:  Diagnosis Date   Bipolar affective (HCC)    Bronchitis    Cocaine abuse (HCC)    COPD (chronic obstructive pulmonary disease) (HCC)    Depression    Headache    Hemorrhoids    Hepatitis C    Hypertension    MDD (major depressive disorder)       Past Surgical History:  Procedure Laterality Date   COLONOSCOPY WITH PROPOFOL  N/A 06/23/2015   SLF: 1. Rectal bleeding/pain due to  hemorrhoids2. one large and one small colorectal polyp removed.    cysts removed from armpits bilaterally     POLYPECTOMY N/A 06/23/2015   Procedure: POLYPECTOMY;  Surgeon: Alyce Jubilee, MD;  Location: [facility];  Service: Endoscopy;  Laterality: N/A;  descending colon      Social History:     Social History   Tobacco Use   Smoking status: Some Days    Current packs/day: 0.50    Types: Cigarettes   Smokeless tobacco: Never   Tobacco comments:    vague reports of smoking intermittently   Substance Use Topics   Alcohol use: Yes    Comment: 40ozs per day        Family History :     Family History  Problem Relation Age of Onset   Heart failure Mother    Coronary artery disease Mother    Heart failure Father    Coronary artery  disease Father    Colon cancer Neg Hx    Mental illness Neg Hx       Home Medications:   Prior to Admission medications   Not on File   Not taking any medications at home  Allergies:    No Known Allergies   Physical Exam:   Vitals  Blood pressure (!) 160/96, pulse 90, temperature 97.7 F (36.5 C), temperature source Oral, resp. rate 15, height 5\' 9"  (1.753 m), weight 63.5 kg, SpO2 93%.   1. General Female, laying in bed, no apparent distress  2. Normal affect and insight, Not Suicidal or Homicidal, Awake Alert, Oriented X 3.  3. No F.N deficits, ALL C.Nerves Intact, Strength 5/5 all 4 extremities, Sensation intact all 4 extremities, Plantars down going.  4. Ears and Eyes appear Normal, Conjunctivae clear, PERRLA. Moist Oral Mucosa.  5. Supple Neck, No JVD, No cervical lymphadenopathy appriciated, No Carotid Bruits.  6. Symmetrical Chest wall movement, Good air movement bilaterally, minimal scattered wheezing bilaterally  7. RRR, No Gallops, Rubs or Murmurs, No Parasternal Heave.  8. Positive Bowel Sounds, Abdomen Soft, No tenderness, No organomegaly appriciated,No rebound -guarding or rigidity.  9.  No Cyanosis, Normal Skin  Turgor, No Skin Rash or Bruise.  10. Good muscle tone,  joints appear normal , no effusions, Normal ROM.    Data Review:    CBC Recent Labs  Lab 04/27/24 0821  WBC 4.6  HGB 16.1*  HCT 48.0*  PLT 148*  MCV 87.9  MCH 29.5  MCHC 33.5  RDW 13.6   ------------------------------------------------------------------------------------------------------------------  Chemistries  Recent Labs  Lab 04/27/24 0821  NA 139  K 3.7  CL 111  CO2 20*  GLUCOSE 98  BUN 19  CREATININE 0.66  CALCIUM 8.5*   ------------------------------------------------------------------------------------------------------------------ estimated creatinine clearance is 74 mL/min (by C-G formula based on SCr of 0.66 mg/dL). ------------------------------------------------------------------------------------------------------------------ No results for input(s): "TSH", "T4TOTAL", "T3FREE", "THYROIDAB" in the last 72 hours.  Invalid input(s): "FREET3"  Coagulation profile No results for input(s): "INR", "PROTIME" in the last 168 hours. ------------------------------------------------------------------------------------------------------------------- No results for input(s): "DDIMER" in the last 72 hours. -------------------------------------------------------------------------------------------------------------------  Cardiac Enzymes No results for input(s): "CKMB", "TROPONINI", "MYOGLOBIN" in the last 168 hours.  Invalid input(s): "CK" ------------------------------------------------------------------------------------------------------------------    Component Value Date/Time   BNP 203.0 (H) 04/27/2024 0824     ---------------------------------------------------------------------------------------------------------------  Urinalysis    Component Value Date/Time   COLORURINE YELLOW 01/02/2019 0910   APPEARANCEUR HAZY (A) 01/02/2019 0910   LABSPEC 1.019 01/02/2019 0910   PHURINE 5.0  01/02/2019 0910   GLUCOSEU NEGATIVE 01/02/2019 0910   HGBUR SMALL (A) 01/02/2019 0910   BILIRUBINUR NEGATIVE 01/02/2019 0910   KETONESUR NEGATIVE 01/02/2019 0910   PROTEINUR NEGATIVE 01/02/2019 0910   UROBILINOGEN 0.2 04/30/2015 1138   NITRITE POSITIVE (A) 01/02/2019 0910   LEUKOCYTESUR LARGE (A) 01/02/2019 0910    ----------------------------------------------------------------------------------------------------------------   Imaging Results:    DG Chest 2 View Result Date: 04/27/2024 CLINICAL DATA:  Shortness of breath. EXAM: CHEST - 2 VIEW COMPARISON:  Chest radiograph dated 08/12/2021. FINDINGS: No focal consolidation, pleural effusion, or pneumothorax. Background of emphysema. The cardiac silhouette is within normal is. No acute osseous pathology. IMPRESSION: 1. No active cardiopulmonary disease. 2. Emphysema. Electronically Signed   By: Angus Bark M.D.   On: 04/27/2024 08:55    EKG: >> No significant changes when compared to EKG on 09/21/2022 Vent. rate 70 BPM PR interval 122 ms QRS duration 93 ms QT/QTcB 459/496 ms P-R-T axes 79 85  99 Sinus rhythm Nonspecific ST abnormality   Assessment & Plan:    Principal Problem:   COPD exacerbation (HCC) Active Problems:   Cocaine use disorder, severe, dependence (HCC)   Chronic hepatitis C without hepatic coma (HCC)   Tobacco abuse   COPD exacerbation -Presents with dyspnea, wheezing, x-ray significant for emphysema - Improved with IV Solu-Medrol , nebulizer treatment - Will keep on scheduled DuoNebs, as needed albuterol , and as p.o. prednisone .  Hypertensive urgency - In the setting of cocaine abuse, and medication noncompliance - With known history of hypertension, but does not take any medications as she does not follow with any physician. - Will start on scheduled amlodipine  and as needed hydralazine , will avoid beta-blockers for now given her history of cocaine use  Cocaine abuse THC abuse -she was  counseled  Alcohol abuse - Started on CIWA protocol  Elevated troponins -Non-ACS pattern, 32> 33 this is due to cocaine abuse and hypertensive emergency, no acute EKG changes  History of hepatitis C - I have instructed her to follow-up with PCP, workup in 2017 significant for positive antibodies, and CV quantitative not detected   DVT Prophylaxis Heparin  AM Labs Ordered, also please review Full Orders  Family Communication: Admission, patients condition and plan of care including tests being ordered have been discussed with the patient who indicate understanding and agree with the plan and Code Status.  Code Status Full  Likely DC to  home  Condition GUARDED    Consults called: none    Admission status: observvation  Time spent in minutes : 70 minutes   Seena Dadds M.D on 04/27/2024 at 3:51 PM   [facility] - Office  [phone]

## 2024-04-27 NOTE — ED Triage Notes (Signed)
 Patient arrived via [facility] from home with complaints of SOB, when EMS arrived she was experiencing labored breathing, 02 sats were 93% roomair and they palced her on 2L for comfort, 12 obtained by EMS and showed NSR. Patients BP with EMS was 202/145, history of HTN, patient reports she takes no medication and doesn't have a PCP.

## 2024-04-28 DIAGNOSIS — J441 Chronic obstructive pulmonary disease with (acute) exacerbation: Secondary | ICD-10-CM | POA: Diagnosis not present

## 2024-04-28 LAB — CBC
HCT: 53.2 % — ABNORMAL HIGH (ref 36.0–46.0)
Hemoglobin: 17.6 g/dL — ABNORMAL HIGH (ref 12.0–15.0)
MCH: 28.8 pg (ref 26.0–34.0)
MCHC: 33.1 g/dL (ref 30.0–36.0)
MCV: 87.1 fL (ref 80.0–100.0)
Platelets: 165 10*3/uL (ref 150–400)
RBC: 6.11 MIL/uL — ABNORMAL HIGH (ref 3.87–5.11)
RDW: 13.7 % (ref 11.5–15.5)
WBC: 8 10*3/uL (ref 4.0–10.5)
nRBC: 0 % (ref 0.0–0.2)

## 2024-04-28 LAB — BASIC METABOLIC PANEL WITH GFR
Anion gap: 11 (ref 5–15)
BUN: 18 mg/dL (ref 8–23)
CO2: 20 mmol/L — ABNORMAL LOW (ref 22–32)
Calcium: 9.7 mg/dL (ref 8.9–10.3)
Chloride: 107 mmol/L (ref 98–111)
Creatinine, Ser: 0.6 mg/dL (ref 0.44–1.00)
GFR, Estimated: >60 mL/min (ref >60)
Glucose, Bld: 127 mg/dL — ABNORMAL HIGH (ref 70–99)
Potassium: 3.6 mmol/L (ref 3.5–5.1)
Sodium: 138 mmol/L (ref 135–145)

## 2024-04-28 LAB — HIV ANTIBODY (ROUTINE TESTING W REFLEX): HIV Screen 4th Generation wRfx: NONREACTIVE

## 2024-04-28 LAB — PHOSPHORUS: Phosphorus: 3.3 mg/dL (ref 2.5–4.6)

## 2024-04-28 LAB — BRAIN NATRIURETIC PEPTIDE: B Natriuretic Peptide: 176 pg/mL — ABNORMAL HIGH (ref 0.0–100.0)

## 2024-04-28 MED ORDER — DEXTROSE-SODIUM CHLORIDE 5-0.45 % IV SOLN: INTRAVENOUS | Status: DC

## 2024-04-28 MED ORDER — LORAZEPAM 0.5 MG PO TABS
0.5000 mg | ORAL_TABLET | Freq: Once | ORAL | Status: AC
  Administered 2024-04-28: 0.5 mg via ORAL
  Filled 2024-04-28: qty 1

## 2024-04-28 MED ORDER — DIPHENHYDRAMINE HCL 50 MG/ML IJ SOLN
12.5000 mg | Freq: Once | INTRAMUSCULAR | Status: AC
  Administered 2024-04-28: 12.5 mg via INTRAVENOUS
  Filled 2024-04-28: qty 1

## 2024-04-28 MED ORDER — KETOROLAC TROMETHAMINE 15 MG/ML IJ SOLN
15.0000 mg | Freq: Once | INTRAMUSCULAR | Status: AC
  Administered 2024-04-28: 15 mg via INTRAVENOUS
  Filled 2024-04-28: qty 1

## 2024-04-28 MED ORDER — PROCHLORPERAZINE EDISYLATE 10 MG/2ML IJ SOLN
10.0000 mg | Freq: Once | INTRAMUSCULAR | Status: AC
  Administered 2024-04-28: 10 mg via INTRAVENOUS
  Filled 2024-04-28: qty 2

## 2024-04-28 NOTE — Hospital Course (Signed)
 No chest pain  Frontal headache. Some blurry vz.   Breathing is improved.= since been here.

## 2024-04-28 NOTE — Progress Notes (Signed)
 Progress Note   Patient: Becky [name] ZOX:[id number] DOB: [birth year] DOA: 04/27/2024     0 DOS: the patient was seen and examined on 04/28/2024   Brief hospital course: Per admission H&P HPI    Becky [name]  is a 63 y.o. female, with past medical history of bipolar affective disorder, major depressive disorder, polysubstance abuse including tobacco, alcohol, THC and cocaine, hypertension, not taking any medications at home, still actively smoking and doing drugs, patient presents to ED secondary to complaints of shortness of breath over last couple days, she reports some chest tightness, constant, not exertional, related to dyspnea and cough, as well she reports dyspnea, cough, pleuritic chest pain, no nausea, no vomiting, no diaphoresis. - In ED patient was noted with significant wheezing, improved with IV steroids, nebulizer treatment, as well blood pressure was noted to be elevated at 188/124, blood pressure improved after receiving oral Norvasc  and IV hydralazine , troponin borderline at 32> 33, [facility]ist consulted to admit  Assessment and Plan:  COPD exacerbation  Unclear what precipitated patient's symptoms, but she presented with dyspnea and wheezing. Her breathing is improved with neb treatments and steroids -Continue duonebs, steroids -On discharge patient will need rescue and maintenance inhalers.   Hypertensive emergency  Resolved In the setting of cocaine use. Patient p/w headache and noted to have myocardial injury on labs.  -Benzodiazepines for adrenergic stimulation  -Amlodipine  initiated this hopsitalization   Acute non ischemic myocardial injury  IN the setting of elevated BP and cocaine use. No acute EKG changes  -Nothing to do acutely   Cocaine use disorder THC use disorder  Counseled on cessation. TOC consulted   Alcohol use disorder Drinks ~3 40 oz beers daily.  Started on CIWA protocol   H/o Hepatitis C  +antibodies negative HCV RNA      Subjective:  No chest pain  Frontal headache. Some blurry vz.   Breathing is improved since being in the  hospital    Physical Exam: Vitals:   04/27/24 2033 04/27/24 2156 04/28/24 0040 04/28/24 0451  BP: 131/78  (!) 161/90 (!) 151/86  Pulse: 84  76 74  Resp: 20  18 20   Temp: 98.6 F (37 C)  98 F (36.7 C) 98.1 F (36.7 C)  TempSrc: Oral   Oral  SpO2: 93% 94% 98% 97%  Weight:      Height:       Physical Exam  Constitutional: In no distress.  Cardiovascular: Normal rate, regular rhythm. No lower extremity edema  Pulmonary: Non labored breathing on room air, no rales or wheezing  Abdominal: Soft. Normal bowel sounds. Non distended and non tender Musculoskeletal: Normal range of motion.     Neurological: Alert and oriented to person, place, and time. Non focal  Skin: Skin is warm and dry.   Data Reviewed:     Latest Ref Rng & Units 04/28/2024    8:54 AM 04/27/2024    8:21 AM 04/07/2024   12:35 PM  CBC  WBC 4.0 - 10.5 K/uL 8.0  4.6  5.4   Hemoglobin 12.0 - 15.0 g/dL 81.1  91.4  78.2   Hematocrit 36.0 - 46.0 % 53.2  48.0  53.8   Platelets 150 - 400 K/uL 165  148  154       Latest Ref Rng & Units 04/27/2024    8:21 AM 04/07/2024   12:35 PM 05/03/2022    3:44 PM  BMP  Glucose 70 - 99 mg/dL 98  93  96  BUN 8 - 23 mg/dL 19  23  11    Creatinine 0.44 - 1.00 mg/dL 9.14  7.82  9.56   Sodium 135 - 145 mmol/L 139  135  138   Potassium 3.5 - 5.1 mmol/L 3.7  4.1  3.9   Chloride 98 - 111 mmol/L 111  109  104   CO2 22 - 32 mmol/L 20  21  25    Calcium 8.9 - 10.3 mg/dL 8.5  9.4  9.9      Family Communication: Discussed with patient, and she is aware of and understands the plan.   Disposition: Status is: Observation The patient remains OBS appropriate and will d/c before 2 midnights.  Planned Discharge Destination: Home    Time spent: 35 minutes  Author: Joette Mustard, MD 04/28/2024 9:35 AM  For on call review [email or web address].

## 2024-04-28 NOTE — Plan of Care (Signed)
  Problem: Pain Managment: Goal: General experience of comfort will improve and/or be controlled Outcome: Progressing   Problem: Safety: Goal: Ability to remain free from injury will improve Outcome: Progressing

## 2024-04-29 DIAGNOSIS — J441 Chronic obstructive pulmonary disease with (acute) exacerbation: Secondary | ICD-10-CM | POA: Diagnosis not present

## 2024-04-29 LAB — BASIC METABOLIC PANEL WITH GFR
Anion gap: 8 (ref 5–15)
BUN: 20 mg/dL (ref 8–23)
CO2: 22 mmol/L (ref 22–32)
Calcium: 8.7 mg/dL — ABNORMAL LOW (ref 8.9–10.3)
Chloride: 108 mmol/L (ref 98–111)
Creatinine, Ser: 0.69 mg/dL (ref 0.44–1.00)
GFR, Estimated: >60 mL/min (ref >60)
Glucose, Bld: 91 mg/dL (ref 70–99)
Potassium: 3.4 mmol/L — ABNORMAL LOW (ref 3.5–5.1)
Sodium: 138 mmol/L (ref 135–145)

## 2024-04-29 LAB — MAGNESIUM: Magnesium: 2.1 mg/dL (ref 1.7–2.4)

## 2024-04-29 LAB — CBC
HCT: 49 % — ABNORMAL HIGH (ref 36.0–46.0)
Hemoglobin: 16.5 g/dL — ABNORMAL HIGH (ref 12.0–15.0)
MCH: 29.5 pg (ref 26.0–34.0)
MCHC: 33.7 g/dL (ref 30.0–36.0)
MCV: 87.7 fL (ref 80.0–100.0)
Platelets: 160 10*3/uL (ref 150–400)
RBC: 5.59 MIL/uL — ABNORMAL HIGH (ref 3.87–5.11)
RDW: 14 % (ref 11.5–15.5)
WBC: 8.2 10*3/uL (ref 4.0–10.5)
nRBC: 0 % (ref 0.0–0.2)

## 2024-04-29 MED ORDER — FOLIC ACID 1 MG PO TABS: 1.0000 mg | ORAL_TABLET | Freq: Every day | ORAL | 0 refills | Status: DC

## 2024-04-29 MED ORDER — FOLIC ACID 1 MG PO TABS
1.0000 mg | ORAL_TABLET | Freq: Every day | ORAL | 0 refills | Status: AC
Start: 2024-04-29 — End: 2024-05-29

## 2024-04-29 MED ORDER — PREDNISONE 20 MG PO TABS
40.0000 mg | ORAL_TABLET | Freq: Every day | ORAL | 0 refills | Status: AC
Start: 2024-04-30 — End: 2024-05-03

## 2024-04-29 MED ORDER — PANTOPRAZOLE SODIUM 40 MG PO TBEC: 40.0000 mg | DELAYED_RELEASE_TABLET | Freq: Every day | ORAL | 0 refills | Status: AC

## 2024-04-29 MED ORDER — VITAMIN B-1 100 MG PO TABS
100.0000 mg | ORAL_TABLET | Freq: Every day | ORAL | 0 refills | Status: AC
Start: 2024-04-29 — End: 2024-05-29

## 2024-04-29 MED ORDER — AMLODIPINE BESYLATE 10 MG PO TABS: 10.0000 mg | ORAL_TABLET | Freq: Every day | ORAL | 2 refills | Status: DC

## 2024-04-29 MED ORDER — ALBUTEROL SULFATE HFA 108 (90 BASE) MCG/ACT IN AERS: 2.0000 | INHALATION_SPRAY | Freq: Four times a day (QID) | RESPIRATORY_TRACT | 2 refills | Status: AC | PRN

## 2024-04-29 MED ORDER — AMLODIPINE BESYLATE 10 MG PO TABS: 10.0000 mg | ORAL_TABLET | Freq: Every day | ORAL | 2 refills | Status: AC

## 2024-04-29 MED ORDER — IPRATROPIUM-ALBUTEROL 0.5-2.5 (3) MG/3ML IN SOLN
3.0000 mL | Freq: Two times a day (BID) | RESPIRATORY_TRACT | Status: DC
  Administered 2024-04-29: 3 mL via RESPIRATORY_TRACT
  Filled 2024-04-29: qty 3

## 2024-04-29 MED ORDER — VITAMIN B-1 100 MG PO TABS: 100.0000 mg | ORAL_TABLET | Freq: Every day | ORAL | 0 refills | Status: DC

## 2024-04-29 MED ORDER — PANTOPRAZOLE SODIUM 40 MG PO TBEC: 40.0000 mg | DELAYED_RELEASE_TABLET | Freq: Every day | ORAL | 0 refills | Status: DC

## 2024-04-29 MED ORDER — MOMETASONE FURO-FORMOTEROL FUM 100-5 MCG/ACT IN AERO: 2.0000 | INHALATION_SPRAY | Freq: Every day | RESPIRATORY_TRACT | 1 refills | Status: AC

## 2024-04-29 NOTE — Discharge Instructions (Signed)

## 2024-04-29 NOTE — TOC Progression Note (Signed)
 Transition of Care [facility]) - Progression Note    Patient Details  Name: Becky [name] MRN: [id number] Date of Birth: 08-20-1961  Transition of Care [facility]) CM/SW Contact  Katrinka Parr, [place] Phone Number: 04/29/2024, 10:59 AM  Clinical Narrative:     [facility] consulted for PCP and substance use resources. CSW met with pt to discuss. CSW added resources to AVS. Informed pt to contact her insurance if she needs further assistance accessing resources as she has a tailored plan. Pt had no additional questions or concerns.   Expected Discharge Plan: Home/Self Care Barriers to Discharge: No Barriers Identified  Expected Discharge Plan and Services         Expected Discharge Date: 04/29/24                                     Social Determinants of Health (SDOH) Interventions SDOH Screenings   Housing: Low Risk  (04/28/2024)  Alcohol Screen: Medium Risk (09/28/2017)  Tobacco Use: High Risk (11/14/2023)    Readmission Risk Interventions     No data to display

## 2024-04-29 NOTE — Discharge Summary (Signed)
 Physician Discharge Summary  Becky [name]:[id number] DOB: 1961/10/16 DOA: 04/27/2024  PCP: Vicente Graham, No  Admit date: 04/27/2024  Discharge date: 04/29/2024  Admitted From:Home  Disposition:  Home  Recommendations for Outpatient Follow-up:  Follow up with PCP in 1-2 weeks Continue on amlodipine  for blood pressure control as ordered Prednisone  taper as prescribed Continue inhalers as prescribed Counseled on alcohol and drug cessation  Home Health: None  Equipment/Devices: None  Discharge Condition:Stable  CODE STATUS: Full  Diet recommendation: Heart Healthy  Brief/Interim Summary: Becky [name]  is a 63 y.o. female, with past medical history of bipolar affective disorder, major depressive disorder, polysubstance abuse including tobacco, alcohol, THC and cocaine, hypertension, not taking any medications at home, still actively smoking and doing drugs, patient presents to ED secondary to complaints of shortness of breath over last couple days, she reports some chest tightness, constant, not exertional, related to dyspnea and cough, as well she reports dyspnea, cough, pleuritic chest pain, no nausea, no vomiting, no diaphoresis.   Patient was admitted for acute COPD exacerbation as well as hypertensive crisis which has improved.  She is no longer short of breath and eager for discharge.  Blood pressures appear to be improving with the use of amlodipine , but she still remains on some prednisone  which will likely keep her blood pressures elevated for the short-term.  She has also been counseled on cessation of cocaine use.  No other acute events or concerns noted throughout the course of this admission and she is now in stable condition for discharge and medications as prescribed.  Discharge Diagnoses:  Principal Problem:   COPD exacerbation (HCC) Active Problems:   Cocaine use disorder, severe, dependence (HCC)   Chronic hepatitis C without hepatic coma (HCC)   Tobacco abuse  Principal  discharge diagnosis: Acute COPD exacerbation along with hypertensive crisis in the setting of cocaine use.  Discharge Instructions  Discharge Instructions     Diet - low sodium heart healthy   Complete by: As directed    Increase activity slowly   Complete by: As directed       Allergies as of 04/29/2024   No Known Allergies      Medication List     TAKE these medications    albuterol  108 (90 Base) MCG/ACT inhaler Commonly known as: VENTOLIN  HFA Inhale 2 puffs into the lungs every 6 (six) hours as needed for wheezing or shortness of breath.   amLODipine  10 MG tablet Commonly known as: NORVASC  Take 1 tablet (10 mg total) by mouth daily.   folic acid  1 MG tablet Commonly known as: FOLVITE  Take 1 tablet (1 mg total) by mouth daily.   mometasone-formoterol 100-5 MCG/ACT Aero Commonly known as: DULERA Inhale 2 puffs into the lungs daily.   pantoprazole  40 MG tablet Commonly known as: PROTONIX  Take 1 tablet (40 mg total) by mouth daily for 3 days.   thiamine  100 MG tablet Commonly known as: Vitamin B-1 Take 1 tablet (100 mg total) by mouth daily.        No Known Allergies  Consultations: None   Procedures/Studies: DG Chest 2 View Result Date: 04/27/2024 CLINICAL DATA:  Shortness of breath. EXAM: CHEST - 2 VIEW COMPARISON:  Chest radiograph dated 08/12/2021. FINDINGS: No focal consolidation, pleural effusion, or pneumothorax. Background of emphysema. The cardiac silhouette is within normal is. No acute osseous pathology. IMPRESSION: 1. No active cardiopulmonary disease. 2. Emphysema. Electronically Signed   By: Angus Bark M.D.   On: 04/27/2024 08:55  Discharge Exam: Vitals:   04/29/24 0831 04/29/24 1014  BP: (!) 167/117 (!) 146/102  Pulse: 69 79  Resp:    Temp: 97.8 F (36.6 C) 98.3 F (36.8 C)  SpO2: 100% 98%   Vitals:   04/29/24 0123 04/29/24 0446 04/29/24 0831 04/29/24 1014  BP: 128/84  (!) 167/117 (!) 146/102  Pulse: 74 61 69 79  Resp:       Temp:  97.9 F (36.6 C) 97.8 F (36.6 C) 98.3 F (36.8 C)  TempSrc:  Oral Oral Oral  SpO2:  99% 100% 98%  Weight:      Height:        General: Pt is alert, awake, not in acute distress Cardiovascular: RRR, S1/S2 +, no rubs, no gallops Respiratory: CTA bilaterally, no wheezing, no rhonchi Abdominal: Soft, NT, ND, bowel sounds + Extremities: no edema, no cyanosis    The results of significant diagnostics from this hospitalization (including imaging, microbiology, ancillary and laboratory) are listed below for reference.     Microbiology: No results found for this or any previous visit (from the past 240 hours).   Labs: BNP (last 3 results) Recent Labs    04/27/24 0824 04/28/24 0854  BNP 203.0* 176.0*   Basic Metabolic Panel: Recent Labs  Lab 04/27/24 0821 04/28/24 0854 04/29/24 0512  NA 139 138 138  K 3.7 3.6 3.4*  CL 111 107 108  CO2 20* 20* 22  GLUCOSE 98 127* 91  BUN 19 18 20   CREATININE 0.66 0.60 0.69  CALCIUM 8.5* 9.7 8.7*  MG  --   --  2.1  PHOS  --  3.3  --    Liver Function Tests: No results for input(s): "AST", "ALT", "ALKPHOS", "BILITOT", "PROT", "ALBUMIN" in the last 168 hours. No results for input(s): "LIPASE", "AMYLASE" in the last 168 hours. No results for input(s): "AMMONIA" in the last 168 hours. CBC: Recent Labs  Lab 04/27/24 0821 04/28/24 0854 04/29/24 0512  WBC 4.6 8.0 8.2  HGB 16.1* 17.6* 16.5*  HCT 48.0* 53.2* 49.0*  MCV 87.9 87.1 87.7  PLT 148* 165 160   Cardiac Enzymes: No results for input(s): "CKTOTAL", "CKMB", "CKMBINDEX", "TROPONINI" in the last 168 hours. BNP: Invalid input(s): "POCBNP" CBG: No results for input(s): "GLUCAP" in the last 168 hours. D-Dimer No results for input(s): "DDIMER" in the last 72 hours. Hgb A1c No results for input(s): "HGBA1C" in the last 72 hours. Lipid Profile No results for input(s): "CHOL", "HDL", "LDLCALC", "TRIG", "CHOLHDL", "LDLDIRECT" in the last 72 hours. Thyroid function  studies No results for input(s): "TSH", "T4TOTAL", "T3FREE", "THYROIDAB" in the last 72 hours.  Invalid input(s): "FREET3" Anemia work up No results for input(s): "VITAMINB12", "FOLATE", "FERRITIN", "TIBC", "IRON", "RETICCTPCT" in the last 72 hours. Urinalysis    Component Value Date/Time   COLORURINE YELLOW 01/02/2019 0910   APPEARANCEUR HAZY (A) 01/02/2019 0910   LABSPEC 1.019 01/02/2019 0910   PHURINE 5.0 01/02/2019 0910   GLUCOSEU NEGATIVE 01/02/2019 0910   HGBUR SMALL (A) 01/02/2019 0910   BILIRUBINUR NEGATIVE 01/02/2019 0910   KETONESUR NEGATIVE 01/02/2019 0910   PROTEINUR NEGATIVE 01/02/2019 0910   UROBILINOGEN 0.2 04/30/2015 1138   NITRITE POSITIVE (A) 01/02/2019 0910   LEUKOCYTESUR LARGE (A) 01/02/2019 0910   Sepsis Labs Recent Labs  Lab 04/27/24 0821 04/28/24 0854 04/29/24 0512  WBC 4.6 8.0 8.2   Microbiology No results found for this or any previous visit (from the past 240 hours).   Time coordinating discharge: 35 minutes  SIGNED:  Tiarrah Saville D Mason Sole, DO [facility] 04/29/2024, 10:33 AM  If 7PM-7AM, please contact night-coverage [email or web address]

## 2025-03-04 ENCOUNTER — Ambulatory Visit: Payer: Self-pay
